# Patient Record
Sex: Female | Born: 1955 | Race: White | Hispanic: No | Smoking: Current every day smoker
Health system: Southern US, Community
[De-identification: ages and names within clinical notes are randomized; demographics above are authoritative.]

## PROBLEM LIST (undated history)

## (undated) DIAGNOSIS — E119 Type 2 diabetes mellitus without complications: Secondary | ICD-10-CM

## (undated) DIAGNOSIS — F329 Major depressive disorder, single episode, unspecified: Secondary | ICD-10-CM

## (undated) DIAGNOSIS — E039 Hypothyroidism, unspecified: Secondary | ICD-10-CM

## (undated) DIAGNOSIS — F32A Depression, unspecified: Secondary | ICD-10-CM

## (undated) DIAGNOSIS — I10 Essential (primary) hypertension: Secondary | ICD-10-CM

## (undated) DIAGNOSIS — I2510 Atherosclerotic heart disease of native coronary artery without angina pectoris: Secondary | ICD-10-CM

## (undated) DIAGNOSIS — I779 Disorder of arteries and arterioles, unspecified: Secondary | ICD-10-CM

## (undated) DIAGNOSIS — J449 Chronic obstructive pulmonary disease, unspecified: Secondary | ICD-10-CM

## (undated) DIAGNOSIS — I739 Peripheral vascular disease, unspecified: Secondary | ICD-10-CM

## (undated) DIAGNOSIS — E785 Hyperlipidemia, unspecified: Secondary | ICD-10-CM

## (undated) HISTORY — DX: Essential (primary) hypertension: I10

## (undated) HISTORY — DX: Major depressive disorder, single episode, unspecified: F32.9

## (undated) HISTORY — DX: Hypothyroidism, unspecified: E03.9

## (undated) HISTORY — DX: Type 2 diabetes mellitus without complications: E11.9

## (undated) HISTORY — DX: Chronic obstructive pulmonary disease, unspecified: J44.9

## (undated) HISTORY — PX: TYMPANOPLASTY: SHX33

## (undated) HISTORY — DX: Atherosclerotic heart disease of native coronary artery without angina pectoris: I25.10

## (undated) HISTORY — PX: CARPAL TUNNEL RELEASE: SHX101

## (undated) HISTORY — DX: Disorder of arteries and arterioles, unspecified: I77.9

## (undated) HISTORY — PX: CYSTECTOMY: SUR359

## (undated) HISTORY — DX: Hyperlipidemia, unspecified: E78.5

## (undated) HISTORY — DX: Peripheral vascular disease, unspecified: I73.9

## (undated) HISTORY — DX: Depression, unspecified: F32.A

---

## 2002-07-18 HISTORY — PX: TOTAL ABDOMINAL HYSTERECTOMY: SHX209

## 2010-01-04 ENCOUNTER — Emergency Department (HOSPITAL_COMMUNITY): Admission: EM | Admit: 2010-01-04 | Discharge: 2010-01-04 | Payer: Self-pay | Admitting: Emergency Medicine

## 2010-01-05 ENCOUNTER — Encounter: Payer: Self-pay | Admitting: Cardiology

## 2010-01-08 ENCOUNTER — Ambulatory Visit: Payer: Self-pay | Admitting: Cardiology

## 2010-08-17 NOTE — Letter (Signed)
Summary: External Correspondence/ [facility]ACT[facility]  External Correspondence/ [facility]   Imported By: Dorise Hiss 01/08/2010 11:03:03  _____________________________________________________________________  External Attachment:    Type:   Image     Comment:   External Document

## 2010-10-03 LAB — COMPREHENSIVE METABOLIC PANEL
ALT: 19 U/L (ref 0–35)
AST: 22 U/L (ref 0–37)
Albumin: 4.1 g/dL (ref 3.5–5.2)
Alkaline Phosphatase: 51 U/L (ref 39–117)
BUN: 18 mg/dL (ref 6–23)
CO2: 23 mEq/L (ref 19–32)
Calcium: 10.4 mg/dL (ref 8.4–10.5)
Chloride: 103 mEq/L (ref 96–112)
Creatinine, Ser: 0.91 mg/dL (ref 0.4–1.2)
GFR calc Af Amer: >60 mL/min (ref >60)
GFR calc non Af Amer: >60 mL/min (ref >60)
Glucose, Bld: 130 mg/dL — ABNORMAL HIGH (ref 70–99)
Potassium: 4.3 mEq/L (ref 3.5–5.1)
Sodium: 137 mEq/L (ref 135–145)
Total Bilirubin: 0.3 mg/dL (ref 0.3–1.2)
Total Protein: 7.4 g/dL (ref 6.0–8.3)

## 2010-10-03 LAB — URINALYSIS, ROUTINE W REFLEX MICROSCOPIC
Bilirubin Urine: NEGATIVE
Glucose, UA: NEGATIVE mg/dL
Ketones, ur: NEGATIVE mg/dL
Leukocytes, UA: NEGATIVE
Nitrite: NEGATIVE
Specific Gravity, Urine: 1.01 (ref 1.005–1.030)
Urobilinogen, UA: 0.2 mg/dL (ref 0.0–1.0)
pH: 5.5 (ref 5.0–8.0)

## 2010-10-03 LAB — CBC
HCT: 35.3 % — ABNORMAL LOW (ref 36.0–46.0)
Hemoglobin: 12 g/dL (ref 12.0–15.0)
MCHC: 33.9 g/dL (ref 30.0–36.0)
MCV: 89.2 fL (ref 78.0–100.0)
Platelets: 299 10*3/uL (ref 150–400)
RBC: 3.96 MIL/uL (ref 3.87–5.11)
RDW: 14 % (ref 11.5–15.5)
WBC: 8.9 10*3/uL (ref 4.0–10.5)

## 2010-10-03 LAB — DIFFERENTIAL
Basophils Absolute: 0 10*3/uL (ref 0.0–0.1)
Basophils Relative: 0 % (ref 0–1)
Eosinophils Absolute: 0.1 10*3/uL (ref 0.0–0.7)
Eosinophils Relative: 1 % (ref 0–5)
Lymphocytes Relative: 39 % (ref 12–46)
Lymphs Abs: 3.5 10*3/uL (ref 0.7–4.0)
Monocytes Absolute: 0.6 10*3/uL (ref 0.1–1.0)
Monocytes Relative: 6 % (ref 3–12)
Neutro Abs: 4.7 10*3/uL (ref 1.7–7.7)
Neutrophils Relative %: 53 % (ref 43–77)

## 2010-10-03 LAB — GLUCOSE, CAPILLARY: Glucose-Capillary: 162 mg/dL — ABNORMAL HIGH (ref 70–99)

## 2010-10-03 LAB — URINE MICROSCOPIC-ADD ON

## 2011-04-28 ENCOUNTER — Other Ambulatory Visit (HOSPITAL_COMMUNITY): Payer: Self-pay | Admitting: Neurology

## 2011-04-28 DIAGNOSIS — I639 Cerebral infarction, unspecified: Secondary | ICD-10-CM

## 2011-05-02 ENCOUNTER — Other Ambulatory Visit (HOSPITAL_COMMUNITY): Payer: Self-pay | Admitting: Radiology

## 2011-05-27 ENCOUNTER — Encounter: Payer: Self-pay | Admitting: *Deleted

## 2011-05-31 ENCOUNTER — Encounter: Payer: Self-pay | Admitting: Cardiology

## 2011-05-31 ENCOUNTER — Ambulatory Visit: Payer: Self-pay | Admitting: Cardiology

## 2011-06-01 ENCOUNTER — Encounter: Payer: Self-pay | Admitting: Cardiology

## 2012-07-18 DIAGNOSIS — I639 Cerebral infarction, unspecified: Secondary | ICD-10-CM

## 2012-07-18 HISTORY — DX: Cerebral infarction, unspecified: I63.9

## 2017-01-15 DIAGNOSIS — R1084 Generalized abdominal pain: Secondary | ICD-10-CM | POA: Insufficient documentation

## 2017-06-19 DIAGNOSIS — F411 Generalized anxiety disorder: Secondary | ICD-10-CM | POA: Insufficient documentation

## 2017-06-19 DIAGNOSIS — F17200 Nicotine dependence, unspecified, uncomplicated: Secondary | ICD-10-CM | POA: Insufficient documentation

## 2017-06-25 DIAGNOSIS — E785 Hyperlipidemia, unspecified: Secondary | ICD-10-CM | POA: Insufficient documentation

## 2017-08-01 DIAGNOSIS — I10 Essential (primary) hypertension: Secondary | ICD-10-CM | POA: Insufficient documentation

## 2017-08-01 DIAGNOSIS — Z681 Body mass index (BMI) 19 or less, adult: Secondary | ICD-10-CM

## 2017-08-01 DIAGNOSIS — K219 Gastro-esophageal reflux disease without esophagitis: Secondary | ICD-10-CM | POA: Insufficient documentation

## 2017-08-01 DIAGNOSIS — E1122 Type 2 diabetes mellitus with diabetic chronic kidney disease: Secondary | ICD-10-CM | POA: Insufficient documentation

## 2017-08-01 DIAGNOSIS — E119 Type 2 diabetes mellitus without complications: Secondary | ICD-10-CM | POA: Insufficient documentation

## 2018-05-08 DIAGNOSIS — M533 Sacrococcygeal disorders, not elsewhere classified: Secondary | ICD-10-CM | POA: Insufficient documentation

## 2018-05-08 DIAGNOSIS — R300 Dysuria: Secondary | ICD-10-CM | POA: Insufficient documentation

## 2018-05-08 DIAGNOSIS — M549 Dorsalgia, unspecified: Secondary | ICD-10-CM | POA: Insufficient documentation

## 2018-06-27 ENCOUNTER — Ambulatory Visit (INDEPENDENT_AMBULATORY_CARE_PROVIDER_SITE_OTHER): Payer: Medicare HMO | Admitting: Orthopaedic Surgery

## 2018-06-27 ENCOUNTER — Ambulatory Visit (INDEPENDENT_AMBULATORY_CARE_PROVIDER_SITE_OTHER): Payer: Medicare HMO

## 2018-06-27 ENCOUNTER — Encounter (INDEPENDENT_AMBULATORY_CARE_PROVIDER_SITE_OTHER): Payer: Self-pay | Admitting: Orthopaedic Surgery

## 2018-06-27 VITALS — BP 122/82 | HR 108 | Ht 63.5 in | Wt 99.0 lb

## 2018-06-27 DIAGNOSIS — G8929 Other chronic pain: Secondary | ICD-10-CM | POA: Diagnosis not present

## 2018-06-27 DIAGNOSIS — M5442 Lumbago with sciatica, left side: Secondary | ICD-10-CM | POA: Diagnosis not present

## 2018-06-27 NOTE — Progress Notes (Signed)
Office Visit Note   Patient: Ashlee Austin           Date of Birth: Sep 11, 1955           MRN: [id number] Visit Date: 06/27/2018              Requested by: Celedonio Savage, MD [address] [place], [place] [address] PCP: Celedonio Savage, MD   Assessment & Plan: Visit Diagnoses:  1. Chronic bilateral low back pain with left-sided sciatica     Plan: Chronic low back pain with evidence of left lower extremity radiculopathy will obtain an MRI scan turn to the office afterwards for consideration of treatment  Follow-Up Instructions: Return after MRI L-S spine.   Orders:  Orders Placed This Encounter  Procedures  . XR Lumbar Spine 2-3 Views   No orders of the defined types were placed in this encounter.     Procedures: No procedures performed   Clinical Data: No additional findings.   Subjective: Chief Complaint  Patient presents with  . Left Hip - Pain  . Lower Back - Pain    Left SI joint pain  Ashlee Austin visits the office with a long history of low back pain associated with left lower extremity radiculopathy.  She is had trouble for "years".  She is been treated with prednisone Flexeril for amatory medicines she notes that her pain originates in the lower lumbar spine to the left and they radiates into her left leg as far distally as her ankle and her foot.  She has been a type II diabetic for years tries to maintain good control.  She has not had any problem with her right lower extremity.  Office notes from her primary care physician are reviewed  HPI  Review of Systems   Objective: Vital Signs: BP 122/82   Pulse (!) 108   Ht 5' 3.5" (1.613 m)   Wt 99 lb (44.9 kg)   BMI 17.26 kg/m   Physical Exam  Constitutional: She is oriented to person, place, and time. She appears well-developed and well-nourished.  HENT:  Mouth/Throat: Oropharynx is clear and moist.  Eyes: Pupils are equal, round, and reactive to light. EOM are normal.  Pulmonary/Chest: Effort normal.    Neurological: She is alert and oriented to person, place, and time.  Skin: Skin is warm and dry.  Psychiatric: She has a normal mood and affect. Her behavior is normal.    Ortho Exam awake alert and oriented x3.  Straight leg raise with some minimal back discomfort.  No leg pain.  Reflexes were symmetrical.  Motor exam intact.  Painless range of motion both hips and both knees.  Has an area of tenderness the left paralumbar region the area of the sacroiliac joint but skin was intact no percussible tenderness of the lumbar spine.  Some pain with forward flexion extension of the lumbar spine but no referred pain to either extremity with that maneuver  Specialty Comments:  No specialty comments available.  Imaging: Xr Lumbar Spine 2-3 Views  Result Date: 06/27/2018 Films of the lumbar spine were obtained in the AP and lateral projection.  Considerable bowel gas obliterate some of the detail.  Diffuse calcification of the abdominal aorta without obvious aneurysmal dilatation.  Very slight anterior listhesis of L4 on L5.  Disc spaces are well-maintained.  Some facet sclerosis at L4-5 and L5-S1 minimal right-sided curvature in the lower lumbar spine probably degenerative in nature    PMFS History: Patient Active  Problem List   Diagnosis Date Noted  . Chronic bilateral low back pain with left-sided sciatica 06/27/2018   Past Medical History:  Diagnosis Date  . Carotid artery disease (HCC)    Mild - nonobstructive 10/12  . COPD (chronic obstructive pulmonary disease) ([place])   . Coronary atherosclerosis of native coronary artery    [facility]; negative Dobutamine echo 6/11  . Depression   . Essential hypertension, benign   . Hyperlipidemia   . Hypothyroidism   . Type 2 diabetes mellitus (HCC)     Family History  Problem Relation Age of Onset  . Coronary artery disease Mother        MI age 51  . Lung cancer Father     Past Surgical History:  Procedure Laterality Date  . CYSTECTOMY      Spine  . TOTAL ABDOMINAL HYSTERECTOMY  2004  . TYMPANOPLASTY     Social History   Occupational History    Employer: [employer]    Comment: Full time at [employer]  Tobacco Use  . Smoking status: Current Every Day Smoker    Packs/day: 1.00    Years: 16.00    Pack years: 16.00    Types: Cigarettes  . Smokeless tobacco: Never Used  Substance and Sexual Activity  . Alcohol use: No  . Drug use: No  . Sexual activity: Not on file     Garald Balding, MD   Note - This record has been created using [employer].  Chart creation errors have been sought, but may not always  have been located. Such creation errors do not reflect on  the standard of medical care.

## 2018-06-27 NOTE — Addendum Note (Signed)
Addended by: Meyer Cory on: 06/27/2018 02:55 PM   Modules accepted: Orders

## 2018-06-27 NOTE — Progress Notes (Deleted)
   Office Visit Note   Patient: Ashlee Austin           Date of Birth: 08-Sep-1955           MRN: [id number] Visit Date: 06/27/2018              Requested by: Celedonio Savage, MD [address] [place], [place] [address] PCP: Celedonio Savage, MD   Assessment & Plan: Visit Diagnoses:  1. Chronic bilateral low back pain with left-sided sciatica     Plan: ***  Follow-Up Instructions: No follow-ups on file.   Orders:  Orders Placed This Encounter  Procedures  . XR Lumbar Spine 2-3 Views   No orders of the defined types were placed in this encounter.     Procedures: No procedures performed   Clinical Data: No additional findings.   Subjective: Chief Complaint  Patient presents with  . Left Hip - Pain  . Lower Back - Pain    Left SI joint pain  Patient presents with left anterior thigh pain and left SI joint pain. She states this is a chronic pain and is about 8/10 on the pain scale. She describes it as continuous.  She has no known injury. She is taking naproxen and flexeril. She also tries hot baths, ben gay, heat, and exercises.  HPI  Review of Systems   Objective: Vital Signs: BP 122/82   Pulse (!) 108   Ht 5' 3.5" (1.613 m)   Wt 99 lb (44.9 kg)   BMI 17.26 kg/m   Physical Exam  Ortho Exam  Specialty Comments:  No specialty comments available.  Imaging: No results found.   PMFS History: Patient Active Problem List   Diagnosis Date Noted  . Chronic bilateral low back pain with left-sided sciatica 06/27/2018   Past Medical History:  Diagnosis Date  . Carotid artery disease (HCC)    Mild - nonobstructive 10/12  . COPD (chronic obstructive pulmonary disease) ([place])   . Coronary atherosclerosis of native coronary artery    [facility]; negative Dobutamine echo 6/11  . Depression   . Essential hypertension, benign   . Hyperlipidemia   . Hypothyroidism   . Type 2 diabetes mellitus (HCC)     Family History  Problem Relation Age of Onset  . Lung  cancer Father   . Coronary artery disease Mother        MI age 60    Past Surgical History:  Procedure Laterality Date  . CYSTECTOMY     Spine  . TOTAL ABDOMINAL HYSTERECTOMY  2004  . TYMPANOPLASTY     Social History   Occupational History    Employer: [employer]    Comment: Full time at [employer]  Tobacco Use  . Smoking status: Current Every Day Smoker    Packs/day: 1.00    Years: 16.00    Pack years: 16.00    Types: Cigarettes  . Smokeless tobacco: Never Used  Substance and Sexual Activity  . Alcohol use: No  . Drug use: No  . Sexual activity: Not on file

## 2018-07-03 ENCOUNTER — Encounter: Payer: Self-pay | Admitting: Orthopaedic Surgery

## 2018-07-05 ENCOUNTER — Ambulatory Visit (INDEPENDENT_AMBULATORY_CARE_PROVIDER_SITE_OTHER): Payer: Medicare HMO | Admitting: Orthopaedic Surgery

## 2018-07-05 ENCOUNTER — Encounter (INDEPENDENT_AMBULATORY_CARE_PROVIDER_SITE_OTHER): Payer: Self-pay | Admitting: Orthopaedic Surgery

## 2018-07-05 VITALS — BP 108/70 | HR 104 | Ht 63.5 in | Wt 99.0 lb

## 2018-07-05 DIAGNOSIS — M5126 Other intervertebral disc displacement, lumbar region: Secondary | ICD-10-CM | POA: Diagnosis not present

## 2018-07-05 DIAGNOSIS — M5442 Lumbago with sciatica, left side: Secondary | ICD-10-CM

## 2018-07-05 DIAGNOSIS — G8929 Other chronic pain: Secondary | ICD-10-CM | POA: Diagnosis not present

## 2018-07-05 NOTE — Progress Notes (Signed)
Office Visit Note   Patient: Ashlee Austin           Date of Birth: [birth year]           MRN: [id number] Visit Date: 07/05/2018              Requested by: Celedonio Savage, MD [address] [place], [place] [address] PCP: Celedonio Savage, MD   Assessment & Plan: Visit Diagnoses:  1. Chronic bilateral low back pain with left-sided sciatica   2. Protrusion of lumbar intervertebral disc     Plan: I reviewed the scan with the patient most prominent is L4-5 left extraforaminal protrusion.  We will set up for epidural at that level and possibly the next level of L3-4 with Dr. Ernestina Patches.  I will check her back again in 4 weeks.  Follow-Up Instructions: Return in about 4 weeks (around 08/02/2018).   Orders:  Orders Placed This Encounter  Procedures  . Ambulatory referral to Physical Medicine Rehab   No orders of the defined types were placed in this encounter.     Procedures: No procedures performed   Clinical Data: No additional findings.   Subjective: Chief Complaint  Patient presents with  . Lower Back - Follow-up    MRI Lumbar Review    HPI 62 year old female with low back pain and left leg pain who saw Dr. Durward Fortes on 06/27/2018 and MRI scan is been obtained.  He is had a long history of low back pain and left lower extremity pain for several years.  She is been treated with muscle relaxants Flexeril, anti-inflammatories, prednisone without relief.  She is a type II diabetic is tried to maintain good control.  No right lower extremity symptoms no bowel bladder symptoms associated.  Review of Systems positive history of smoking, hypercholesterolemia, diabetes, anxiety, hypertension.  Thyroid supplement.  14 point review of systems otherwise negative is obtains HPI.   Objective: Vital Signs: BP 108/70   Pulse (!) 104   Ht 5' 3.5" (1.613 m)   Wt 99 lb (44.9 kg)   BMI 17.26 kg/m   Physical Exam Constitutional:      Appearance: She is well-developed.  HENT:     Head:  Normocephalic.     Right Ear: External ear normal.     Left Ear: External ear normal.  Eyes:     Pupils: Pupils are equal, round, and reactive to light.  Neck:     Thyroid: No thyromegaly.     Trachea: No tracheal deviation.  Cardiovascular:     Rate and Rhythm: Normal rate.  Pulmonary:     Effort: Pulmonary effort is normal.  Abdominal:     Palpations: Abdomen is soft.  Skin:    General: Skin is warm and dry.  Neurological:     Mental Status: She is alert and oriented to person, place, and time.  Psychiatric:        Behavior: Behavior normal.     Ortho Exam patient has some discomfort straight leg raising on the left side negative on the right.  Knee and ankle jerk are 1+ and symmetrical.  Negative logroll to the hips knees reach full extension good quad strength anterior tib gastrocsoleus is strong she is able to heel and toe walk.  Pain with forward flexion lumbar spine without radicular symptoms.  No sciatic notch tenderness on the right side.  Specialty Comments:  No specialty comments available.  Imaging: 07/03/2018 shows right small extraforaminal disc protrusions at L2-3 and  L3-4.  More prominent extraforaminal protrusion at left L4-5 with subarticular stenosis.  No central stenosis.  Mild facet degenerative changes noted.   PMFS History: Patient Active Problem List   Diagnosis Date Noted  . Chronic bilateral low back pain with left-sided sciatica 06/27/2018   Past Medical History:  Diagnosis Date  . Carotid artery disease (HCC)    Mild - nonobstructive 10/12  . COPD (chronic obstructive pulmonary disease) ([place])   . Coronary atherosclerosis of native coronary artery    [facility]; negative Dobutamine echo 6/11  . Depression   . Essential hypertension, benign   . Hyperlipidemia   . Hypothyroidism   . Type 2 diabetes mellitus (HCC)     Family History  Problem Relation Age of Onset  . Coronary artery disease Mother        MI age 43  . Lung cancer Father       Past Surgical History:  Procedure Laterality Date  . CYSTECTOMY     Spine  . TOTAL ABDOMINAL HYSTERECTOMY  2004  . TYMPANOPLASTY     Social History   Occupational History    Employer: [employer]    Comment: Full time at [employer]  Tobacco Use  . Smoking status: Current Every Day Smoker    Packs/day: 1.00    Years: 16.00    Pack years: 16.00    Types: Cigarettes  . Smokeless tobacco: Never Used  Substance and Sexual Activity  . Alcohol use: No  . Drug use: No  . Sexual activity: Not on file

## 2018-07-06 ENCOUNTER — Encounter (INDEPENDENT_AMBULATORY_CARE_PROVIDER_SITE_OTHER): Payer: Self-pay | Admitting: Orthopaedic Surgery

## 2018-07-06 DIAGNOSIS — M5126 Other intervertebral disc displacement, lumbar region: Secondary | ICD-10-CM | POA: Insufficient documentation

## 2018-08-01 ENCOUNTER — Ambulatory Visit (INDEPENDENT_AMBULATORY_CARE_PROVIDER_SITE_OTHER): Payer: Medicare HMO | Admitting: Orthopaedic Surgery

## 2018-09-17 DIAGNOSIS — B029 Zoster without complications: Secondary | ICD-10-CM | POA: Insufficient documentation

## 2019-06-10 DIAGNOSIS — R439 Unspecified disturbances of smell and taste: Secondary | ICD-10-CM | POA: Insufficient documentation

## 2019-06-10 DIAGNOSIS — R630 Anorexia: Secondary | ICD-10-CM | POA: Insufficient documentation

## 2019-06-10 DIAGNOSIS — J0100 Acute maxillary sinusitis, unspecified: Secondary | ICD-10-CM | POA: Insufficient documentation

## 2019-10-04 DIAGNOSIS — R1013 Epigastric pain: Secondary | ICD-10-CM | POA: Insufficient documentation

## 2019-10-25 DIAGNOSIS — I639 Cerebral infarction, unspecified: Secondary | ICD-10-CM | POA: Insufficient documentation

## 2019-10-25 DIAGNOSIS — N3090 Cystitis, unspecified without hematuria: Secondary | ICD-10-CM | POA: Insufficient documentation

## 2019-10-30 ENCOUNTER — Telehealth: Payer: Self-pay

## 2019-10-30 NOTE — Telephone Encounter (Signed)
Pt called checking status of referral from Dr. Jerre Simon to [facility]. Pt states it was sent yesterday. Informed pt to allow time for provider to review & the office will call to schedule appt once confirmed with neurologist.  Verified pt ph [phone].

## 2019-11-04 ENCOUNTER — Encounter: Payer: Self-pay | Admitting: Neurology

## 2019-11-12 ENCOUNTER — Encounter: Payer: Self-pay | Admitting: *Deleted

## 2019-11-13 ENCOUNTER — Encounter: Payer: Self-pay | Admitting: Cardiology

## 2019-11-13 ENCOUNTER — Other Ambulatory Visit: Payer: Self-pay

## 2019-11-13 ENCOUNTER — Ambulatory Visit (INDEPENDENT_AMBULATORY_CARE_PROVIDER_SITE_OTHER): Payer: Medicare HMO | Admitting: Cardiology

## 2019-11-13 ENCOUNTER — Telehealth: Payer: Self-pay | Admitting: Cardiology

## 2019-11-13 VITALS — BP 110/64 | HR 90 | Ht 62.0 in | Wt 103.4 lb

## 2019-11-13 DIAGNOSIS — E782 Mixed hyperlipidemia: Secondary | ICD-10-CM | POA: Diagnosis not present

## 2019-11-13 DIAGNOSIS — Z8673 Personal history of transient ischemic attack (TIA), and cerebral infarction without residual deficits: Secondary | ICD-10-CM | POA: Diagnosis not present

## 2019-11-13 DIAGNOSIS — F17201 Nicotine dependence, unspecified, in remission: Secondary | ICD-10-CM | POA: Diagnosis not present

## 2019-11-13 NOTE — Telephone Encounter (Signed)
Report of recent ZIO AT reviewed.  13 days 19 hours analyzed.  Predominant rhythm is sinus with heart rate ranging from 63 bpm up to 146 bpm and average heart rate 90 bpm.  Rare atrial and ventricular ectopy noted representing less than 1% of total beats.  Two bursts of NSVT were noted.  Importantly, there was no atrial fibrillation or atrial flutter.  Please refer to consultation note from today.  In the absence of atrial arrhythmias, no change in current therapy.  She should keep scheduled follow-up with Neurology and does not need any further cardiac testing at this time.

## 2019-11-13 NOTE — Patient Instructions (Addendum)
Medication Instructions:    Your physician recommends that you continue on your current medications as directed. Please refer to the Current Medication list given to you today.  Labwork:  NONE  Testing/Procedures:  NONE  Follow-Up:  Your physician recommends that you schedule a follow-up appointment in: pending  Any Other Special Instructions Will Be Listed Below (If Applicable).  We will contact you once we are able to review your heart monitor.  If you need a refill on your cardiac medications before your next appointment, please call your pharmacy.

## 2019-11-13 NOTE — Progress Notes (Signed)
Cardiology Office Note  Date: 11/13/2019   ID: Ashlee Austin, Ashlee Austin 04/08/1956, MRN [id number]  PCP:  Ashlee Kaufman, PA-C  Cardiologist:  Ashlee Lesches, MD Electrophysiologist:  None   Chief Complaint  Patient presents with  . History of stroke    History of Present Illness: Ashlee Austin is a 64 y.o. female referred for cardiology consultation by [name] PA-C.  Records indicate hospitalization at [name] in early April with an acute stroke as discussed below.  Head CT reported no acute hemorrhage with age-indeterminate small vessel infarct of the left corona radiata and chronic microvascular changes.  Brain MRI demonstrated an acute 9 mm infarct within the left pons, extensive chronic small vessel changes throughout the pons and in the cerebral hemispheric white matter.  CTA of the head and neck did not demonstrate any large vessel occlusion or hemodynamically significant stenoses.  I personally reviewed the tracing from April 2 which showed normal sinus rhythm.  I also reviewed the echocardiogram report from April 5 indicating normal LVEF at 50 to 55% with mild diastolic dysfunction, negative agitated saline study for interatrial shunting.  She presents today stating that she is back to baseline, does not report any trouble with her speech or movement.  She states that she has been compliant with her medications, no obvious intolerances or bleeding problems.  She states that she had a bleeding ulcer in the past and stopped aspirin but has continued on Plavix.  Also tolerating Lipitor.  She also has stopped smoking.  She does not describe any sense of palpitations, no dizziness or syncope.  In talking with her she tells me that she had a cardiac monitor placed when she was discharged from the hospital, wore this for 2 weeks, and recently turned it in.  I do not have any information about who ordered it or what the results are at this time.  It looks like she has  been referred to [facility] per chart review.  Past Medical History:  Diagnosis Date  . Carotid artery disease ([place])   . COPD (chronic obstructive pulmonary disease) ([place])   . Coronary atherosclerosis of native coronary artery    [facility]; negative Dobutamine echo 6/11  . Depression   . Essential hypertension   . Hyperlipidemia   . Hypothyroidism   . Type 2 diabetes mellitus ([place])     Past Surgical History:  Procedure Laterality Date  . CARPAL TUNNEL RELEASE    . CYSTECTOMY     Spine  . TOTAL ABDOMINAL HYSTERECTOMY  2004  . TYMPANOPLASTY      Current Outpatient Medications  Medication Sig Dispense Refill  . ALPRAZolam (XANAX) 1 MG tablet Take 1 mg by mouth at bedtime.     . ASPIRIN LOW DOSE 81 MG EC tablet Take 81 mg by mouth daily.    Marland Kitchen atorvastatin (LIPITOR) 40 MG tablet Take 40 mg by mouth daily.  1  . Calcium Carbonate-Vitamin D (CALTRATE 600+D) 600-400 MG-UNIT per tablet Take 2 tablets by mouth daily.      . citalopram (CELEXA) 40 MG tablet Take 40 mg by mouth daily.      . clopidogrel (PLAVIX) 75 MG tablet Take 75 mg by mouth daily.    Marland Kitchen lisinopril (PRINIVIL,ZESTRIL) 10 MG tablet Take 10 mg by mouth daily.  0  . metFORMIN (GLUCOPHAGE) 1000 MG tablet Take 1,000 mg by mouth 2 (two) times daily with a meal.      No current facility-administered  medications for this visit.   Allergies:  Patient has no known allergies.   Social History: The patient  reports that she quit smoking about 3 weeks ago. Her smoking use included cigarettes. She started smoking about 47 years ago. She has a 16.00 pack-year smoking history. She has never used smokeless tobacco. She reports that she does not drink alcohol or use drugs.   Family History: The patient's family history includes Coronary artery disease in her mother; Lung cancer in her father.   ROS:  No palpitations or syncope.  Physical Exam: VS:  BP 110/64 (BP Location: Left Arm, Cuff Size: Normal)   Pulse 90   Ht 5\' 2"   (1.575 m)   Wt 103 lb 6.4 oz (46.9 kg)   SpO2 98%   BMI 18.91 kg/m , BMI Body mass index is 18.91 kg/m.  Wt Readings from Last 3 Encounters:  11/13/19 103 lb 6.4 oz (46.9 kg)  07/05/18 99 lb (44.9 kg)  06/27/18 99 lb (44.9 kg)    General: Patient appears comfortable at rest. HEENT: Conjunctiva and lids normal, wearing a mask. Neck: Supple, no elevated JVP or carotid bruits, no thyromegaly. Lungs: Clear to auscultation, nonlabored breathing at rest. Cardiac: Regular rate and rhythm, no S3 or significant systolic murmur, no pericardial rub. Abdomen: Soft, bowel sounds present. Extremities: No pitting edema, distal pulses 2+. Skin: Warm and dry. Musculoskeletal: No kyphosis. Neuropsychiatric: Alert and oriented x3, affect grossly appropriate.  ECG:  An ECG dated 10/04/2019 was personally reviewed today and demonstrated:  Normal sinus rhythm.  Recent Labwork:  March 2021: BUN 16, creatinine 0.72, potassium 4.6, AST 12, ALT 7, hemoglobin A1c 6.9%, cholesterol 132, triglycerides 290, HDL 48, LDL 40, TSH 1.48, hemoglobin 11.5, platelets 309 April 2021: Hemoglobin A1c 7%, TSH 2.04  Other Studies Reviewed Today:  Echocardiogram 10/21/2019 [name]): Summary  1. The left ventricle is normal in size with normal wall thickness.  2. The left ventricular systolic function is normal, LVEF is visually  estimated at 50-55%.  3. There is grade I diastolic dysfunction (impaired relaxation).  4. The aortic valve is trileaflet with mildly thickened leaflets with normal  excursion.  5. The right ventricle is normal in size, with normal systolic function.  6. Agitated saline study is negative.  Assessment and Plan:  1.  Recent acute stroke involving the left pons with evaluation at [facility] as noted above.  Not clearly embolic based on description, she had no definite arrhythmias under observation, echocardiogram showed low normal LVEF and no obvious intra-atrial  shunting by agitated saline contrast.  No obstructive carotid artery disease.  We will attempt to find the results of her outpatient cardiac monitor that was arranged at discharge (details not clear as yet).  For now I suggested that she continue on Plavix and keep pending outpatient neurology assessment.  Also continue statin therapy.  2.  Longstanding tobacco abuse.  She states that she has quit smoking since hospital discharge.  3.  Mixed hyperlipidemia by history, now on Lipitor.  Recent LDL 70.  Medication Adjustments/Labs and Tests Ordered: Current medicines are reviewed at length with the patient today.  Concerns regarding medicines are outlined above.   Tests Ordered: No orders of the defined types were placed in this encounter.   Medication Changes: No orders of the defined types were placed in this encounter.   Disposition:  Follow up cardiac monitor results when available.  Signed, Satira Sark, MD, [facility] 11/13/2019 1:53 PM    [facility] [place], [place], [place] [address] Phone: [phone]; Fax: [phone]

## 2019-11-14 NOTE — Telephone Encounter (Signed)
Patient informed. Copy sent to PCP °

## 2020-01-09 ENCOUNTER — Ambulatory Visit: Payer: Self-pay | Admitting: Neurology

## 2020-01-27 DIAGNOSIS — R102 Pelvic and perineal pain: Secondary | ICD-10-CM | POA: Insufficient documentation

## 2020-01-27 DIAGNOSIS — Z8673 Personal history of transient ischemic attack (TIA), and cerebral infarction without residual deficits: Secondary | ICD-10-CM | POA: Insufficient documentation

## 2020-08-26 ENCOUNTER — Encounter: Payer: Self-pay | Admitting: Internal Medicine

## 2020-09-24 ENCOUNTER — Encounter: Payer: Self-pay | Admitting: *Deleted

## 2020-09-24 ENCOUNTER — Other Ambulatory Visit: Payer: Self-pay | Admitting: *Deleted

## 2020-09-24 ENCOUNTER — Telehealth: Payer: Self-pay | Admitting: *Deleted

## 2020-09-24 ENCOUNTER — Other Ambulatory Visit: Payer: Self-pay

## 2020-09-24 ENCOUNTER — Encounter: Payer: Self-pay | Admitting: Internal Medicine

## 2020-09-24 ENCOUNTER — Ambulatory Visit: Payer: Medicare HMO | Admitting: Internal Medicine

## 2020-09-24 VITALS — BP 126/85 | HR 101 | Temp 97.1°F | Ht 64.0 in | Wt 93.8 lb

## 2020-09-24 DIAGNOSIS — Z1211 Encounter for screening for malignant neoplasm of colon: Secondary | ICD-10-CM | POA: Diagnosis not present

## 2020-09-24 DIAGNOSIS — R110 Nausea: Secondary | ICD-10-CM

## 2020-09-24 DIAGNOSIS — R1013 Epigastric pain: Secondary | ICD-10-CM

## 2020-09-24 MED ORDER — PROMETHAZINE HCL 12.5 MG PO TABS: 12.5000 mg | ORAL_TABLET | Freq: Three times a day (TID) | ORAL | 1 refills | Status: DC | PRN

## 2020-09-24 MED ORDER — PEG 3350-KCL-NA BICARB-NACL 420 G PO SOLR: ORAL | 0 refills | Status: DC

## 2020-09-24 NOTE — Telephone Encounter (Signed)
Called pt and is aware of pre-op/covid test appt details.

## 2020-09-24 NOTE — Patient Instructions (Signed)
We will schedule you for EGD to further evaluate your nausea and epigastric pain.  At the same time we will perform colonoscopy for colon cancer screening purposes.  I have sent in prescription for promethazine for nausea.  This may make you sleepy so be careful.  If EGD and colonoscopy unremarkable, we will consider further evaluation with CT imaging.  At [facility] we value your feedback. You may receive a survey about your visit today. Please share your experience as we strive to create trusting relationships with our patients to provide genuine, compassionate, quality care.  We appreciate your understanding and patience as we review any laboratory studies, imaging, and other diagnostic tests that are ordered as we care for you. Our office policy is 5 business days for review of these results, and any emergent or urgent results are addressed in a timely manner for your best interest. If you do not hear from our office in 1 week, please contact us.   We also encourage the use of MyChart, which contains your medical information for your review as well. If you are not enrolled in this feature, an access code is on this after visit summary for your convenience. Thank you for allowing [place] to be involved in your care.  It was great to see you today!  I hope you have a great rest of your spring!!    Elon Alas. Abbey Chatters, D.O. Gastroenterology and Hepatology [facility]

## 2020-09-24 NOTE — H&P (View-Only) (Signed)
Primary Care Physician:  Rosine Door Primary Gastroenterologist:  Dr. Abbey Chatters  Chief Complaint  Patient presents with  . Nausea    X2 months. Seldom vomiting. Unable to eat much d/t causes nausea  . Bloated  . belching  . Abdominal Pain    Sometimes has sharp pains    HPI:   Ashlee Austin is a 65 y.o. female who presents to clinic today by referral from her PCP Dr. Quillian Quince for evaluation.  Patient states for the last 2 to 3 months she has had epigastric pain that is intermittent in nature.  Does not radiate.  Does notice it is severe at times.  Also has nausea with occasional vomiting.  States she is taken Zofran for this without any relief.  Also notes weight loss with a BMI of 16.  No NSAID use.  No history of PUD or H. pylori that she knows of.    Last colonoscopy greater than 10 years ago.  Denies any family history of colorectal malignancy.  No melena hematochezia.  Does note weight loss as above.  Past Medical History:  Diagnosis Date  . Carotid artery disease ([place])   . COPD (chronic obstructive pulmonary disease) ([place])   . Coronary atherosclerosis of native coronary artery    [facility]; negative Dobutamine echo 6/11  . Depression   . Essential hypertension   . Hyperlipidemia   . Hypothyroidism   . Type 2 diabetes mellitus ([place])     Past Surgical History:  Procedure Laterality Date  . CARPAL TUNNEL RELEASE    . CYSTECTOMY     Spine  . TOTAL ABDOMINAL HYSTERECTOMY  2004  . TYMPANOPLASTY      Current Outpatient Medications  Medication Sig Dispense Refill  . ALPRAZolam (XANAX) 1 MG tablet Take 1 mg by mouth at bedtime.     Marland Kitchen atorvastatin (LIPITOR) 40 MG tablet Take 40 mg by mouth daily.  1  . cholestyramine (QUESTRAN) 4 GM/DOSE powder Take 4 g by mouth daily. Sometimes doesn't take everyday    . citalopram (CELEXA) 40 MG tablet Take 40 mg by mouth daily.    . clopidogrel (PLAVIX) 75 MG tablet Take 75 mg by mouth daily.    Marland Kitchen lisinopril  (PRINIVIL,ZESTRIL) 10 MG tablet Take 10 mg by mouth daily.  0  . metFORMIN (GLUCOPHAGE) 1000 MG tablet Take 1,000 mg by mouth 2 (two) times daily with a meal.    . Probiotic Product (PROBIOTIC PO) Take by mouth daily.    . ASPIRIN LOW DOSE 81 MG EC tablet Take 81 mg by mouth daily. (Patient not taking: Reported on 09/24/2020)    . Calcium Carbonate-Vitamin D 600-400 MG-UNIT tablet Take 2 tablets by mouth daily.   (Patient not taking: Reported on 09/24/2020)     No current facility-administered medications for this visit.    Allergies as of 09/24/2020  . (No Known Allergies)    Family History  Problem Relation Age of Onset  . Coronary artery disease Mother        MI age 25  . Lung cancer Father     Social History   Socioeconomic History  . Marital status: Widowed    Spouse name: Not on file  . Number of children: Not on file  . Years of education: Not on file  . Highest education level: Not on file  Occupational History    Employer: [employer]    Comment: Full time at [place] Use  .  Smoking status: Current Every Day Smoker    Packs/day: 1.00    Years: 16.00    Pack years: 16.00    Types: Cigarettes    Start date: 01/31/1972    Last attempt to quit: 10/17/2019    Years since quitting: 0.9  . Smokeless tobacco: Never Used  Substance and Sexual Activity  . Alcohol use: No  . Drug use: No  . Sexual activity: Not on file  Other Topics Concern  . Not on file  Social History Narrative  . Not on file   Social Determinants of Health   Financial Resource Strain: Not on file  Food Insecurity: Not on file  Transportation Needs: Not on file  Physical Activity: Not on file  Stress: Not on file  Social Connections: Not on file  Intimate Partner Violence: Not on file    Subjective: Review of Systems  Constitutional: Negative for chills and fever.  HENT: Negative for congestion and hearing loss.   Eyes: Negative for blurred vision and double vision.   Respiratory: Negative for cough and shortness of breath.   Cardiovascular: Negative for chest pain and palpitations.  Gastrointestinal: Positive for abdominal pain and nausea. Negative for blood in stool, constipation, diarrhea, heartburn, melena and vomiting.  Genitourinary: Negative for dysuria and urgency.  Musculoskeletal: Negative for joint pain and myalgias.  Skin: Negative for itching and rash.  Neurological: Negative for dizziness and headaches.  Psychiatric/Behavioral: Negative for depression. The patient is not nervous/anxious.        Objective: BP 126/85   Pulse (!) 101   Temp (!) 97.1 F (36.2 C) (Temporal)   Ht 5\' 4"  (1.626 m)   Wt 93 lb 12.8 oz (42.5 kg)   BMI 16.10 kg/m  Physical Exam Constitutional:      Appearance: Normal appearance.     Comments: Patient appears underweight  HENT:     Head: Normocephalic and atraumatic.  Eyes:     Extraocular Movements: Extraocular movements intact.     Conjunctiva/sclera: Conjunctivae normal.  Cardiovascular:     Rate and Rhythm: Normal rate and regular rhythm.  Pulmonary:     Effort: Pulmonary effort is normal.     Breath sounds: Normal breath sounds.  Abdominal:     General: Bowel sounds are normal.     Palpations: Abdomen is soft.  Musculoskeletal:        General: No swelling. Normal range of motion.     Cervical back: Normal range of motion and neck supple.  Skin:    General: Skin is warm and dry.     Coloration: Skin is not jaundiced.  Neurological:     General: No focal deficit present.     Mental Status: She is alert and oriented to person, place, and time.  Psychiatric:        Mood and Affect: Mood normal.        Behavior: Behavior normal.      Assessment: *Epigastric pain *Nausea and vomiting *Weight loss *Colon cancer screen  Plan: Etiology of patient's epigastric pain and nausea unclear. Will schedule for EGD to evaluate for peptic ulcer disease, esophagitis, gastritis, H. Pylori,  duodenitis, or other. Will also evaluate for esophageal stricture, Schatzki's ring, esophageal web or other.   At the same time we will perform colonoscopy for colon cancer screening purposes.  The risks including infection, bleed, or perforation as well as benefits, limitations, alternatives and imponderables have been reviewed with the patient. Potential for esophageal dilation, biopsy, etc. have also  been reviewed.  Questions have been answered. All parties agreeable.  For her nausea I will send in promethazine to take as needed as Zofran is not working.  Thank you Dr. Quillian Quince for the kind referral.  Further recommendations to follow   09/24/2020 9:38 AM   Disclaimer: This note was dictated with voice recognition software. Similar sounding words can inadvertently be transcribed and may not be corrected upon review.

## 2020-09-24 NOTE — Progress Notes (Signed)
Primary Care Physician:  Rosine Door Primary Gastroenterologist:  Dr. Abbey Chatters  Chief Complaint  Patient presents with  . Nausea    X2 months. Seldom vomiting. Unable to eat much d/t causes nausea  . Bloated  . belching  . Abdominal Pain    Sometimes has sharp pains    HPI:   Ashlee Austin is a 65 y.o. female who presents to clinic today by referral from her PCP Dr. Quillian Quince for evaluation.  Patient states for the last 2 to 3 months she has had epigastric pain that is intermittent in nature.  Does not radiate.  Does notice it is severe at times.  Also has nausea with occasional vomiting.  States she is taken Zofran for this without any relief.  Also notes weight loss with a BMI of 16.  No NSAID use.  No history of PUD or H. pylori that she knows of.    Last colonoscopy greater than 10 years ago.  Denies any family history of colorectal malignancy.  No melena hematochezia.  Does note weight loss as above.  Past Medical History:  Diagnosis Date  . Carotid artery disease ([place])   . COPD (chronic obstructive pulmonary disease) ([place])   . Coronary atherosclerosis of native coronary artery    [facility]; negative Dobutamine echo 6/11  . Depression   . Essential hypertension   . Hyperlipidemia   . Hypothyroidism   . Type 2 diabetes mellitus ([place])     Past Surgical History:  Procedure Laterality Date  . CARPAL TUNNEL RELEASE    . CYSTECTOMY     Spine  . TOTAL ABDOMINAL HYSTERECTOMY  2004  . TYMPANOPLASTY      Current Outpatient Medications  Medication Sig Dispense Refill  . ALPRAZolam (XANAX) 1 MG tablet Take 1 mg by mouth at bedtime.     Marland Kitchen atorvastatin (LIPITOR) 40 MG tablet Take 40 mg by mouth daily.  1  . cholestyramine (QUESTRAN) 4 GM/DOSE powder Take 4 g by mouth daily. Sometimes doesn't take everyday    . citalopram (CELEXA) 40 MG tablet Take 40 mg by mouth daily.    . clopidogrel (PLAVIX) 75 MG tablet Take 75 mg by mouth daily.    Marland Kitchen lisinopril  (PRINIVIL,ZESTRIL) 10 MG tablet Take 10 mg by mouth daily.  0  . metFORMIN (GLUCOPHAGE) 1000 MG tablet Take 1,000 mg by mouth 2 (two) times daily with a meal.    . Probiotic Product (PROBIOTIC PO) Take by mouth daily.    . ASPIRIN LOW DOSE 81 MG EC tablet Take 81 mg by mouth daily. (Patient not taking: Reported on 09/24/2020)    . Calcium Carbonate-Vitamin D 600-400 MG-UNIT tablet Take 2 tablets by mouth daily.   (Patient not taking: Reported on 09/24/2020)     No current facility-administered medications for this visit.    Allergies as of 09/24/2020  . (No Known Allergies)    Family History  Problem Relation Age of Onset  . Coronary artery disease Mother        MI age 74  . Lung cancer Father     Social History   Socioeconomic History  . Marital status: Widowed    Spouse name: Not on file  . Number of children: Not on file  . Years of education: Not on file  . Highest education level: Not on file  Occupational History    Employer: [employer]    Comment: Full time at [employer] [place] Use  .  Smoking status: Current Every Day Smoker    Packs/day: 1.00    Years: 16.00    Pack years: 16.00    Types: Cigarettes    Start date: 01/31/1972    Last attempt to quit: 10/17/2019    Years since quitting: 0.9  . Smokeless tobacco: Never Used  Substance and Sexual Activity  . Alcohol use: No  . Drug use: No  . Sexual activity: Not on file  Other Topics Concern  . Not on file  Social History Narrative  . Not on file   Social Determinants of Health   Financial Resource Strain: Not on file  Food Insecurity: Not on file  Transportation Needs: Not on file  Physical Activity: Not on file  Stress: Not on file  Social Connections: Not on file  Intimate Partner Violence: Not on file    Subjective: Review of Systems  Constitutional: Negative for chills and fever.  HENT: Negative for congestion and hearing loss.   Eyes: Negative for blurred vision and double vision.   Respiratory: Negative for cough and shortness of breath.   Cardiovascular: Negative for chest pain and palpitations.  Gastrointestinal: Positive for abdominal pain and nausea. Negative for blood in stool, constipation, diarrhea, heartburn, melena and vomiting.  Genitourinary: Negative for dysuria and urgency.  Musculoskeletal: Negative for joint pain and myalgias.  Skin: Negative for itching and rash.  Neurological: Negative for dizziness and headaches.  Psychiatric/Behavioral: Negative for depression. The patient is not nervous/anxious.        Objective: BP 126/85   Pulse (!) 101   Temp (!) 97.1 F (36.2 C) (Temporal)   Ht 5\' 4"  (1.626 m)   Wt 93 lb 12.8 oz (42.5 kg)   BMI 16.10 kg/m  Physical Exam Constitutional:      Appearance: Normal appearance.     Comments: Patient appears underweight  HENT:     Head: Normocephalic and atraumatic.  Eyes:     Extraocular Movements: Extraocular movements intact.     Conjunctiva/sclera: Conjunctivae normal.  Cardiovascular:     Rate and Rhythm: Normal rate and regular rhythm.  Pulmonary:     Effort: Pulmonary effort is normal.     Breath sounds: Normal breath sounds.  Abdominal:     General: Bowel sounds are normal.     Palpations: Abdomen is soft.  Musculoskeletal:        General: No swelling. Normal range of motion.     Cervical back: Normal range of motion and neck supple.  Skin:    General: Skin is warm and dry.     Coloration: Skin is not jaundiced.  Neurological:     General: No focal deficit present.     Mental Status: She is alert and oriented to person, place, and time.  Psychiatric:        Mood and Affect: Mood normal.        Behavior: Behavior normal.      Assessment: *Epigastric pain *Nausea and vomiting *Weight loss *Colon cancer screen  Plan: Etiology of patient's epigastric pain and nausea unclear. Will schedule for EGD to evaluate for peptic ulcer disease, esophagitis, gastritis, H. Pylori,  duodenitis, or other. Will also evaluate for esophageal stricture, Schatzki's ring, esophageal web or other.   At the same time we will perform colonoscopy for colon cancer screening purposes.  The risks including infection, bleed, or perforation as well as benefits, limitations, alternatives and imponderables have been reviewed with the patient. Potential for esophageal dilation, biopsy, etc. have also  been reviewed.  Questions have been answered. All parties agreeable.  For her nausea I will send in promethazine to take as needed as Zofran is not working.  Thank you Dr. Quillian Quince for the kind referral.  Further recommendations to follow   09/24/2020 9:38 AM   Disclaimer: This note was dictated with voice recognition software. Similar sounding words can inadvertently be transcribed and may not be corrected upon review.

## 2020-10-14 NOTE — Patient Instructions (Signed)
Ashlee Austin  10/14/2020     @PREFPERIOPPHARMACY @   Your procedure is scheduled on  10/20/2020   Report to [facility] at  52  A.M.   Call this number if you have problems the morning of surgery:  [phone]     Remember:  Follow the diet and prep instructions given to  You by the office.                    Take these medicines the morning of surgery with A SIP OF WATER  Xanax(if needed),celexa, protonix, phenergan (if needed).    Please brush your teeth.  Do not wear jewelry, make-up or nail polish.  Do not wear lotions, powders, or perfumes, or deodorant.  Do not shave 48 hours prior to surgery.  Men may shave face and neck.  Do not bring valuables to the hospital.  [facility] is not responsible for any belongings or valuables.  Contacts, dentures or bridgework may not be worn into surgery.  Leave your suitcase in the car.  After surgery it may be brought to your room.  For patients admitted to the hospital, discharge time will be determined by your treatment team.  Patients discharged the day of surgery will not be allowed to drive home and must have someone with them for 23 hours.   Special instructions:  DO NOT smoke tobacco or vape the morning of yhour procedure.  Please read over the following fact sheets that you were given. Anesthesia Post-op Instructions and Care and Recovery After Surgery       Upper Endoscopy, Adult, Care After This sheet gives you information about how to care for yourself after your procedure. Your health care provider may also give you more specific instructions. If you have problems or questions, contact your health care provider. What can I expect after the procedure? After the procedure, it is common to have:  A sore throat.  Mild stomach pain or discomfort.  Bloating.  Nausea. Follow these instructions at home:  Follow instructions from your health care provider about what to eat or drink after your  procedure.  Return to your normal activities as told by your health care provider. Ask your health care provider what activities are safe for you.  Take over-the-counter and prescription medicines only as told by your health care provider.  If you were given a sedative during the procedure, it can affect you for several hours. Do not drive or operate machinery until your health care provider says that it is safe.  Keep all follow-up visits as told by your health care provider. This is important.   Contact a health care provider if you have:  A sore throat that lasts longer than one day.  Trouble swallowing. Get help right away if:  You vomit blood or your vomit looks like coffee grounds.  You have: ? A fever. ? Bloody, black, or tarry stools. ? A severe sore throat or you cannot swallow. ? Difficulty breathing. ? Severe pain in your chest or abdomen. Summary  After the procedure, it is common to have a sore throat, mild stomach discomfort, bloating, and nausea.  If you were given a sedative during the procedure, it can affect you for several hours. Do not drive or operate machinery until your health care provider says that it is safe.  Follow instructions from your health care provider about what to eat or drink after your procedure.  Return to your normal activities as told by your health care provider. This information is not intended to replace advice given to you by your health care provider. Make sure you discuss any questions you have with your health care provider. Document Revised: 07/02/2019 Document Reviewed: 12/04/2017 Elsevier Patient Education  2021 [place].  Colonoscopy, Adult, Care After This sheet gives you information about how to care for yourself after your procedure. Your health care provider may also give you more specific instructions. If you have problems or questions, contact your health care provider. What can I expect after the procedure? After  the procedure, it is common to have:  A small amount of blood in your stool for 24 hours after the procedure.  Some gas.  Mild cramping or bloating of your abdomen. Follow these instructions at home: Eating and drinking  Drink enough fluid to keep your urine pale yellow.  Follow instructions from your health care provider about eating or drinking restrictions.  Resume your normal diet as instructed by your health care provider. Avoid heavy or fried foods that are hard to digest.   Activity  Rest as told by your health care provider.  Avoid sitting for a long time without moving. Get up to take short walks every 1-2 hours. This is important to improve blood flow and breathing. Ask for help if you feel weak or unsteady.  Return to your normal activities as told by your health care provider. Ask your health care provider what activities are safe for you. Managing cramping and bloating  Try walking around when you have cramps or feel bloated.  Apply heat to your abdomen as told by your health care provider. Use the heat source that your health care provider recommends, such as a moist heat pack or a heating pad. ? Place a towel between your skin and the heat source. ? Leave the heat on for 20-30 minutes. ? Remove the heat if your skin turns bright red. This is especially important if you are unable to feel pain, heat, or cold. You may have a greater risk of getting burned.   General instructions  If you were given a sedative during the procedure, it can affect you for several hours. Do not drive or operate machinery until your health care provider says that it is safe.  For the first 24 hours after the procedure: ? Do not sign important documents. ? Do not drink alcohol. ? Do your regular daily activities at a slower pace than normal. ? Eat soft foods that are easy to digest.  Take over-the-counter and prescription medicines only as told by your health care provider.  Keep all  follow-up visits as told by your health care provider. This is important. Contact a health care provider if:  You have blood in your stool 2-3 days after the procedure. Get help right away if you have:  More than a small spotting of blood in your stool.  Large blood clots in your stool.  Swelling of your abdomen.  Nausea or vomiting.  A fever.  Increasing pain in your abdomen that is not relieved with medicine. Summary  After the procedure, it is common to have a small amount of blood in your stool. You may also have mild cramping and bloating of your abdomen.  If you were given a sedative during the procedure, it can affect you for several hours. Do not drive or operate machinery until your health care provider says that it is safe.  Get help right away if you have a lot of blood in your stool, nausea or vomiting, a fever, or increased pain in your abdomen. This information is not intended to replace advice given to you by your health care provider. Make sure you discuss any questions you have with your health care provider. Document Revised: 06/28/2019 Document Reviewed: 01/28/2019 Elsevier Patient Education  2021 [place] After This sheet gives you information about how to care for yourself after your procedure. Your health care provider may also give you more specific instructions. If you have problems or questions, contact your health care provider. What can I expect after the procedure? After the procedure, it is common to have:  Tiredness.  Forgetfulness about what happened after the procedure.  Impaired judgment for important decisions.  Nausea or vomiting.  Some difficulty with balance. Follow these instructions at home: For the time period you were told by your health care provider:  Rest as needed.  Do not participate in activities where you could fall or become injured.  Do not drive or use machinery.  Do not drink  alcohol.  Do not take sleeping pills or medicines that cause drowsiness.  Do not make important decisions or sign legal documents.  Do not take care of children on your own.      Eating and drinking  Follow the diet that is recommended by your health care provider.  Drink enough fluid to keep your urine pale yellow.  If you vomit: ? Drink water, juice, or soup when you can drink without vomiting. ? Make sure you have little or no nausea before eating solid foods. General instructions  Have a responsible adult stay with you for the time you are told. It is important to have someone help care for you until you are awake and alert.  Take over-the-counter and prescription medicines only as told by your health care provider.  If you have sleep apnea, surgery and certain medicines can increase your risk for breathing problems. Follow instructions from your health care provider about wearing your sleep device: ? Anytime you are sleeping, including during daytime naps. ? While taking prescription pain medicines, sleeping medicines, or medicines that make you drowsy.  Avoid smoking.  Keep all follow-up visits as told by your health care provider. This is important. Contact a health care provider if:  You keep feeling nauseous or you keep vomiting.  You feel light-headed.  You are still sleepy or having trouble with balance after 24 hours.  You develop a rash.  You have a fever.  You have redness or swelling around the IV site. Get help right away if:  You have trouble breathing.  You have new-onset confusion at home. Summary  For several hours after your procedure, you may feel tired. You may also be forgetful and have poor judgment.  Have a responsible adult stay with you for the time you are told. It is important to have someone help care for you until you are awake and alert.  Rest as told. Do not drive or operate machinery. Do not drink alcohol or take sleeping  pills.  Get help right away if you have trouble breathing, or if you suddenly become confused. This information is not intended to replace advice given to you by your health care provider. Make sure you discuss any questions you have with your health care provider. Document Revised: 03/19/2020 Document Reviewed: 06/06/2019 Elsevier Patient Education  2021 [employer].

## 2020-10-16 ENCOUNTER — Encounter (HOSPITAL_COMMUNITY): Payer: Self-pay

## 2020-10-16 ENCOUNTER — Other Ambulatory Visit: Payer: Self-pay

## 2020-10-16 ENCOUNTER — Encounter (HOSPITAL_COMMUNITY)
Admission: RE | Admit: 2020-10-16 | Discharge: 2020-10-16 | Disposition: A | Discharge status: Home / Self Care | Payer: Medicare HMO | Source: Ambulatory Visit | Attending: Internal Medicine | Admitting: Internal Medicine

## 2020-10-16 ENCOUNTER — Other Ambulatory Visit (HOSPITAL_COMMUNITY)
Admission: RE | Admit: 2020-10-16 | Discharge: 2020-10-16 | Disposition: A | Discharge status: Home / Self Care | Payer: Medicare HMO | Source: Ambulatory Visit | Attending: Internal Medicine | Admitting: Internal Medicine

## 2020-10-16 DIAGNOSIS — Z20822 Contact with and (suspected) exposure to covid-19: Secondary | ICD-10-CM | POA: Diagnosis not present

## 2020-10-16 DIAGNOSIS — Z01818 Encounter for other preprocedural examination: Secondary | ICD-10-CM | POA: Insufficient documentation

## 2020-10-16 LAB — BASIC METABOLIC PANEL
Anion gap: 11 (ref 5–15)
BUN: 24 mg/dL — ABNORMAL HIGH (ref 8–23)
CO2: 22 mmol/L (ref 22–32)
Calcium: 10.1 mg/dL (ref 8.9–10.3)
Chloride: 102 mmol/L (ref 98–111)
Creatinine, Ser: 0.83 mg/dL (ref 0.44–1.00)
GFR, Estimated: >60 mL/min (ref >60)
Glucose, Bld: 159 mg/dL — ABNORMAL HIGH (ref 70–99)
Potassium: 4.6 mmol/L (ref 3.5–5.1)
Sodium: 135 mmol/L (ref 135–145)

## 2020-10-16 LAB — SARS CORONAVIRUS 2 (TAT 6-24 HRS): SARS Coronavirus 2: NEGATIVE

## 2020-10-20 ENCOUNTER — Encounter (HOSPITAL_COMMUNITY): Payer: Self-pay

## 2020-10-20 ENCOUNTER — Ambulatory Visit (HOSPITAL_COMMUNITY)
Admission: RE | Admit: 2020-10-20 | Discharge: 2020-10-20 | Disposition: A | Discharge status: Home / Self Care | Payer: Medicare HMO | Attending: Internal Medicine | Admitting: Internal Medicine

## 2020-10-20 ENCOUNTER — Ambulatory Visit (HOSPITAL_COMMUNITY): Payer: Medicare HMO | Admitting: Anesthesiology

## 2020-10-20 ENCOUNTER — Encounter (HOSPITAL_COMMUNITY)
Admission: RE | Disposition: A | Discharge status: Home / Self Care | Payer: Self-pay | Source: Home / Self Care | Attending: Internal Medicine

## 2020-10-20 DIAGNOSIS — K648 Other hemorrhoids: Secondary | ICD-10-CM | POA: Diagnosis not present

## 2020-10-20 DIAGNOSIS — K319 Disease of stomach and duodenum, unspecified: Secondary | ICD-10-CM | POA: Diagnosis not present

## 2020-10-20 DIAGNOSIS — Z8249 Family history of ischemic heart disease and other diseases of the circulatory system: Secondary | ICD-10-CM | POA: Diagnosis not present

## 2020-10-20 DIAGNOSIS — F17210 Nicotine dependence, cigarettes, uncomplicated: Secondary | ICD-10-CM | POA: Diagnosis not present

## 2020-10-20 DIAGNOSIS — K635 Polyp of colon: Secondary | ICD-10-CM | POA: Diagnosis not present

## 2020-10-20 DIAGNOSIS — E785 Hyperlipidemia, unspecified: Secondary | ICD-10-CM | POA: Diagnosis not present

## 2020-10-20 DIAGNOSIS — R1013 Epigastric pain: Secondary | ICD-10-CM | POA: Insufficient documentation

## 2020-10-20 DIAGNOSIS — K2970 Gastritis, unspecified, without bleeding: Secondary | ICD-10-CM

## 2020-10-20 DIAGNOSIS — Z79899 Other long term (current) drug therapy: Secondary | ICD-10-CM | POA: Insufficient documentation

## 2020-10-20 DIAGNOSIS — J449 Chronic obstructive pulmonary disease, unspecified: Secondary | ICD-10-CM | POA: Diagnosis not present

## 2020-10-20 DIAGNOSIS — D123 Benign neoplasm of transverse colon: Secondary | ICD-10-CM | POA: Diagnosis not present

## 2020-10-20 DIAGNOSIS — I10 Essential (primary) hypertension: Secondary | ICD-10-CM | POA: Insufficient documentation

## 2020-10-20 DIAGNOSIS — B3781 Candidal esophagitis: Secondary | ICD-10-CM

## 2020-10-20 DIAGNOSIS — R112 Nausea with vomiting, unspecified: Secondary | ICD-10-CM | POA: Diagnosis not present

## 2020-10-20 DIAGNOSIS — Z7984 Long term (current) use of oral hypoglycemic drugs: Secondary | ICD-10-CM | POA: Diagnosis not present

## 2020-10-20 DIAGNOSIS — I2510 Atherosclerotic heart disease of native coronary artery without angina pectoris: Secondary | ICD-10-CM | POA: Diagnosis not present

## 2020-10-20 DIAGNOSIS — E119 Type 2 diabetes mellitus without complications: Secondary | ICD-10-CM | POA: Diagnosis not present

## 2020-10-20 DIAGNOSIS — K5730 Diverticulosis of large intestine without perforation or abscess without bleeding: Secondary | ICD-10-CM | POA: Insufficient documentation

## 2020-10-20 DIAGNOSIS — E039 Hypothyroidism, unspecified: Secondary | ICD-10-CM | POA: Insufficient documentation

## 2020-10-20 DIAGNOSIS — Z90710 Acquired absence of both cervix and uterus: Secondary | ICD-10-CM | POA: Diagnosis not present

## 2020-10-20 DIAGNOSIS — Z1211 Encounter for screening for malignant neoplasm of colon: Secondary | ICD-10-CM | POA: Insufficient documentation

## 2020-10-20 HISTORY — PX: BIOPSY: SHX5522

## 2020-10-20 HISTORY — PX: ESOPHAGOGASTRODUODENOSCOPY (EGD) WITH PROPOFOL: SHX5813

## 2020-10-20 HISTORY — PX: COLONOSCOPY WITH PROPOFOL: SHX5780

## 2020-10-20 LAB — KOH PREP

## 2020-10-20 LAB — GLUCOSE, CAPILLARY: Glucose-Capillary: 135 mg/dL — ABNORMAL HIGH (ref 70–99)

## 2020-10-20 SURGERY — COLONOSCOPY WITH PROPOFOL
Anesthesia: General

## 2020-10-20 MED ORDER — LACTATED RINGERS IV SOLN: INTRAVENOUS | Status: DC

## 2020-10-20 MED ORDER — PHENYLEPHRINE 40 MCG/ML (10ML) SYRINGE FOR IV PUSH (FOR BLOOD PRESSURE SUPPORT)
PREFILLED_SYRINGE | INTRAVENOUS | Status: AC
  Filled 2020-10-20: qty 10

## 2020-10-20 MED ORDER — PROPOFOL 10 MG/ML IV BOLUS
INTRAVENOUS | Status: DC | PRN
  Administered 2020-10-20: 40 mg via INTRAVENOUS
  Administered 2020-10-20: 80 mg via INTRAVENOUS
  Administered 2020-10-20: 20 mg via INTRAVENOUS

## 2020-10-20 MED ORDER — PHENYLEPHRINE 40 MCG/ML (10ML) SYRINGE FOR IV PUSH (FOR BLOOD PRESSURE SUPPORT)
PREFILLED_SYRINGE | INTRAVENOUS | Status: DC | PRN
  Administered 2020-10-20: 80 ug via INTRAVENOUS
  Administered 2020-10-20: 40 ug via INTRAVENOUS

## 2020-10-20 MED ORDER — PANTOPRAZOLE SODIUM 40 MG PO TBEC: 40.0000 mg | DELAYED_RELEASE_TABLET | Freq: Two times a day (BID) | ORAL | 5 refills | Status: DC

## 2020-10-20 MED ORDER — LIDOCAINE HCL (CARDIAC) PF 100 MG/5ML IV SOSY
PREFILLED_SYRINGE | INTRAVENOUS | Status: DC | PRN
  Administered 2020-10-20: 50 mg via INTRAVENOUS

## 2020-10-20 MED ORDER — PROPOFOL 500 MG/50ML IV EMUL
INTRAVENOUS | Status: DC | PRN
  Administered 2020-10-20: 150 ug/kg/min via INTRAVENOUS

## 2020-10-20 NOTE — Discharge Instructions (Signed)
Upper Endoscopy, Adult, Care After This sheet gives you information about how to care for yourself after your procedure. Your health care provider may also give you more specific instructions. If you have problems or questions, contact your health care provider. What can I expect after the procedure? After the procedure, it is common to have:  A sore throat.  Mild stomach pain or discomfort.  Bloating.  Nausea. Follow these instructions at home:  Follow instructions from your health care provider about what to eat or drink after your procedure.  Return to your normal activities as told by your health care provider. Ask your health care provider what activities are safe for you.  Take over-the-counter and prescription medicines only as told by your health care provider.  If you were given a sedative during the procedure, it can affect you for several hours. Do not drive or operate machinery until your health care provider says that it is safe.  Keep all follow-up visits as told by your health care provider. This is important.   Contact a health care provider if you have:  A sore throat that lasts longer than one day.  Trouble swallowing. Get help right away if:  You vomit blood or your vomit looks like coffee grounds.  You have: ? A fever. ? Bloody, black, or tarry stools. ? A severe sore throat or you cannot swallow. ? Difficulty breathing. ? Severe pain in your chest or abdomen. Summary  After the procedure, it is common to have a sore throat, mild stomach discomfort, bloating, and nausea.  If you were given a sedative during the procedure, it can affect you for several hours. Do not drive or operate machinery until your health care provider says that it is safe.  Follow instructions from your health care provider about what to eat or drink after your procedure.  Return to your normal activities as told by your health care provider. This information is not intended to  replace advice given to you by your health care provider. Make sure you discuss any questions you have with your health care provider. Document Revised: 07/02/2019 Document Reviewed: 12/04/2017 Elsevier Patient Education  2021 [employer]. Colonoscopy, Adult, Care After This sheet gives you information about how to care for yourself after your procedure. Your health care provider may also give you more specific instructions. If you have problems or questions, contact your health care provider. What can I expect after the procedure? After the procedure, it is common to have:  A small amount of blood in your stool for 24 hours after the procedure.  Some gas.  Mild cramping or bloating of your abdomen. Follow these instructions at home: Eating and drinking  Drink enough fluid to keep your urine pale yellow.  Follow instructions from your health care provider about eating or drinking restrictions.  Resume your normal diet as instructed by your health care provider. Avoid heavy or fried foods that are hard to digest.   Activity  Rest as told by your health care provider.  Avoid sitting for a long time without moving. Get up to take short walks every 1-2 hours. This is important to improve blood flow and breathing. Ask for help if you feel weak or unsteady.  Return to your normal activities as told by your health care provider. Ask your health care provider what activities are safe for you. Managing cramping and bloating  Try walking around when you have cramps or feel bloated.  Apply heat to your  abdomen as told by your health care provider. Use the heat source that your health care provider recommends, such as a moist heat pack or a heating pad. ? Place a towel between your skin and the heat source. ? Leave the heat on for 20-30 minutes. ? Remove the heat if your skin turns bright red. This is especially important if you are unable to feel pain, heat, or cold. You may have a greater  risk of getting burned.   General instructions  If you were given a sedative during the procedure, it can affect you for several hours. Do not drive or operate machinery until your health care provider says that it is safe.  For the first 24 hours after the procedure: ? Do not sign important documents. ? Do not drink alcohol. ? Do your regular daily activities at a slower pace than normal. ? Eat soft foods that are easy to digest.  Take over-the-counter and prescription medicines only as told by your health care provider.  Keep all follow-up visits as told by your health care provider. This is important. Contact a health care provider if:  You have blood in your stool 2-3 days after the procedure. Get help right away if you have:  More than a small spotting of blood in your stool.  Large blood clots in your stool.  Swelling of your abdomen.  Nausea or vomiting.  A fever.  Increasing pain in your abdomen that is not relieved with medicine. Summary  After the procedure, it is common to have a small amount of blood in your stool. You may also have mild cramping and bloating of your abdomen.  If you were given a sedative during the procedure, it can affect you for several hours. Do not drive or operate machinery until your health care provider says that it is safe.  Get help right away if you have a lot of blood in your stool, nausea or vomiting, a fever, or increased pain in your abdomen. This information is not intended to replace advice given to you by your health care provider. Make sure you discuss any questions you have with your health care provider. Document Revised: 06/28/2019 Document Reviewed: 01/28/2019 Elsevier Patient Education  2021 [place] POST-ANESTHESIA  IMMEDIATELY FOLLOWING SURGERY:  Do not drive or operate machinery for the first twenty four hours after surgery.  Do not make any important decisions for twenty four hours after  surgery or while taking narcotic pain medications or sedatives.  If you develop intractable nausea and vomiting or a severe headache please notify your doctor immediately.  FOLLOW-UP:  Please make an appointment with your surgeon as instructed. You do not need to follow up with anesthesia unless specifically instructed to do so.  WOUND CARE INSTRUCTIONS (if applicable):  Keep a dry clean dressing on the anesthesia/puncture wound site if there is drainage.  Once the wound has quit draining you may leave it open to air.  Generally you should leave the bandage intact for twenty four hours unless there is drainage.  If the epidural site drains for more than 36-48 hours please call the anesthesia department.  QUESTIONS?:  Please feel free to call your physician or the hospital operator if you have any questions, and they will be happy to assist you.     Your EKG showed a mild amount of sinus in your stomach and esophagus.  I took samples of both.  Await pathology results, my office will contact you.  Going to increase your pantoprazole to 40 mg twice daily.  Avoid all NSAIDs  Your colonoscopy revealed 3 polyp(s) which I removed successfully. Await pathology results, my office will contact you. I recommend repeating colonoscopy in 5 years for surveillance purposes.

## 2020-10-20 NOTE — Interval H&P Note (Signed)
History and Physical Interval Note:  10/20/2020 12:13 PM  Ashlee Austin  has presented today for surgery, with the diagnosis of screneing, epigastric pain, nausea.  The various methods of treatment have been discussed with the patient and family. After consideration of risks, benefits and other options for treatment, the patient has consented to  Procedure(s) with comments: COLONOSCOPY WITH PROPOFOL (N/A) - pm (early as possible), diabetic ESOPHAGOGASTRODUODENOSCOPY (EGD) WITH PROPOFOL (N/A) as a surgical intervention.  The patient's history has been reviewed, patient examined, no change in status, stable for surgery.  I have reviewed the patient's chart and labs.  Questions were answered to the patient's satisfaction.     [name]

## 2020-10-20 NOTE — Op Note (Signed)
[facility] Patient Name: Ashlee Austin Procedure Date: 10/20/2020 12:29 PM MRN: [id number] Date of Birth: [birth year] Attending MD: Elon Alas. Abbey Chatters DO CSN: [id number] Age: 65 Admit Type: Outpatient Procedure:                Upper GI endoscopy Indications:              Epigastric abdominal pain, Nausea with vomiting Providers:                Elon Alas. Abbey Chatters, DO, Lambert Mody, Raphael Gibney, Technician Referring MD:              Medicines:                See the Anesthesia note for documentation of the                            administered medications Complications:            No immediate complications. Estimated Blood Loss:     Estimated blood loss was minimal. Procedure:                Pre-Anesthesia Assessment:                           - The anesthesia plan was to use monitored                            anesthesia care (MAC).                           After obtaining informed consent, the endoscope was                            passed under direct vision. Throughout the                            procedure, the patient's blood pressure, pulse, and                            oxygen saturations were monitored continuously. The                            [id number]) was introduced through the mouth,                            and advanced to the second part of duodenum. The                            upper GI endoscopy was accomplished without                            difficulty. The patient tolerated the procedure                            well. Scope In: 12:29:29 PM  Scope Out: 12:34:44 PM Total Procedure Duration: 0 hours 5 minutes 15 seconds  Findings:      Non-severe esophagitis with no bleeding was found in the middle third of       the esophagus. Cells for cytology were obtained by brushing.      Localized moderate inflammation characterized by congestion (edema),       erosions and erythema was found in the gastric  antrum. Biopsies were       taken with a cold forceps for Helicobacter pylori testing. Questionable       portal HTN gastropathy. Impression:               - Non-severe candidiasis esophagitis with no                            bleeding. Cells for cytology obtained.                           - Gastritis. Biopsied. Moderate Sedation:      Per Anesthesia Care Recommendation:           - Patient has a contact number available for                            emergencies. The signs and symptoms of potential                            delayed complications were discussed with the                            patient. Return to normal activities tomorrow.                            Written discharge instructions were provided to the                            patient.                           - Resume previous diet.                           - Continue present medications.                           - Await pathology results.                           - Use a proton pump inhibitor PO BID.                           - Treat for Candida if cytology positive.                           - Return to GI clinic in 3 months. Procedure Code(s):        --- Professional ---  71219, Esophagogastroduodenoscopy, flexible,                            transoral; with biopsy, single or multiple Diagnosis Code(s):        --- Professional ---                           B37.81, Candidal esophagitis                           K29.70, Gastritis, unspecified, without bleeding                           R10.13, Epigastric pain                           R11.2, Nausea with vomiting, unspecified CPT copyright 2019 American Medical Association. All rights reserved. The codes documented in this report are preliminary and upon coder review may  be revised to meet current compliance requirements. Elon Alas. Abbey Chatters, DO [place] Abbey Chatters, DO 10/20/2020 12:40:01 PM This report has been signed  electronically. Number of Addenda: 0

## 2020-10-20 NOTE — Transfer of Care (Signed)
Immediate Anesthesia Transfer of Care Note  Patient: Ashlee Austin  Procedure(s) Performed: COLONOSCOPY WITH PROPOFOL (N/A ) ESOPHAGOGASTRODUODENOSCOPY (EGD) WITH PROPOFOL (N/A ) BIOPSY  Patient Location: Short Stay  Anesthesia Type:General  Level of Consciousness: awake, alert  and oriented  Airway & Oxygen Therapy: Patient Spontanous Breathing  Post-op Assessment: Report given to RN, Post -op Vital signs reviewed and stable and Patient moving all extremities X 4  Post vital signs: Reviewed and stable  Last Vitals:  Vitals Value Taken Time  BP    Temp    Pulse    Resp    SpO2      Last Pain:  Vitals:   10/20/20 1139  TempSrc: Oral  PainSc: 0-No pain      Patients Stated Pain Goal: 7 (16/38/45 3646)  Complications: No complications documented.

## 2020-10-20 NOTE — Anesthesia Postprocedure Evaluation (Signed)
Anesthesia Post Note  Patient: SHARONANN MALBROUGH  Procedure(s) Performed: COLONOSCOPY WITH PROPOFOL (N/A ) ESOPHAGOGASTRODUODENOSCOPY (EGD) WITH PROPOFOL (N/A ) BIOPSY  Patient location during evaluation: Phase II Anesthesia Type: General Level of consciousness: awake Pain management: pain level controlled Vital Signs Assessment: post-procedure vital signs reviewed and stable Respiratory status: spontaneous breathing and respiratory function stable Cardiovascular status: blood pressure returned to baseline and stable Postop Assessment: no headache and no apparent nausea or vomiting Anesthetic complications: no Comments: Late entry   No complications documented.   Last Vitals:  Vitals:   10/20/20 1139 10/20/20 1309  BP: 138/79 139/72  Pulse: 99 86  Resp: 16 20  Temp: 36.7 C 36.6 C  SpO2: 97% 100%    Last Pain:  Vitals:   10/20/20 1309  TempSrc: Oral  PainSc: 0-No pain                 Louann Sjogren

## 2020-10-20 NOTE — Anesthesia Preprocedure Evaluation (Signed)
Anesthesia Evaluation  Patient identified by MRN, date of birth, ID band Patient awake    Reviewed: Allergy & Precautions, H&P , NPO status , Patient's Chart, lab work & pertinent test results, reviewed documented beta blocker date and time   Airway Mallampati: II  TM Distance: >3 FB Neck ROM: full    Dental no notable dental hx.    Pulmonary COPD, Current Smoker and Patient abstained from smoking.,    Pulmonary exam normal breath sounds clear to auscultation       Cardiovascular Exercise Tolerance: Good hypertension, + CAD   Rhythm:regular Rate:Normal     Neuro/Psych PSYCHIATRIC DISORDERS Depression  Neuromuscular disease CVA, Residual Symptoms    GI/Hepatic negative GI ROS, Neg liver ROS,   Endo/Other  diabetesHypothyroidism   Renal/GU negative Renal ROS  negative genitourinary   Musculoskeletal   Abdominal   Peds  Hematology negative hematology ROS (+)   Anesthesia Other Findings   Reproductive/Obstetrics negative OB ROS                             Anesthesia Physical Anesthesia Plan  ASA: III  Anesthesia Plan: General   Post-op Pain Management:    Induction:   PONV Risk Score and Plan: Propofol infusion  Airway Management Planned:   Additional Equipment:   Intra-op Plan:   Post-operative Plan:   Informed Consent: I have reviewed the patients History and Physical, chart, labs and discussed the procedure including the risks, benefits and alternatives for the proposed anesthesia with the patient or authorized representative who has indicated his/her understanding and acceptance.     Dental Advisory Given  Plan Discussed with: CRNA  Anesthesia Plan Comments:         Anesthesia Quick Evaluation

## 2020-10-20 NOTE — Op Note (Signed)
[facility] Patient Name: Ashlee Austin Procedure Date: 10/20/2020 12:40 PM MRN: [id number] Date of Birth: 03-Dec-1955 Attending MD: Elon Alas. Abbey Chatters DO CSN: [id number] Age: 65 Admit Type: Outpatient Procedure:                Colonoscopy Indications:              Screening for colorectal malignant neoplasm Providers:                Elon Alas. Asherah Lavoy, DO, [place] B. Sharon Seller, RN,                            Raphael Gibney, Technician Referring MD:              Medicines:                See the Anesthesia note for documentation of the                            administered medications Complications:            No immediate complications. Estimated Blood Loss:     Estimated blood loss was minimal. Procedure:                Pre-Anesthesia Assessment:                           - The anesthesia plan was to use monitored                            anesthesia care (MAC).                           After obtaining informed consent, the colonoscope                            was passed under direct vision. Throughout the                            procedure, the patient's blood pressure, pulse, and                            oxygen saturations were monitored continuously. The                            [id number] was introduced through the anus                            and advanced to the the cecum, identified by                            appendiceal orifice and ileocecal valve. The                            colonoscopy was performed without difficulty. The                            patient tolerated the procedure well. The  quality                            of the bowel preparation was evaluated using the                            BBPS [facility] Bowel Preparation Scale) with scores                            of: Right Colon = 3, Transverse Colon = 3 and Left                            Colon = 3 (entire mucosa seen well with no residual                            staining, small  fragments of stool or opaque                            liquid). The total BBPS score equals 9. Scope In: 12:42:22 PM Scope Out: 1:02:31 PM Scope Withdrawal Time: 0 hours 5 minutes 58 seconds  Total Procedure Duration: 0 hours 20 minutes 9 seconds  Findings:      The perianal and digital rectal examinations were normal.      Non-bleeding internal hemorrhoids were found during endoscopy.      A few small-mouthed diverticula were found in the sigmoid colon.      Three sessile polyps were found in the transverse colon. The polyps were       4 to 7 mm in size. These polyps were removed with a cold snare.       Resection and retrieval were complete. Impression:               - Non-bleeding internal hemorrhoids.                           - Diverticulosis in the sigmoid colon.                           - Three 4 to 7 mm polyps in the transverse colon,                            removed with a cold snare. Resected and retrieved. Moderate Sedation:      Per Anesthesia Care Recommendation:           - Patient has a contact number available for                            emergencies. The signs and symptoms of potential                            delayed complications were discussed with the                            patient. Return to normal activities tomorrow.  Written discharge instructions were provided to the                            patient.                           - Resume previous diet.                           - Continue present medications.                           - Await pathology results.                           - Repeat colonoscopy in 5 years for surveillance.                           - Return to GI clinic in 3 months. Procedure Code(s):        --- Professional ---                           [phone], Colonoscopy, flexible; with removal of                            tumor(s), polyp(s), or other lesion(s) by snare                             technique Diagnosis Code(s):        --- Professional ---                           Z12.11, Encounter for screening for malignant                            neoplasm of colon                           K64.8, Other hemorrhoids                           K63.5, Polyp of colon                           K57.30, Diverticulosis of large intestine without                            perforation or abscess without bleeding CPT copyright 2019 American Medical Association. All rights reserved. The codes documented in this report are preliminary and upon coder review may  be revised to meet current compliance requirements. Elon Alas. Abbey Chatters, DO [place] Abbey Chatters, DO 10/20/2020 1:22:22 PM This report has been signed electronically. Number of Addenda: 0

## 2020-10-22 LAB — SURGICAL PATHOLOGY

## 2020-10-26 ENCOUNTER — Encounter (HOSPITAL_COMMUNITY): Payer: Self-pay | Admitting: Internal Medicine

## 2020-10-28 ENCOUNTER — Telehealth: Payer: Self-pay | Admitting: Internal Medicine

## 2020-10-28 NOTE — Telephone Encounter (Signed)
Returned the pt's call advised that her results have not been released to me once they are I will notify her.

## 2020-10-28 NOTE — Telephone Encounter (Signed)
Patient called asking about her procedure results

## 2020-10-29 ENCOUNTER — Telehealth: Payer: Self-pay | Admitting: Internal Medicine

## 2020-10-29 MED ORDER — FLUCONAZOLE 200 MG PO TABS: ORAL_TABLET | ORAL | 0 refills | Status: DC

## 2020-10-29 NOTE — Telephone Encounter (Signed)
I called and discussed pathology results with patient.  She has candidal esophagitis.  I am going to start her on Diflucan 400 mg day 1 and then 200 mg daily for a total of 14 days.  Continue on twice daily PPI.  We will plan on colonoscopy recall in 5 years due to tubular adenoma removed.  Follow-up as previously scheduled.

## 2020-12-23 DIAGNOSIS — E1165 Type 2 diabetes mellitus with hyperglycemia: Secondary | ICD-10-CM | POA: Diagnosis not present

## 2021-01-12 ENCOUNTER — Encounter: Payer: Self-pay | Admitting: Internal Medicine

## 2021-01-27 ENCOUNTER — Ambulatory Visit: Payer: Medicare HMO | Admitting: Internal Medicine

## 2021-01-27 DIAGNOSIS — R5383 Other fatigue: Secondary | ICD-10-CM | POA: Diagnosis not present

## 2021-02-03 DIAGNOSIS — R69 Illness, unspecified: Secondary | ICD-10-CM | POA: Diagnosis not present

## 2021-06-29 DIAGNOSIS — M5432 Sciatica, left side: Secondary | ICD-10-CM | POA: Insufficient documentation

## 2021-07-07 ENCOUNTER — Other Ambulatory Visit: Payer: Self-pay | Admitting: Internal Medicine

## 2021-08-05 DIAGNOSIS — I700 Atherosclerosis of aorta: Secondary | ICD-10-CM | POA: Insufficient documentation

## 2021-08-05 DIAGNOSIS — D529 Folate deficiency anemia, unspecified: Secondary | ICD-10-CM | POA: Insufficient documentation

## 2021-09-13 DIAGNOSIS — S32010A Wedge compression fracture of first lumbar vertebra, initial encounter for closed fracture: Secondary | ICD-10-CM | POA: Insufficient documentation

## 2021-09-15 ENCOUNTER — Other Ambulatory Visit (HOSPITAL_COMMUNITY): Payer: Self-pay | Admitting: Neurosurgery

## 2021-09-15 ENCOUNTER — Ambulatory Visit (HOSPITAL_COMMUNITY)
Admission: RE | Admit: 2021-09-15 | Discharge: 2021-09-15 | Disposition: A | Discharge status: Home / Self Care | Payer: Medicare HMO | Source: Ambulatory Visit | Attending: Neurosurgery | Admitting: Neurosurgery

## 2021-09-15 ENCOUNTER — Other Ambulatory Visit: Payer: Self-pay | Admitting: Neurosurgery

## 2021-09-15 ENCOUNTER — Other Ambulatory Visit: Payer: Self-pay

## 2021-09-15 DIAGNOSIS — M533 Sacrococcygeal disorders, not elsewhere classified: Secondary | ICD-10-CM | POA: Diagnosis not present

## 2021-09-22 DIAGNOSIS — S32002A Unstable burst fracture of unspecified lumbar vertebra, initial encounter for closed fracture: Secondary | ICD-10-CM | POA: Diagnosis not present

## 2021-09-22 DIAGNOSIS — X58XXXA Exposure to other specified factors, initial encounter: Secondary | ICD-10-CM | POA: Diagnosis not present

## 2021-09-22 DIAGNOSIS — Y929 Unspecified place or not applicable: Secondary | ICD-10-CM | POA: Diagnosis not present

## 2021-09-22 DIAGNOSIS — S32010G Wedge compression fracture of first lumbar vertebra, subsequent encounter for fracture with delayed healing: Secondary | ICD-10-CM | POA: Diagnosis not present

## 2021-09-22 DIAGNOSIS — S32010A Wedge compression fracture of first lumbar vertebra, initial encounter for closed fracture: Secondary | ICD-10-CM | POA: Diagnosis not present

## 2021-09-22 DIAGNOSIS — M533 Sacrococcygeal disorders, not elsewhere classified: Secondary | ICD-10-CM | POA: Diagnosis not present

## 2021-10-01 ENCOUNTER — Other Ambulatory Visit: Payer: Self-pay | Admitting: Gastroenterology

## 2021-10-01 DIAGNOSIS — S32010D Wedge compression fracture of first lumbar vertebra, subsequent encounter for fracture with routine healing: Secondary | ICD-10-CM | POA: Diagnosis not present

## 2021-10-01 DIAGNOSIS — S32010A Wedge compression fracture of first lumbar vertebra, initial encounter for closed fracture: Secondary | ICD-10-CM | POA: Diagnosis not present

## 2021-10-01 DIAGNOSIS — M549 Dorsalgia, unspecified: Secondary | ICD-10-CM | POA: Diagnosis not present

## 2021-10-04 NOTE — Telephone Encounter (Signed)
Last office visit 09/24/20 ?

## 2021-10-11 DIAGNOSIS — D529 Folate deficiency anemia, unspecified: Secondary | ICD-10-CM | POA: Diagnosis not present

## 2021-10-11 DIAGNOSIS — D519 Vitamin B12 deficiency anemia, unspecified: Secondary | ICD-10-CM | POA: Diagnosis not present

## 2021-10-11 DIAGNOSIS — D649 Anemia, unspecified: Secondary | ICD-10-CM | POA: Diagnosis not present

## 2021-10-13 ENCOUNTER — Encounter: Payer: Self-pay | Admitting: Internal Medicine

## 2021-10-19 DIAGNOSIS — G5750 Tarsal tunnel syndrome, unspecified lower limb: Secondary | ICD-10-CM | POA: Diagnosis not present

## 2021-10-19 DIAGNOSIS — M7732 Calcaneal spur, left foot: Secondary | ICD-10-CM | POA: Diagnosis not present

## 2021-10-19 DIAGNOSIS — M7731 Calcaneal spur, right foot: Secondary | ICD-10-CM | POA: Diagnosis not present

## 2021-10-19 DIAGNOSIS — M1990 Unspecified osteoarthritis, unspecified site: Secondary | ICD-10-CM | POA: Diagnosis not present

## 2021-10-19 DIAGNOSIS — M25579 Pain in unspecified ankle and joints of unspecified foot: Secondary | ICD-10-CM | POA: Diagnosis not present

## 2021-10-19 DIAGNOSIS — M79672 Pain in left foot: Secondary | ICD-10-CM | POA: Diagnosis not present

## 2021-10-19 DIAGNOSIS — M79671 Pain in right foot: Secondary | ICD-10-CM | POA: Diagnosis not present

## 2021-11-01 NOTE — Progress Notes (Deleted)
GI Office Note    Referring Provider: Rosalee Kaufman, * Primary Care Physician:  Rosalee Kaufman, PA-C Primary GI: Dr. Abbey Chatters  Date:  11/01/2021  ID:  Ashlee Austin, DOB 02-10-1956, MRN [id number]   Chief Complaint   No chief complaint on file.    History of Present Illness  Ashlee Austin is a 66 y.o. female presenting today with a history of CAD, COPD, depression, stroke, HLD, HTN, hypothyroid, diabetes*** presenting today for follow up on anemia   Last Labs Jul 2022: Hgb 11.6, thrombocytosis (platelets 457), Ca 11.4, Alk Phos 122, Na 133  Anemia Patient presents for evaluation of anemia. Anemia was found by {Found:12347}. It has been present for {Numbers; 1-10:13787} {Time Units:19136}. Associated signs & symptoms: {Symptoms:12350}.    Past Medical History:  Diagnosis Date   Carotid artery disease ([place])    COPD (chronic obstructive pulmonary disease) (HCC)    Coronary atherosclerosis of native coronary artery    [facility]; negative Dobutamine echo 6/11   Depression    Essential hypertension    Hyperlipidemia    Hypothyroidism    Stroke ([place]) 2020   Stroke [facility]) 2014   Type 2 diabetes mellitus ([place])     Past Surgical History:  Procedure Laterality Date   BIOPSY  10/20/2020   Procedure: BIOPSY;  Surgeon: [name], DO;  [facility];  Service: Endoscopy;;   CARPAL TUNNEL RELEASE     COLONOSCOPY WITH PROPOFOL N/A 10/20/2020   Procedure: COLONOSCOPY WITH PROPOFOL;  Surgeon: [name], DO;  Location: [facility];  Service: Endoscopy;  Laterality: N/A;  pm (early as possible), diabetic   CYSTECTOMY     Spine   ESOPHAGOGASTRODUODENOSCOPY (EGD) WITH PROPOFOL N/A 10/20/2020   Procedure: ESOPHAGOGASTRODUODENOSCOPY (EGD) WITH PROPOFOL;  Surgeon: [name], DO;  Location[facility];  Service: Endoscopy;  Laterality: N/A;   TOTAL ABDOMINAL HYSTERECTOMY  2004   TYMPANOPLASTY      Current Outpatient Medications   Medication Sig Dispense Refill   ALPRAZolam (XANAX) 1 MG tablet Take 1 mg by mouth 2 (two) times daily.     atorvastatin (LIPITOR) 40 MG tablet Take 20 mg by mouth 3 (three) times a week.  1   cholestyramine (QUESTRAN) 4 GM/DOSE powder Take 4 g by mouth at bedtime as needed (diarrhea).     citalopram (CELEXA) 40 MG tablet Take 40 mg by mouth daily.     clopidogrel (PLAVIX) 75 MG tablet Take 75 mg by mouth daily.     fluconazole (DIFLUCAN) 200 MG tablet Take 2 tablets on day 1 then 1 tablet daily thereafter for a total of 14 days. 15 tablet 0   lisinopril (PRINIVIL,ZESTRIL) 10 MG tablet Take 10 mg by mouth daily.  0   metFORMIN (GLUCOPHAGE) 1000 MG tablet Take 1,000 mg by mouth 2 (two) times daily with a meal.     pantoprazole (PROTONIX) 40 MG tablet TAKE 1 TABLET(40 MG) BY MOUTH TWICE DAILY 60 tablet 5   polyethylene glycol-electrolytes (NULYTELY) 420 g solution As directed 4000 mL 0   Probiotic Product (PROBIOTIC PO) Take 1 tablet by mouth daily.     promethazine (PHENERGAN) 12.5 MG tablet Take 1 tablet (12.5 mg total) by mouth every 8 (eight) hours as needed for nausea or vomiting. 30 tablet 1   pseudoephedrine-acetaminophen (TYLENOL SINUS) 30-500 MG TABS tablet Take 1 tablet by mouth every 8 (eight) hours as needed (sinus).     No current facility-administered medications  for this visit.    Allergies as of 11/02/2021   (No Known Allergies)    Family History  Problem Relation Age of Onset   Coronary artery disease Mother        MI age 10   Lung cancer Father     Social History   Socioeconomic History   Marital status: Widowed    Spouse name: Not on file   Number of children: Not on file   Years of education: Not on file   Highest education level: Not on file  Occupational History    Employer: [employer]    Comment: Full time at [employer]   Smoking status: Every Day    Packs/day: 1.00    Years: 16.00    Pack years: 16.00    Types: Cigarettes     Start date: 01/31/1972    Last attempt to quit: 10/17/2019    Years since quitting: 2.0   Smokeless tobacco: Never  Substance and Sexual Activity   Alcohol use: No   Drug use: No   Sexual activity: Not on file  Other Topics Concern   Not on file  Social History Narrative   Not on file   Social Determinants of Health   Financial Resource Strain: Not on file  Food Insecurity: Not on file  Transportation Needs: Not on file  Physical Activity: Not on file  Stress: Not on file  Social Connections: Not on file     Review of Systems   Gen: Denies fever, chills, anorexia. Denies fatigue, weakness, weight loss.  CV: Denies chest pain, palpitations, syncope, peripheral edema, and claudication. Resp: Denies dyspnea at rest, cough, wheezing, coughing up blood, and pleurisy. GI: Denies vomiting blood, jaundice, and fecal incontinence.   Denies dysphagia or odynophagia. Derm: Denies rash, itching, dry skin Psych: Denies depression, anxiety, memory loss, confusion. No homicidal or suicidal ideation.  Heme: Denies bruising, bleeding, and enlarged lymph nodes.   Physical Exam   There were no vitals taken for this visit.  General:   Alert and oriented. No distress noted. Pleasant and cooperative.  Head:  Normocephalic and atraumatic. Eyes:  Conjuctiva clear without scleral icterus. Mouth:  Oral mucosa pink and moist. Good dentition. No lesions. Lungs:  Clear to auscultation bilaterally. No wheezes, rales, or rhonchi. No distress.  Heart:  S1, S2 present without murmurs appreciated.  Abdomen:  +BS, soft, non-tender and non-distended. No rebound or guarding. No HSM or masses noted. Rectal: *** Msk:  Symmetrical without gross deformities. Normal posture. Extremities:  Without edema. Neurologic:  Alert and  oriented x4 Psych:  Alert and cooperative. Normal mood and affect.   Assessment  Ashlee Austin is a 66 y.o. female with a history of *** presenting today with    PLAN    *****     Venetia Night, MSN, FNP-BC, AGACNP-BC [facility]

## 2021-11-02 ENCOUNTER — Ambulatory Visit: Payer: Medicare HMO | Admitting: Gastroenterology

## 2021-11-02 ENCOUNTER — Encounter: Payer: Self-pay | Admitting: Internal Medicine

## 2021-11-03 DIAGNOSIS — J069 Acute upper respiratory infection, unspecified: Secondary | ICD-10-CM | POA: Diagnosis not present

## 2021-11-11 DIAGNOSIS — Z780 Asymptomatic menopausal state: Secondary | ICD-10-CM | POA: Diagnosis not present

## 2021-11-11 DIAGNOSIS — M8589 Other specified disorders of bone density and structure, multiple sites: Secondary | ICD-10-CM | POA: Diagnosis not present

## 2022-01-08 DIAGNOSIS — I10 Essential (primary) hypertension: Secondary | ICD-10-CM | POA: Diagnosis not present

## 2022-01-08 DIAGNOSIS — R4781 Slurred speech: Secondary | ICD-10-CM | POA: Diagnosis not present

## 2022-01-08 DIAGNOSIS — E119 Type 2 diabetes mellitus without complications: Secondary | ICD-10-CM | POA: Diagnosis not present

## 2022-01-08 DIAGNOSIS — E079 Disorder of thyroid, unspecified: Secondary | ICD-10-CM | POA: Diagnosis not present

## 2022-01-08 DIAGNOSIS — D649 Anemia, unspecified: Secondary | ICD-10-CM | POA: Diagnosis not present

## 2022-01-08 DIAGNOSIS — R531 Weakness: Secondary | ICD-10-CM | POA: Diagnosis not present

## 2022-01-08 DIAGNOSIS — R69 Illness, unspecified: Secondary | ICD-10-CM | POA: Diagnosis not present

## 2022-01-08 DIAGNOSIS — Z7984 Long term (current) use of oral hypoglycemic drugs: Secondary | ICD-10-CM | POA: Diagnosis not present

## 2022-01-08 DIAGNOSIS — R29818 Other symptoms and signs involving the nervous system: Secondary | ICD-10-CM | POA: Diagnosis not present

## 2022-01-08 DIAGNOSIS — E785 Hyperlipidemia, unspecified: Secondary | ICD-10-CM | POA: Diagnosis not present

## 2022-01-08 DIAGNOSIS — E860 Dehydration: Secondary | ICD-10-CM | POA: Diagnosis not present

## 2022-01-10 DIAGNOSIS — M533 Sacrococcygeal disorders, not elsewhere classified: Secondary | ICD-10-CM | POA: Diagnosis not present

## 2022-01-10 DIAGNOSIS — S32010D Wedge compression fracture of first lumbar vertebra, subsequent encounter for fracture with routine healing: Secondary | ICD-10-CM | POA: Diagnosis not present

## 2022-01-10 DIAGNOSIS — M549 Dorsalgia, unspecified: Secondary | ICD-10-CM | POA: Diagnosis not present

## 2022-01-10 DIAGNOSIS — M79671 Pain in right foot: Secondary | ICD-10-CM | POA: Diagnosis not present

## 2022-01-10 DIAGNOSIS — M79672 Pain in left foot: Secondary | ICD-10-CM | POA: Diagnosis not present

## 2022-01-13 DIAGNOSIS — R5383 Other fatigue: Secondary | ICD-10-CM | POA: Diagnosis not present

## 2022-01-13 DIAGNOSIS — R7989 Other specified abnormal findings of blood chemistry: Secondary | ICD-10-CM | POA: Diagnosis not present

## 2022-01-13 DIAGNOSIS — Z681 Body mass index (BMI) 19 or less, adult: Secondary | ICD-10-CM | POA: Diagnosis not present

## 2022-01-13 DIAGNOSIS — E1165 Type 2 diabetes mellitus with hyperglycemia: Secondary | ICD-10-CM | POA: Diagnosis not present

## 2022-01-13 DIAGNOSIS — D649 Anemia, unspecified: Secondary | ICD-10-CM | POA: Diagnosis not present

## 2022-01-13 DIAGNOSIS — I10 Essential (primary) hypertension: Secondary | ICD-10-CM | POA: Diagnosis not present

## 2022-01-13 DIAGNOSIS — I700 Atherosclerosis of aorta: Secondary | ICD-10-CM | POA: Diagnosis not present

## 2022-01-13 DIAGNOSIS — Z8673 Personal history of transient ischemic attack (TIA), and cerebral infarction without residual deficits: Secondary | ICD-10-CM | POA: Diagnosis not present

## 2022-01-14 DIAGNOSIS — R5383 Other fatigue: Secondary | ICD-10-CM | POA: Diagnosis not present

## 2022-01-14 DIAGNOSIS — D649 Anemia, unspecified: Secondary | ICD-10-CM | POA: Diagnosis not present

## 2022-01-19 DIAGNOSIS — R748 Abnormal levels of other serum enzymes: Secondary | ICD-10-CM | POA: Diagnosis not present

## 2022-01-19 DIAGNOSIS — R110 Nausea: Secondary | ICD-10-CM | POA: Diagnosis not present

## 2022-02-03 ENCOUNTER — Encounter: Payer: Self-pay | Admitting: Internal Medicine

## 2022-02-03 DIAGNOSIS — I358 Other nonrheumatic aortic valve disorders: Secondary | ICD-10-CM | POA: Diagnosis not present

## 2022-02-03 DIAGNOSIS — I5189 Other ill-defined heart diseases: Secondary | ICD-10-CM | POA: Diagnosis not present

## 2022-02-03 DIAGNOSIS — R7989 Other specified abnormal findings of blood chemistry: Secondary | ICD-10-CM | POA: Diagnosis not present

## 2022-02-09 DIAGNOSIS — S32010D Wedge compression fracture of first lumbar vertebra, subsequent encounter for fracture with routine healing: Secondary | ICD-10-CM | POA: Diagnosis not present

## 2022-02-09 DIAGNOSIS — M549 Dorsalgia, unspecified: Secondary | ICD-10-CM | POA: Diagnosis not present

## 2022-02-10 ENCOUNTER — Other Ambulatory Visit (HOSPITAL_COMMUNITY): Payer: Self-pay | Admitting: Neurosurgery

## 2022-02-10 ENCOUNTER — Other Ambulatory Visit: Payer: Self-pay | Admitting: Neurosurgery

## 2022-02-10 DIAGNOSIS — M549 Dorsalgia, unspecified: Secondary | ICD-10-CM

## 2022-02-17 DIAGNOSIS — Z122 Encounter for screening for malignant neoplasm of respiratory organs: Secondary | ICD-10-CM | POA: Diagnosis not present

## 2022-02-17 DIAGNOSIS — R69 Illness, unspecified: Secondary | ICD-10-CM | POA: Diagnosis not present

## 2022-02-17 DIAGNOSIS — Z87891 Personal history of nicotine dependence: Secondary | ICD-10-CM | POA: Diagnosis not present

## 2022-02-21 DIAGNOSIS — D529 Folate deficiency anemia, unspecified: Secondary | ICD-10-CM | POA: Diagnosis not present

## 2022-02-21 DIAGNOSIS — D649 Anemia, unspecified: Secondary | ICD-10-CM | POA: Diagnosis not present

## 2022-02-21 DIAGNOSIS — D519 Vitamin B12 deficiency anemia, unspecified: Secondary | ICD-10-CM | POA: Diagnosis not present

## 2022-02-24 ENCOUNTER — Ambulatory Visit (HOSPITAL_COMMUNITY)
Admission: RE | Admit: 2022-02-24 | Discharge: 2022-02-24 | Disposition: A | Discharge status: Home / Self Care | Payer: Medicare HMO | Source: Ambulatory Visit | Attending: Neurosurgery | Admitting: Neurosurgery

## 2022-02-24 DIAGNOSIS — M549 Dorsalgia, unspecified: Secondary | ICD-10-CM | POA: Diagnosis not present

## 2022-02-24 DIAGNOSIS — M5126 Other intervertebral disc displacement, lumbar region: Secondary | ICD-10-CM | POA: Diagnosis not present

## 2022-02-25 DIAGNOSIS — Z8639 Personal history of other endocrine, nutritional and metabolic disease: Secondary | ICD-10-CM | POA: Diagnosis not present

## 2022-02-25 DIAGNOSIS — R350 Frequency of micturition: Secondary | ICD-10-CM | POA: Diagnosis not present

## 2022-02-25 DIAGNOSIS — R3589 Other polyuria: Secondary | ICD-10-CM | POA: Diagnosis not present

## 2022-02-28 DIAGNOSIS — Z681 Body mass index (BMI) 19 or less, adult: Secondary | ICD-10-CM | POA: Diagnosis not present

## 2022-02-28 DIAGNOSIS — M549 Dorsalgia, unspecified: Secondary | ICD-10-CM | POA: Diagnosis not present

## 2022-02-28 DIAGNOSIS — M47816 Spondylosis without myelopathy or radiculopathy, lumbar region: Secondary | ICD-10-CM | POA: Diagnosis not present

## 2022-02-28 DIAGNOSIS — S32010D Wedge compression fracture of first lumbar vertebra, subsequent encounter for fracture with routine healing: Secondary | ICD-10-CM | POA: Diagnosis not present

## 2022-03-01 DIAGNOSIS — M47816 Spondylosis without myelopathy or radiculopathy, lumbar region: Secondary | ICD-10-CM | POA: Diagnosis not present

## 2022-03-01 DIAGNOSIS — M533 Sacrococcygeal disorders, not elsewhere classified: Secondary | ICD-10-CM | POA: Diagnosis not present

## 2022-03-04 DIAGNOSIS — I10 Essential (primary) hypertension: Secondary | ICD-10-CM | POA: Diagnosis not present

## 2022-03-04 DIAGNOSIS — K5730 Diverticulosis of large intestine without perforation or abscess without bleeding: Secondary | ICD-10-CM | POA: Diagnosis not present

## 2022-03-04 DIAGNOSIS — E785 Hyperlipidemia, unspecified: Secondary | ICD-10-CM | POA: Diagnosis not present

## 2022-03-04 DIAGNOSIS — E079 Disorder of thyroid, unspecified: Secondary | ICD-10-CM | POA: Diagnosis not present

## 2022-03-04 DIAGNOSIS — Z743 Need for continuous supervision: Secondary | ICD-10-CM | POA: Diagnosis not present

## 2022-03-04 DIAGNOSIS — M47816 Spondylosis without myelopathy or radiculopathy, lumbar region: Secondary | ICD-10-CM | POA: Diagnosis not present

## 2022-03-04 DIAGNOSIS — R58 Hemorrhage, not elsewhere classified: Secondary | ICD-10-CM | POA: Diagnosis not present

## 2022-03-04 DIAGNOSIS — F17210 Nicotine dependence, cigarettes, uncomplicated: Secondary | ICD-10-CM | POA: Diagnosis not present

## 2022-03-04 DIAGNOSIS — R29898 Other symptoms and signs involving the musculoskeletal system: Secondary | ICD-10-CM | POA: Diagnosis not present

## 2022-03-04 DIAGNOSIS — J432 Centrilobular emphysema: Secondary | ICD-10-CM | POA: Diagnosis not present

## 2022-03-04 DIAGNOSIS — R296 Repeated falls: Secondary | ICD-10-CM | POA: Diagnosis not present

## 2022-03-04 DIAGNOSIS — M79603 Pain in arm, unspecified: Secondary | ICD-10-CM | POA: Diagnosis not present

## 2022-03-04 DIAGNOSIS — Z7984 Long term (current) use of oral hypoglycemic drugs: Secondary | ICD-10-CM | POA: Diagnosis not present

## 2022-03-04 DIAGNOSIS — I672 Cerebral atherosclerosis: Secondary | ICD-10-CM | POA: Diagnosis not present

## 2022-03-04 DIAGNOSIS — N200 Calculus of kidney: Secondary | ICD-10-CM | POA: Diagnosis not present

## 2022-03-04 DIAGNOSIS — S0990XA Unspecified injury of head, initial encounter: Secondary | ICD-10-CM | POA: Diagnosis not present

## 2022-03-04 DIAGNOSIS — R627 Adult failure to thrive: Secondary | ICD-10-CM | POA: Diagnosis not present

## 2022-03-04 DIAGNOSIS — J320 Chronic maxillary sinusitis: Secondary | ICD-10-CM | POA: Diagnosis not present

## 2022-03-04 DIAGNOSIS — R52 Pain, unspecified: Secondary | ICD-10-CM | POA: Diagnosis not present

## 2022-03-04 DIAGNOSIS — I700 Atherosclerosis of aorta: Secondary | ICD-10-CM | POA: Diagnosis not present

## 2022-03-04 DIAGNOSIS — R531 Weakness: Secondary | ICD-10-CM | POA: Diagnosis not present

## 2022-03-04 DIAGNOSIS — Z79890 Hormone replacement therapy (postmenopausal): Secondary | ICD-10-CM | POA: Diagnosis not present

## 2022-03-04 DIAGNOSIS — R911 Solitary pulmonary nodule: Secondary | ICD-10-CM | POA: Diagnosis not present

## 2022-03-04 DIAGNOSIS — W19XXXA Unspecified fall, initial encounter: Secondary | ICD-10-CM | POA: Diagnosis not present

## 2022-03-04 DIAGNOSIS — I3139 Other pericardial effusion (noninflammatory): Secondary | ICD-10-CM | POA: Diagnosis not present

## 2022-03-04 DIAGNOSIS — J439 Emphysema, unspecified: Secondary | ICD-10-CM | POA: Diagnosis not present

## 2022-03-04 DIAGNOSIS — R350 Frequency of micturition: Secondary | ICD-10-CM | POA: Diagnosis not present

## 2022-03-04 DIAGNOSIS — S20411A Abrasion of right back wall of thorax, initial encounter: Secondary | ICD-10-CM | POA: Diagnosis not present

## 2022-03-04 DIAGNOSIS — S199XXA Unspecified injury of neck, initial encounter: Secondary | ICD-10-CM | POA: Diagnosis not present

## 2022-03-04 DIAGNOSIS — N3000 Acute cystitis without hematuria: Secondary | ICD-10-CM | POA: Diagnosis not present

## 2022-03-04 DIAGNOSIS — M549 Dorsalgia, unspecified: Secondary | ICD-10-CM | POA: Diagnosis not present

## 2022-03-04 DIAGNOSIS — M47814 Spondylosis without myelopathy or radiculopathy, thoracic region: Secondary | ICD-10-CM | POA: Diagnosis not present

## 2022-03-04 DIAGNOSIS — R918 Other nonspecific abnormal finding of lung field: Secondary | ICD-10-CM | POA: Diagnosis not present

## 2022-03-04 DIAGNOSIS — M545 Low back pain, unspecified: Secondary | ICD-10-CM | POA: Diagnosis not present

## 2022-03-04 DIAGNOSIS — Z79899 Other long term (current) drug therapy: Secondary | ICD-10-CM | POA: Diagnosis not present

## 2022-03-04 DIAGNOSIS — S50311A Abrasion of right elbow, initial encounter: Secondary | ICD-10-CM | POA: Diagnosis not present

## 2022-03-04 DIAGNOSIS — S30810A Abrasion of lower back and pelvis, initial encounter: Secondary | ICD-10-CM | POA: Diagnosis not present

## 2022-03-04 DIAGNOSIS — E119 Type 2 diabetes mellitus without complications: Secondary | ICD-10-CM | POA: Diagnosis not present

## 2022-03-09 DIAGNOSIS — R300 Dysuria: Secondary | ICD-10-CM | POA: Diagnosis not present

## 2022-03-09 DIAGNOSIS — R110 Nausea: Secondary | ICD-10-CM | POA: Diagnosis not present

## 2022-03-09 DIAGNOSIS — I10 Essential (primary) hypertension: Secondary | ICD-10-CM | POA: Diagnosis not present

## 2022-03-09 DIAGNOSIS — D649 Anemia, unspecified: Secondary | ICD-10-CM | POA: Diagnosis not present

## 2022-03-09 DIAGNOSIS — I700 Atherosclerosis of aorta: Secondary | ICD-10-CM | POA: Diagnosis not present

## 2022-03-09 DIAGNOSIS — Z8673 Personal history of transient ischemic attack (TIA), and cerebral infarction without residual deficits: Secondary | ICD-10-CM | POA: Diagnosis not present

## 2022-03-09 DIAGNOSIS — R627 Adult failure to thrive: Secondary | ICD-10-CM | POA: Insufficient documentation

## 2022-03-09 DIAGNOSIS — R7989 Other specified abnormal findings of blood chemistry: Secondary | ICD-10-CM | POA: Diagnosis not present

## 2022-03-09 DIAGNOSIS — Z681 Body mass index (BMI) 19 or less, adult: Secondary | ICD-10-CM | POA: Diagnosis not present

## 2022-03-09 DIAGNOSIS — F17210 Nicotine dependence, cigarettes, uncomplicated: Secondary | ICD-10-CM | POA: Diagnosis not present

## 2022-03-09 DIAGNOSIS — R296 Repeated falls: Secondary | ICD-10-CM | POA: Diagnosis not present

## 2022-03-09 DIAGNOSIS — E1165 Type 2 diabetes mellitus with hyperglycemia: Secondary | ICD-10-CM | POA: Diagnosis not present

## 2022-03-10 ENCOUNTER — Inpatient Hospital Stay (HOSPITAL_COMMUNITY)
Admission: EM | Admit: 2022-03-10 | Discharge: 2022-03-16 | DRG: 640 | Disposition: A | Discharge status: Skilled Nursing Facility | Payer: Medicare HMO | Attending: Internal Medicine | Admitting: Internal Medicine

## 2022-03-10 ENCOUNTER — Other Ambulatory Visit: Payer: Self-pay

## 2022-03-10 ENCOUNTER — Emergency Department (HOSPITAL_COMMUNITY): Payer: Medicare HMO

## 2022-03-10 ENCOUNTER — Encounter (HOSPITAL_COMMUNITY): Payer: Self-pay

## 2022-03-10 DIAGNOSIS — E876 Hypokalemia: Secondary | ICD-10-CM | POA: Diagnosis not present

## 2022-03-10 DIAGNOSIS — Z8673 Personal history of transient ischemic attack (TIA), and cerebral infarction without residual deficits: Secondary | ICD-10-CM

## 2022-03-10 DIAGNOSIS — F17210 Nicotine dependence, cigarettes, uncomplicated: Secondary | ICD-10-CM | POA: Diagnosis not present

## 2022-03-10 DIAGNOSIS — R54 Age-related physical debility: Secondary | ICD-10-CM | POA: Diagnosis not present

## 2022-03-10 DIAGNOSIS — E43 Unspecified severe protein-calorie malnutrition: Secondary | ICD-10-CM | POA: Diagnosis present

## 2022-03-10 DIAGNOSIS — R296 Repeated falls: Secondary | ICD-10-CM | POA: Diagnosis not present

## 2022-03-10 DIAGNOSIS — E1165 Type 2 diabetes mellitus with hyperglycemia: Secondary | ICD-10-CM | POA: Diagnosis not present

## 2022-03-10 DIAGNOSIS — R627 Adult failure to thrive: Secondary | ICD-10-CM | POA: Diagnosis present

## 2022-03-10 DIAGNOSIS — K219 Gastro-esophageal reflux disease without esophagitis: Secondary | ICD-10-CM | POA: Diagnosis present

## 2022-03-10 DIAGNOSIS — I10 Essential (primary) hypertension: Secondary | ICD-10-CM | POA: Diagnosis not present

## 2022-03-10 DIAGNOSIS — E785 Hyperlipidemia, unspecified: Secondary | ICD-10-CM | POA: Diagnosis present

## 2022-03-10 DIAGNOSIS — I2510 Atherosclerotic heart disease of native coronary artery without angina pectoris: Secondary | ICD-10-CM | POA: Diagnosis present

## 2022-03-10 DIAGNOSIS — Z602 Problems related to living alone: Secondary | ICD-10-CM | POA: Diagnosis present

## 2022-03-10 DIAGNOSIS — W19XXXA Unspecified fall, initial encounter: Secondary | ICD-10-CM

## 2022-03-10 DIAGNOSIS — D509 Iron deficiency anemia, unspecified: Secondary | ICD-10-CM | POA: Diagnosis not present

## 2022-03-10 DIAGNOSIS — Z681 Body mass index (BMI) 19 or less, adult: Secondary | ICD-10-CM | POA: Diagnosis not present

## 2022-03-10 DIAGNOSIS — R69 Illness, unspecified: Secondary | ICD-10-CM | POA: Diagnosis not present

## 2022-03-10 DIAGNOSIS — E1169 Type 2 diabetes mellitus with other specified complication: Secondary | ICD-10-CM | POA: Diagnosis not present

## 2022-03-10 DIAGNOSIS — J439 Emphysema, unspecified: Secondary | ICD-10-CM | POA: Diagnosis not present

## 2022-03-10 DIAGNOSIS — I951 Orthostatic hypotension: Secondary | ICD-10-CM | POA: Diagnosis not present

## 2022-03-10 DIAGNOSIS — S199XXA Unspecified injury of neck, initial encounter: Secondary | ICD-10-CM | POA: Diagnosis not present

## 2022-03-10 DIAGNOSIS — R102 Pelvic and perineal pain: Secondary | ICD-10-CM | POA: Diagnosis not present

## 2022-03-10 DIAGNOSIS — E039 Hypothyroidism, unspecified: Secondary | ICD-10-CM | POA: Diagnosis present

## 2022-03-10 DIAGNOSIS — E860 Dehydration: Secondary | ICD-10-CM | POA: Diagnosis not present

## 2022-03-10 DIAGNOSIS — Z66 Do not resuscitate: Secondary | ICD-10-CM | POA: Diagnosis not present

## 2022-03-10 DIAGNOSIS — E46 Unspecified protein-calorie malnutrition: Secondary | ICD-10-CM | POA: Diagnosis not present

## 2022-03-10 DIAGNOSIS — Z7984 Long term (current) use of oral hypoglycemic drugs: Secondary | ICD-10-CM

## 2022-03-10 DIAGNOSIS — R55 Syncope and collapse: Secondary | ICD-10-CM

## 2022-03-10 DIAGNOSIS — R634 Abnormal weight loss: Secondary | ICD-10-CM | POA: Diagnosis not present

## 2022-03-10 DIAGNOSIS — S0990XA Unspecified injury of head, initial encounter: Secondary | ICD-10-CM | POA: Diagnosis not present

## 2022-03-10 DIAGNOSIS — Z7902 Long term (current) use of antithrombotics/antiplatelets: Secondary | ICD-10-CM | POA: Diagnosis not present

## 2022-03-10 DIAGNOSIS — Z79899 Other long term (current) drug therapy: Secondary | ICD-10-CM

## 2022-03-10 DIAGNOSIS — R079 Chest pain, unspecified: Secondary | ICD-10-CM | POA: Diagnosis not present

## 2022-03-10 DIAGNOSIS — F32A Depression, unspecified: Secondary | ICD-10-CM | POA: Diagnosis present

## 2022-03-10 DIAGNOSIS — Z20822 Contact with and (suspected) exposure to covid-19: Secondary | ICD-10-CM | POA: Diagnosis not present

## 2022-03-10 DIAGNOSIS — R7402 Elevation of levels of lactic acid dehydrogenase (LDH): Secondary | ICD-10-CM | POA: Diagnosis not present

## 2022-03-10 DIAGNOSIS — N179 Acute kidney failure, unspecified: Secondary | ICD-10-CM | POA: Diagnosis present

## 2022-03-10 DIAGNOSIS — E878 Other disorders of electrolyte and fluid balance, not elsewhere classified: Secondary | ICD-10-CM | POA: Diagnosis not present

## 2022-03-10 DIAGNOSIS — E119 Type 2 diabetes mellitus without complications: Secondary | ICD-10-CM | POA: Diagnosis not present

## 2022-03-10 DIAGNOSIS — R109 Unspecified abdominal pain: Secondary | ICD-10-CM | POA: Diagnosis not present

## 2022-03-10 LAB — URINALYSIS, ROUTINE W REFLEX MICROSCOPIC
Bacteria, UA: NONE SEEN
Bilirubin Urine: NEGATIVE
Glucose, UA: NEGATIVE mg/dL
Hgb urine dipstick: NEGATIVE
Ketones, ur: NEGATIVE mg/dL
Leukocytes,Ua: NEGATIVE
Nitrite: NEGATIVE
Protein, ur: 100 mg/dL — AB
Specific Gravity, Urine: 1.013 (ref 1.005–1.030)
pH: 5 (ref 5.0–8.0)

## 2022-03-10 LAB — RESP PANEL BY RT-PCR (FLU A&B, COVID) ARPGX2
Influenza A by PCR: NEGATIVE
Influenza B by PCR: NEGATIVE
SARS Coronavirus 2 by RT PCR: NEGATIVE

## 2022-03-10 LAB — LACTIC ACID, PLASMA
Lactic Acid, Venous: 2.8 mmol/L (ref 0.5–1.9)
Lactic Acid, Venous: 3.7 mmol/L (ref 0.5–1.9)
Lactic Acid, Venous: 3.9 mmol/L (ref 0.5–1.9)

## 2022-03-10 LAB — CBC
HCT: 30.9 % — ABNORMAL LOW (ref 36.0–46.0)
Hemoglobin: 9.9 g/dL — ABNORMAL LOW (ref 12.0–15.0)
MCH: 31.3 pg (ref 26.0–34.0)
MCHC: 32 g/dL (ref 30.0–36.0)
MCV: 97.8 fL (ref 80.0–100.0)
Platelets: 394 10*3/uL (ref 150–400)
RBC: 3.16 MIL/uL — ABNORMAL LOW (ref 3.87–5.11)
RDW: 13.3 % (ref 11.5–15.5)
WBC: 11.1 10*3/uL — ABNORMAL HIGH (ref 4.0–10.5)
nRBC: 0 % (ref 0.0–0.2)

## 2022-03-10 LAB — COMPREHENSIVE METABOLIC PANEL
ALT: 24 U/L (ref 0–44)
AST: 20 U/L (ref 15–41)
Albumin: 3.7 g/dL (ref 3.5–5.0)
Alkaline Phosphatase: 102 U/L (ref 38–126)
Anion gap: 9 (ref 5–15)
BUN: 59 mg/dL — ABNORMAL HIGH (ref 8–23)
CO2: 20 mmol/L — ABNORMAL LOW (ref 22–32)
Calcium: 11.9 mg/dL — ABNORMAL HIGH (ref 8.9–10.3)
Chloride: 107 mmol/L (ref 98–111)
Creatinine, Ser: 1.56 mg/dL — ABNORMAL HIGH (ref 0.44–1.00)
GFR, Estimated: 36 mL/min — ABNORMAL LOW (ref >60)
Glucose, Bld: 261 mg/dL — ABNORMAL HIGH (ref 70–99)
Potassium: 4.7 mmol/L (ref 3.5–5.1)
Sodium: 136 mmol/L (ref 135–145)
Total Bilirubin: 0.4 mg/dL (ref 0.3–1.2)
Total Protein: 7 g/dL (ref 6.5–8.1)

## 2022-03-10 LAB — PHOSPHORUS: Phosphorus: <1 mg/dL — CL (ref 2.5–4.6)

## 2022-03-10 LAB — I-STAT CHEM 8, ED
BUN: 58 mg/dL — ABNORMAL HIGH (ref 8–23)
Calcium, Ion: 1.56 mmol/L (ref 1.15–1.40)
Chloride: 108 mmol/L (ref 98–111)
Creatinine, Ser: 1.5 mg/dL — ABNORMAL HIGH (ref 0.44–1.00)
Glucose, Bld: 265 mg/dL — ABNORMAL HIGH (ref 70–99)
HCT: 30 % — ABNORMAL LOW (ref 36.0–46.0)
Hemoglobin: 10.2 g/dL — ABNORMAL LOW (ref 12.0–15.0)
Potassium: 4.7 mmol/L (ref 3.5–5.1)
Sodium: 137 mmol/L (ref 135–145)
TCO2: 21 mmol/L — ABNORMAL LOW (ref 22–32)

## 2022-03-10 LAB — VITAMIN B12: Vitamin B-12: 3372 pg/mL — ABNORMAL HIGH (ref 180–914)

## 2022-03-10 LAB — CBG MONITORING, ED: Glucose-Capillary: 86 mg/dL (ref 70–99)

## 2022-03-10 LAB — PROTIME-INR
INR: 1.2 (ref 0.8–1.2)
Prothrombin Time: 14.8 seconds (ref 11.4–15.2)

## 2022-03-10 LAB — ETHANOL: Alcohol, Ethyl (B): <10 mg/dL (ref <10)

## 2022-03-10 LAB — TSH: TSH: 1.825 u[IU]/mL (ref 0.350–4.500)

## 2022-03-10 LAB — SAMPLE TO BLOOD BANK

## 2022-03-10 LAB — HIV ANTIBODY (ROUTINE TESTING W REFLEX): HIV Screen 4th Generation wRfx: NONREACTIVE

## 2022-03-10 LAB — GLUCOSE, CAPILLARY: Glucose-Capillary: 183 mg/dL — ABNORMAL HIGH (ref 70–99)

## 2022-03-10 LAB — MAGNESIUM: Magnesium: 1.2 mg/dL — ABNORMAL LOW (ref 1.7–2.4)

## 2022-03-10 MED ORDER — MAGNESIUM SULFATE 2 GM/50ML IV SOLN
2.0000 g | Freq: Once | INTRAVENOUS | Status: AC
  Administered 2022-03-10: 2 g via INTRAVENOUS
  Filled 2022-03-10: qty 50

## 2022-03-10 MED ORDER — ALPRAZOLAM 0.25 MG PO TABS
1.0000 mg | ORAL_TABLET | Freq: Two times a day (BID) | ORAL | Status: DC
  Administered 2022-03-10 – 2022-03-16 (×12): 1 mg via ORAL
  Filled 2022-03-10 (×12): qty 4

## 2022-03-10 MED ORDER — SODIUM PHOSPHATES 45 MMOLE/15ML IV SOLN
20.0000 mmol | Freq: Once | INTRAVENOUS | Status: AC
  Administered 2022-03-10: 20 mmol via INTRAVENOUS
  Filled 2022-03-10: qty 6.67

## 2022-03-10 MED ORDER — PANTOPRAZOLE SODIUM 40 MG PO TBEC
40.0000 mg | DELAYED_RELEASE_TABLET | Freq: Two times a day (BID) | ORAL | Status: DC
  Administered 2022-03-10 – 2022-03-16 (×12): 40 mg via ORAL
  Filled 2022-03-10 (×12): qty 1

## 2022-03-10 MED ORDER — CLOPIDOGREL BISULFATE 75 MG PO TABS
75.0000 mg | ORAL_TABLET | Freq: Every day | ORAL | Status: DC
  Administered 2022-03-11 – 2022-03-16 (×6): 75 mg via ORAL
  Filled 2022-03-10 (×6): qty 1

## 2022-03-10 MED ORDER — SODIUM CHLORIDE 0.9 % IV BOLUS
1000.0000 mL | Freq: Once | INTRAVENOUS | Status: AC
  Administered 2022-03-10: 1000 mL via INTRAVENOUS

## 2022-03-10 MED ORDER — AMLODIPINE BESYLATE 5 MG PO TABS
5.0000 mg | ORAL_TABLET | Freq: Every day | ORAL | Status: DC
  Administered 2022-03-11 – 2022-03-16 (×6): 5 mg via ORAL
  Filled 2022-03-10 (×6): qty 1

## 2022-03-10 MED ORDER — INSULIN ASPART 100 UNIT/ML IJ SOLN
0.0000 [IU] | INTRAMUSCULAR | Status: DC
  Administered 2022-03-11: 2 [IU] via SUBCUTANEOUS
  Administered 2022-03-11: 1 [IU] via SUBCUTANEOUS
  Administered 2022-03-11: 2 [IU] via SUBCUTANEOUS
  Administered 2022-03-12: 1 [IU] via SUBCUTANEOUS
  Administered 2022-03-12: 2 [IU] via SUBCUTANEOUS

## 2022-03-10 MED ORDER — LACTATED RINGERS IV SOLN: INTRAVENOUS | Status: AC

## 2022-03-10 MED ORDER — LISINOPRIL 10 MG PO TABS: 10.0000 mg | ORAL_TABLET | Freq: Every day | ORAL | Status: DC

## 2022-03-10 MED ORDER — ADULT MULTIVITAMIN W/MINERALS CH
1.0000 | ORAL_TABLET | Freq: Every day | ORAL | Status: DC
  Administered 2022-03-11 – 2022-03-16 (×6): 1 via ORAL
  Filled 2022-03-10 (×6): qty 1

## 2022-03-10 MED ORDER — LACTATED RINGERS IV BOLUS
1000.0000 mL | Freq: Once | INTRAVENOUS | Status: AC
Start: 2022-03-10 — End: 2022-03-10
  Administered 2022-03-10: 1000 mL via INTRAVENOUS

## 2022-03-10 MED ORDER — SODIUM CHLORIDE 0.9 % IV SOLN: INTRAVENOUS | Status: DC

## 2022-03-10 MED ORDER — FERROUS SULFATE 325 (65 FE) MG PO TABS
325.0000 mg | ORAL_TABLET | Freq: Two times a day (BID) | ORAL | Status: DC
  Administered 2022-03-11 – 2022-03-16 (×11): 325 mg via ORAL
  Filled 2022-03-10 (×11): qty 1

## 2022-03-10 MED ORDER — ESCITALOPRAM OXALATE 10 MG PO TABS
10.0000 mg | ORAL_TABLET | Freq: Every day | ORAL | Status: DC
  Administered 2022-03-11 – 2022-03-16 (×6): 10 mg via ORAL
  Filled 2022-03-10 (×6): qty 1

## 2022-03-10 NOTE — ED Triage Notes (Addendum)
Pt arrived POV from home c/o 5 falls in the last 48hrs. Pt states the last fall she had this morning she did bump her head. Pt is on Plavix. Pt c/o mid back pain.

## 2022-03-10 NOTE — ED Notes (Signed)
6N called to start handoff process.

## 2022-03-10 NOTE — H&P (Addendum)
Attestation for Student Documentation:  I personally was present and performed or re-performed the history, physical exam and medical decision-making activities of this service and have verified that the service and findings are accurately documented in the student's note.  Ms Jedlicka is a 66 year old person living with a history of T2DM, HTN, CVAx2 without residual deficits, HLD, and depression presents for evaluation of frequent falls, decreased appetite and weight loss. She was recently evaluated at [facility] ED in [place] for similar concerns and found to have hypomagnesemia however she declined admission after IV fluids. She had multiple CT scans performed with findings of a 7m noncalcified left lower lung nodule that will need follow up in 12 months, severe coronary artery calcification. She was discharged on macrobid for suspected UTI. Since then, she has had increasing falls and [facility]. She denies loss of consciousness with these episodes. On examination she is afebrile and normotensive. Thin and frail appearing with temporal wasting. Otherwise exam is unremarkable.   Work up with pertinent findings of WBC of 11.1, normocytic anemia, AKI of 1.5, baseline of under 1.0. Hypomagnesemia, hypercalcemia of 12 with ionized calcium of 1.56. Hyperlactatemia of 3.4.   Assessment and Plan  Hyperlactatemia Acute kidney Injury Likely secondary to severe dehydration, received 2 bolus of IV fluids, receiving third and will continue maintenance fluids. She has had poor po intake over the last few month with worsening weight loss. Hemodynamically stable, afebrile, no evidence of infectious etiology, will continue to monitor.   Frequent falls Likely secondary to weakness from her nutritional deficits and orthostatic hypotension from her poor PO intake. PT/OT consult and will replenish electrolytes. Do not suspect vasovagal or cardiogenic syncope. Will keep on cardiac  monitor during hospitalization with severely abnormal electrolytes.   Hypercalcemia Hypomagnesemia Hypophosphatemia [place]plen[place] mag[place]. Appreciate pharmacy's assistance in ordering IV phos repletion. Will need repeat levels in 12 hours. Calcium over the past year has been elevated and PTH in the past has been low. Will repeat PTH. Do not see further evaluation with vitamin D levels or PTHrH.  Weight loss Poor po intake Patient with significant weight loss over the past year. Denies night sweats or Hx of primary malignancy. States she is up to date on mammogram and colon cancer screening (colo 2022 with multiple polyps recommended follow up in 5 years). Does have significant tobacco use history, low dose CT with 422mnodule noted and will need outpatient follow up, recommended repeat in 1 year. Difficult to assess if this is secondary to poor PO intake. She has had chest abdomen pelvis CT that was negative. With hypercalcemia will order PTH.  TSH normal.   Hx of depression Per son patient with long standing history of admission and had hospitalization for suicide many years ago. He notices that she has fluctuations in her mood and appetite and feels depression may be contributing to above.    Normocytic Anemia Iron studies, ferritin, and b12 pending  KaRiesa PopeMD 03/10/2022, 8:05 PM     Date: 03/10/2022               Patient Name:  BeANALEI WHINERYRN: [id number]: 7/10-14-1957ge / Sex: [age].o., female   PCP: [facility]            Medical Service: Internal Medicine Teaching Service              Attending  Physician: Dr. Johnnye Sima, Doroteo Bradford, MD    First Contact: Hennie Duos, MS 3 Pager: [phone]  Second Contact: Dr. 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 Patient reports she has fallen several times over the last few weeks. Denies LOC and does not know what is causing her to fall.   Last episode was this morning when she was standing in the kitchen and fell to the ground. Her son found her face down. States she did hit the left side of her head but never lost consciousness. Does not feel like she's losing her balance or trips on anything. States she has felt lightheaded recently, sometimes brought on by standing from a seated position very quickly. States she does not feel like she's going to have syncopal episode with sneezing, coughing, or bearing down. Patient normally walks with the aid of a walker. She denies dizziness, palpitations, chest pain, shortness of breath, numbness/tingling, hematuria, hematemesis, fever, chills, dysuria, abdominal pain, nausea, vomiting, diarrhea, or recent sick contacts. Patient was seen in [place] ED on 08/18 for similar episode when she had negative imaging. It was recommended she be admitted for electrolyte derangements which patient refused.   Patient notes 1 year of steady weight loss. States she has lost a total of 7 pounds in the last month and has a decreased appetite of unclear etiology. Denies dysphagia. Does not have a history of cancer. Also notes she has not been drinking much fluid at home.  Son notes patient has had some changes in her speech, occasionally slurring her words when he speaks with her on the phone. Notes her speech makes sense during some of these episodes, while at other times it does not. Also reports he occasionally catches patient staring off into space.   Denies recent illness, chest pain, shortness of breath, abdominal pain, n/v/d, changes in urinary habits.   Meds:  Current Meds  Medication Sig    ALPRAZolam (XANAX) 1 MG tablet Take 1 mg by mouth 2 (two) times daily.   amLODipine (NORVASC) 5 MG tablet Take 5 mg by mouth daily.   clopidogrel (PLAVIX) 75 MG tablet Take 75 mg by mouth daily.   Cyanocobalamin (VITAMIN B12 PO) Take 1 tablet by mouth daily.   escitalopram (LEXAPRO) 10 MG tablet Take 10 mg by mouth daily.   FEROSUL 325 (65 Fe) MG tablet Take 325 mg by mouth 2 (two) times daily with a meal.   glipiZIDE (GLUCOTROL XL) 5 MG 24 hr tablet Take 5 mg by mouth daily.   lisinopril (ZESTRIL) 10 MG tablet Take 10 mg by mouth daily.   MAGNESIUM PO Take 1 tablet by mouth daily.   metFORMIN (GLUCOPHAGE) 1000 MG tablet Take 1,000 mg by mouth 2 (two) times daily with a meal.   Multiple Vitamin (MULTIVITAMIN WITH MINERALS) TABS tablet Take 1 tablet by mouth daily.   pantoprazole (PROTONIX) 40 MG tablet TAKE 1 TABLET(40 MG) BY MOUTH TWICE DAILY (Patient taking differently: Take 40 mg by mouth 2 (two) times daily.)   Probiotic Product (PROBIOTIC PO) Take 1 tablet by mouth daily.   sitaGLIPtin (JANUVIA) 50 MG tablet Take  50 mg by mouth daily.   Allergies: Allergies as of 03/10/2022   (No Known Allergies)   Past Medical History:  Diagnosis Date   Carotid artery disease (HCC)    COPD (chronic obstructive pulmonary disease) (HCC)    Coronary atherosclerosis of native coronary artery    [facility]; negative Dobutamine echo 6/11   Depression    Essential hypertension    Hyperlipidemia    Hypothyroidism    Stroke [facility]) 2020   Stroke [facility]) 2014   Type 2 diabetes mellitus ([place])    Social History:  Lives alone Able to perform ADL/IADL Her son lives nearby PCP [employer] Smokes 1 pack daily for the last 20 years Denies alcohol use Denies illicit drug use   Review of Systems: A complete ROS was negative except as per HPI.   Physical Exam: Blood pressure (!) 142/74, pulse 97, temperature 98.6 F (37 C), temperature source Oral, resp. rate 20, height '5\' 2"'$  (1.575 m), weight 34  kg, SpO2 100 %.   Physical Exam GENERAL:     Cachectic, thin, frail no acute distress  HENT:     Head: Small ecchymosis and swelling over left eyebrow. Temporal wasting     Mouth: Mucous membranes are moist.  Eyes:     Extraocular Movements: Extraocular movements intact.     Conjunctiva/sclera: Conjunctivae normal.     Pupils: Pupils are equal, round, and reactive to light.  Cardiovascular:     Rate and Rhythm: Normal rate and regular rhythm.     Pulses: Normal pulses.     Heart sounds: Normal heart sounds. No murmur heard.    No friction rub. No gallop.  Pulmonary:     Effort: Pulmonary effort is normal.     Breath sounds: Normal breath sounds.  Abdominal:     General: Abdomen is flat. There is no distension.     Palpations: Abdomen is soft.     Tenderness: There is no abdominal tenderness.  Skin:    General: Skin is warm and dry.  Neurological:     General: No focal deficit present.     Mental Status: She is alert and oriented to person, place, and time.     Cranial Nerves: No cranial nerve deficit.     Sensory: No sensory deficit.     Motor: No weakness.  Psychiatric:        Mood and Affect: Mood normal.        Behavior: Behavior normal.   Assessment & Plan by Problem:  Ms. Cumber is a 66 y/o F living with a history of T2DM, tobacco use disorder, CVAx2, HLD, and depression presented after multiple falls and admitted for further evaluation and electrolyte derangements.   Frequent falls Given patient's cachetic appearance and admittance of poor appetite over the last year, fall could be attributed to generalized weakness. Episodes are usually not preceded by any positional changes, bearing down, sneezing, coughing, palpitations, or shortness of breath making syncopal episodes less likely. Further, she notes she is fully conscious during these episodes. Pt is on a sulfonylurea, but blood glucose was elevated at 265 on arrival making hypoglycemic syncopal event less likely.    Electrolyte derangement Magnesium found low at 1.2. Likely related to malnutrition. Recheck after several days of adequate PO intake.  Pt also found to have elevated calcium at 1.56. PTH pending  Decreased appetite Pts notes a long history of depression, once requiring hospitalization many year ago. Suspect this may be the cause of decreased  appetite and weight loss, as patient denies any related abdominal pain, nausea or vomiting. She is currently on SSRI. Monitor patients PO intake while in the hospital. May need further evaluation of social factors that could be contributing to depression and possible therapy/counseling.   AKI Lab reveal elevated BUN and creatinine. Likely pre-renal in nature as patient has had poor hydration at home. Plan to treat with IV fluids and reevaluate.   Hyperlactatemia Pt found with elevated lactic acid at 2.8. Possible etiology is inadequate hydration as patient noted she has not been taking in as much water as usually. Plan to start on IV fluids and reevaluate.   HTN Continue home regimen  Hx of CVA Continue plavix, no neurological deficits  Dispo: Admit patient to Inpatient with expected length of stay greater than 2 midnights.  Signed: Hennie Duos I, Medical Student 03/10/2022, 4:28 PM

## 2022-03-10 NOTE — ED Notes (Signed)
Patient transported to CT 

## 2022-03-10 NOTE — ED Provider Notes (Signed)
[facility] EMERGENCY DEPARTMENT Provider Note   CSN: [id number] Arrival date & time: 03/10/22  1136     History  Chief Complaint  Patient presents with   Lytle Michaels    Ashlee Austin is a 66 y.o. female.  Pt is a 66 yo female with a pmhx significant for DM2, Hypothyroidism, HLD, COPD, CAD, HTN, and hx CVA.  Pt has had several falls in the past few days.  Pt does not know what causes her to fall.  Pt went ED in [place] on 8/18 for the falls after a neighbor found her outside in the yard on the ground.  They did labs and CT scans of the head, cervical, thoracic, and lumbar spine which did not show a fx.  They also did a CT of her chest/abd/pelvis which did not show anything acute.  Mg was low at 1.2 and she had a UTI.  They recommended admission, but pt wanted to go home.  Pt did f/u with her PCP earlier this week and was told to drink more fluids.  She has had several falls in the last 24 hrs.  She did hit her head.  She is on Plavix, but no blood thinners.  She has generalized weakness.  Son said she's lost a lot of weight.  She said she's had no appetite.       Home Medications Prior to Admission medications   Medication Sig Start Date End Date Taking? Authorizing Provider  ALPRAZolam Duanne Moron) 1 MG tablet Take 1 mg by mouth 2 (two) times daily. 10/25/19  Yes [provider]  amLODipine (NORVASC) 5 MG tablet Take 5 mg by mouth daily. 02/17/22  Yes [provider]  clopidogrel (PLAVIX) 75 MG tablet Take 75 mg by mouth daily. 10/21/19  Yes [provider]  Cyanocobalamin (VITAMIN B12 PO) Take 1 tablet by mouth daily.   Yes [provider]  escitalopram (LEXAPRO) 10 MG tablet Take 10 mg by mouth daily. 02/17/22  Yes [provider]  FEROSUL 325 (65 Fe) MG tablet Take 325 mg by mouth 2 (two) times daily with a meal. 01/20/22  Yes [provider]  glipiZIDE (GLUCOTROL XL) 5 MG 24 hr tablet Take 5 mg by mouth daily. 02/08/22  Yes  [provider]  lisinopril (ZESTRIL) 10 MG tablet Take 10 mg by mouth daily.   Yes [provider]  MAGNESIUM PO Take 1 tablet by mouth daily.   Yes [provider]  metFORMIN (GLUCOPHAGE) 1000 MG tablet Take 1,000 mg by mouth 2 (two) times daily with a meal.   Yes [provider]  Multiple Vitamin (MULTIVITAMIN WITH MINERALS) TABS tablet Take 1 tablet by mouth daily.   Yes [provider]  pantoprazole (PROTONIX) 40 MG tablet TAKE 1 TABLET(40 MG) BY MOUTH TWICE DAILY Patient taking differently: Take 40 mg by mouth 2 (two) times daily. 10/05/21  Yes [name], DO  Probiotic Product (PROBIOTIC PO) Take 1 tablet by mouth daily.   Yes [provider]  sitaGLIPtin (JANUVIA) 50 MG tablet Take 50 mg by mouth daily.   Yes [provider]      Allergies    Patient has no known allergies.    Review of Systems   Review of Systems  Constitutional:  Positive for appetite change and unexpected weight change.  Neurological:  Positive for syncope and weakness.  All other systems reviewed and are negative.   Physical Exam Updated Vital Signs BP (!) 142/74  Pulse 97   Temp 98.6 F (37 C) (Oral)   Resp 20   Ht $R'5\' 2"'RK$  (1.575 m)   Wt 34 kg   SpO2 100%   BMI 13.72 kg/m  Physical Exam Vitals and nursing note reviewed.  Constitutional:      Appearance: She is underweight.  HENT:     Head: Normocephalic and atraumatic.      Right Ear: External ear normal.     Left Ear: External ear normal.     Nose: Nose normal.     Mouth/Throat:     Mouth: Mucous membranes are dry.  Eyes:     Extraocular Movements: Extraocular movements intact.     Conjunctiva/sclera: Conjunctivae normal.     Pupils: Pupils are equal, round, and reactive to light.  Cardiovascular:     Rate and Rhythm: Normal rate and regular rhythm.     Pulses: Normal pulses.     Heart sounds: Normal heart sounds.  Pulmonary:     Effort: Pulmonary effort is normal.      Breath sounds: Normal breath sounds.  Abdominal:     General: Abdomen is flat. Bowel sounds are normal.     Palpations: Abdomen is soft.  Musculoskeletal:        General: Normal range of motion.  Skin:    General: Skin is warm.     Capillary Refill: Capillary refill takes less than 2 seconds.  Neurological:     General: No focal deficit present.     Mental Status: She is alert and oriented to person, place, and time.  Psychiatric:        Mood and Affect: Mood normal.        Behavior: Behavior normal.     ED Results / Procedures / Treatments   Labs (all labs ordered are listed, but only abnormal results are displayed) Labs Reviewed  COMPREHENSIVE METABOLIC PANEL - Abnormal; Notable for the following components:      Result Value   CO2 20 (*)    Glucose, Bld 261 (*)    BUN 59 (*)    Creatinine, Ser 1.56 (*)    Calcium 11.9 (*)    GFR, Estimated 36 (*)    All other components within normal limits  CBC - Abnormal; Notable for the following components:   WBC 11.1 (*)    RBC 3.16 (*)    Hemoglobin 9.9 (*)    HCT 30.9 (*)    All other components within normal limits  URINALYSIS, ROUTINE W REFLEX MICROSCOPIC - Abnormal; Notable for the following components:   APPearance HAZY (*)    Protein, ur 100 (*)    All other components within normal limits  LACTIC ACID, PLASMA - Abnormal; Notable for the following components:   Lactic Acid, Venous 2.8 (*)    All other components within normal limits  MAGNESIUM - Abnormal; Notable for the following components:   Magnesium 1.2 (*)    All other components within normal limits  LACTIC ACID, PLASMA - Abnormal; Notable for the following components:   Lactic Acid, Venous 3.7 (*)    All other components within normal limits  I-STAT CHEM 8, ED - Abnormal; Notable for the following components:   BUN 58 (*)    Creatinine, Ser 1.50 (*)    Glucose, Bld 265 (*)    Calcium, Ion 1.56 (*)    TCO2 21 (*)    Hemoglobin 10.2 (*)    HCT 30.0  (*)    All other  components within normal limits  RESP PANEL BY RT-PCR (FLU A&B, COVID) ARPGX2  ETHANOL  PROTIME-INR  TSH  LACTIC ACID, PLASMA  HIV ANTIBODY (ROUTINE TESTING W REFLEX)  HEMOGLOBIN A1C  PHOSPHORUS  VITAMIN B12  SAMPLE TO BLOOD BANK    EKG EKG Interpretation  Date/Time:  Thursday March 10 2022 13:27:32 EDT Ventricular Rate:  92 PR Interval:    QRS Duration: 92 QT Interval:  351 QTC Calculation: 435 R Axis:   99 Text Interpretation: Normal sinus rhythm Right axis deviation Nonspecific T abnormalities, lateral leads Minimal ST elevation, anterior leads No significant change since last tracing Confirmed by Isla Pence [phone]) on 03/10/2022 3:09:54 PM  Radiology CT HEAD WO CONTRAST  Result Date: 03/10/2022 CLINICAL DATA:  Trauma EXAM: CT HEAD WITHOUT CONTRAST CT CERVICAL SPINE WITHOUT CONTRAST TECHNIQUE: Multidetector CT imaging of the head and cervical spine was performed following the standard protocol without intravenous contrast. Multiplanar CT image reconstructions of the cervical spine were also generated. RADIATION DOSE REDUCTION: This exam was performed according to the departmental dose-optimization program which includes automated exposure control, adjustment of the mA and/or kV according to patient size and/or use of iterative reconstruction technique. COMPARISON:  CT head and CT cervical spine 03/04/2022 FINDINGS: CT HEAD FINDINGS Brain: There is no acute intracranial hemorrhage, extra-axial fluid collection, or acute infarct. Parenchymal volume is normal. The ventricles are stable in size. Gray-white differentiation is preserved. There is patchy hypodensity in the supratentorial white matter likely reflecting sequela of chronic white matter microangiopathy, unchanged. There is no mass lesion.  There is no mass effect or midline shift. Vascular: There is calcification of the bilateral cavernous ICAs. Skull: Normal. Negative for fracture or focal lesion.  Sinuses/Orbits: Complete opacification of the right maxillary sinus with surrounding hyperostosis is consistent with chronic sinusitis, unchanged. The globes and orbits are unremarkable. Other: None. CT CERVICAL SPINE FINDINGS Alignment: Normal. There is no antero or retrolisthesis. There is no evidence of traumatic malalignment. Skull base and vertebrae: Skull base alignment is maintained. Vertebral body heights are preserved. There is no evidence of acute fracture. There is no suspicious osseous lesion. Soft tissues and spinal canal: No prevertebral fluid or swelling. No visible canal hematoma. Disc levels: There is minimal degenerative change with no significant spinal canal or neural foraminal stenosis. Upper chest: The imaged lung apices are clear. Other: None. IMPRESSION: 1. No acute intracranial pathology. 2. No acute fracture or traumatic malalignment of the cervical spine. Electronically Signed   By: Valetta Mole M.D.   On: 03/10/2022 13:11   CT CERVICAL SPINE WO CONTRAST  Result Date: 03/10/2022 CLINICAL DATA:  Trauma EXAM: CT HEAD WITHOUT CONTRAST CT CERVICAL SPINE WITHOUT CONTRAST TECHNIQUE: Multidetector CT imaging of the head and cervical spine was performed following the standard protocol without intravenous contrast. Multiplanar CT image reconstructions of the cervical spine were also generated. RADIATION DOSE REDUCTION: This exam was performed according to the departmental dose-optimization program which includes automated exposure control, adjustment of the mA and/or kV according to patient size and/or use of iterative reconstruction technique. COMPARISON:  CT head and CT cervical spine 03/04/2022 FINDINGS: CT HEAD FINDINGS Brain: There is no acute intracranial hemorrhage, extra-axial fluid collection, or acute infarct. Parenchymal volume is normal. The ventricles are stable in size. Gray-white differentiation is preserved. There is patchy hypodensity in the supratentorial white matter likely  reflecting sequela of chronic white matter microangiopathy, unchanged. There is no mass lesion.  There is no mass effect or midline shift. Vascular: There is  calcification of the bilateral cavernous ICAs. Skull: Normal. Negative for fracture or focal lesion. Sinuses/Orbits: Complete opacification of the right maxillary sinus with surrounding hyperostosis is consistent with chronic sinusitis, unchanged. The globes and orbits are unremarkable. Other: None. CT CERVICAL SPINE FINDINGS Alignment: Normal. There is no antero or retrolisthesis. There is no evidence of traumatic malalignment. Skull base and vertebrae: Skull base alignment is maintained. Vertebral body heights are preserved. There is no evidence of acute fracture. There is no suspicious osseous lesion. Soft tissues and spinal canal: No prevertebral fluid or swelling. No visible canal hematoma. Disc levels: There is minimal degenerative change with no significant spinal canal or neural foraminal stenosis. Upper chest: The imaged lung apices are clear. Other: None. IMPRESSION: 1. No acute intracranial pathology. 2. No acute fracture or traumatic malalignment of the cervical spine. Electronically Signed   By: Valetta Mole M.D.   On: 03/10/2022 13:11   DG Pelvis Portable  Result Date: 03/10/2022 CLINICAL DATA:  Trauma/multiple falls with pain. EXAM: PORTABLE PELVIS 1-2 VIEWS COMPARISON:  CT abdomen pelvis dated 08/08 2023 FINDINGS: There is no evidence of pelvic fracture or diastasis. No pelvic bone lesions are seen. Minimal degenerative changes are seen in both hips. IMPRESSION: No acute osseous injury. Electronically Signed   By: Zerita Boers M.D.   On: 03/10/2022 12:21   DG Chest Port 1 View  Result Date: 03/10/2022 CLINICAL DATA:  Trauma/multiple falls with pain. EXAM: PORTABLE CHEST 1 VIEW COMPARISON:  Chest radiograph dated 01/30/2021. FINDINGS: The heart size and mediastinal contours are within normal limits. Vascular calcifications are seen in  the aortic arch. The lungs are hyperinflated. Both lungs are clear. The visualized skeletal structures are unremarkable. IMPRESSION: No active disease. Aortic Atherosclerosis (ICD10-I70.0) and Emphysema (ICD10-J43.9). Electronically Signed   By: Zerita Boers M.D.   On: 03/10/2022 12:20    Procedures Procedures    Medications Ordered in ED Medications  sodium chloride 0.9 % bolus 1,000 mL (0 mLs Intravenous Stopped 03/10/22 1324)    And  0.9 %  sodium chloride infusion ( Intravenous New Bag/Given 03/10/22 1529)  magnesium sulfate IVPB 2 g 50 mL (2 g Intravenous New Bag/Given 03/10/22 1531)  sodium chloride 0.9 % bolus 1,000 mL (0 mLs Intravenous Stopped 03/10/22 1441)    ED Course/ Medical Decision Making/ A&P                           Medical Decision Making Amount and/or Complexity of Data Reviewed Labs: ordered. Radiology: ordered.  Risk Prescription drug management. Decision regarding hospitalization.   This patient presents to the ED for concern of multiple falls, this involves an extensive number of treatment options, and is a complaint that carries with it a high risk of complications and morbidity.  The differential diagnosis includes syncope, orthostatic hypotension, FTT, infection   Co morbidities that complicate the patient evaluation  DM2, Hypothyroidism, HLD, COPD, CAD, HTN, and hx CVA   Additional history obtained:  Additional history obtained from epic chart review External records from outside source obtained and reviewed including son   Lab Tests:  I Ordered, and personally interpreted labs.  The pertinent results include:  cbc with wbc 11.1, hgb 9.9 and hct 30.9 (chronic); bmp with glucose elevated at 261, bun elevated at 59, and cr 1.56 (similar to 8/18); ua nl  Labs from [place] ED 8/18:  ED Results Results for orders placed or performed during the hospital encounter of 03/04/22  Comprehensive  Metabolic Panel  Result Value Ref Range  Sodium 136  135 - 145 mmol/L  Potassium 4.5 3.5 - 5.0 mmol/L  Chloride 102 98 - 107 mmol/L  CO2 24.9 21.0 - 32.0 mmol/L  Anion Gap 9 3 - 11 mmol/L  BUN 45 (H) 8 - 20 mg/dL  Creatinine 1.56 (H) 0.60 - 1.10 mg/dL  BUN/Creatinine Ratio 29  eGFR CKD-EPI (2021) Female 37 (L) >=60 mL/min/1.53m2  Glucose 68 (L) 70 - 179 mg/dL  Calcium 13.6 (HH) 8.5 - 10.1 mg/dL  Albumin 3.8 3.5 - 5.0 g/dL  Total Protein 7.6 6.0 - 8.0 g/dL  Total Bilirubin 0.3 0.3 - 1.2 mg/dL  AST 13 (L) 15 - 40 U/L  ALT 27 12 - 78 U/L  Alkaline Phosphatase 116 46 - 116 U/L  Magnesium  Result Value Ref Range  Magnesium 1.2 (L) 1.6 - 2.6 mg/dL  Urinalysis with Microscopy with Culture Reflex  Specimen: Clean Catch; Urine  Result Value Ref Range  Color, UA Yellow  Clarity, UA Clear Clear  Specific Gravity, UA 1.025 1.010 - 1.025  pH, UA 5.0 5.0 - 8.0  Leukocyte Esterase, UA Small (A) Negative  Nitrite, UA Negative Negative  Protein, UA >/= 300 mg/dL (A) Negative  Glucose, UA Negative Negative  Ketones, UA Trace (A) Negative  Urobilinogen, UA 0.2 mg/dL 0.1 - 1.0 mg/dL  Bilirubin, UA Negative Negative  Blood, UA Negative Negative  RBC, UA 2 0 - 5 /HPF  WBC, UA 10 (H) <=5 /HPF  Squam Epithel, UA 2 0 - 10 /HPF  Bacteria, UA Few Few, Occasional, Small, None Seen /HPF  Ca Oxalate Crystal, UA Rare /HPF  Phosphorus Level  Result Value Ref Range  Phosphorus 0.5 (LL) 2.4 - 5.1 mg/dL  Result Value Ref Range  WBC 13.2 (H) 4.0 - 10.5 10*9/L  RBC 3.06 (L) 3.80 - 5.10 10*12/L  HGB 9.6 (L) 11.5 - 15.0 g/dL  HCT 29.9 (L) 34.0 - 44.0 %  MCV 97.7 80.0 - 98.0 fL  MCH 31.4 27.0 - 34.0 pg  MCHC 32.1 32.0 - 36.0 g/dL  RDW 13.9 11.5 - 14.5 %  MPV 9.7 7.4 - 10.4 fL  Platelet 351 140 - 415 10*9/L  Neutrophils % 83.2 %  Lymphocytes % 9.6 %  Monocytes % 6.2 %  Eosinophils % 0.2 %  Basophils % 0.2 %  Absolute Neutrophils 11.0 (H) 1.8 - 7.8 10*9/L  Absolute Lymphocytes 1.3 0.7 - 4.5 10*9/L  Absolute Monocytes 0.8 0.1 - 1.0 10*9/L  Absolute  Eosinophils 0.0 0.0 - 0.4 10*9/L  Absolute Basophils 0.0 0.0 - 0.2 10*9/L   Imaging Studies ordered:  I ordered imaging studies including cxr, pelvis x-r, ct head, ct c-spine  I independently visualized and interpreted imaging which showed  CXR: IMPRESSION:  No active disease.    Aortic Atherosclerosis (ICD10-I70.0) and Emphysema (ICD10-J43.9).  Pelvis: IMPRESSION:  No acute osseous injury.   I agree with the radiologist interpretation   Cardiac Monitoring:  The patient was maintained on a cardiac monitor.  I personally viewed and interpreted the cardiac monitored which showed an underlying rhythm of: nsr   Medicines ordered and prescription drug management:  I ordered medication including IVFs  for dehydration  Reevaluation of the patient after these medicines showed that the patient improved I have reviewed the patients home medicines and have made adjustments as needed   Test Considered:  Ct chest/abd/pelvis, but they were done on the 18th    Critical Interventions:  ivfs   Consultations Obtained:  I requested consultation with the [facility] residents,  and discussed lab and imaging findings as well as pertinent plan - they will admit.   Problem List / ED Course:  FTT:  pt given IVFs.  Electrolytes replaced.  Pt d/w medicine for admission.   Reevaluation:  After the interventions noted above, I reevaluated the patient and found that they have :improved   Social Determinants of Health:  Lives alone   Dispostion:  After consideration of the diagnostic results and the patients response to treatment, I feel that the patent would benefit from admission.          Final Clinical Impression(s) / ED Diagnoses Final diagnoses:  Fall, initial encounter  Failure to thrive in adult  Syncope, unspecified syncope type  Hypomagnesemia  Dehydration    Rx / DC Orders ED Discharge Orders     None         Isla Pence, MD 03/10/22 [phone]

## 2022-03-10 NOTE — Hospital Course (Addendum)
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  Frequent falls/malnutrition On initial evaluation, pt reported several months of decreased appetite. Unclear cause. She was evaluated by dietician on 08/25 and placed on Ensure TID, daily multivitamin, and magic cup TID which pt has been compliant with. She has been eating and drinking daily, with improvement in her appetite. She had been feeling consistently better. Complained of some mild abdominal pain on morning of 08/28 which has resolved. Denies any associated nausea or vomiting. Discussed SNF placement and continued monitoring as she rebuilds her strength.   Electrolyte derangement Presented with hypomagnesemia at 1.2, hypophosphatemia at <1.0 and hypercalcemia at 1.56. Magnesium and phosphorous were replenished and normalized during her stay. Unsure as to the etiology of hypercalcemia. PTH was found low at 7 on 08/24. Myeloma labs have been ordered and are in process.  AKI Presented with elevated lactic acid at 2.8, elevated BUN of 59, and elevated creatine of 1.56. Given IV fluids with lactate and creatinine decreasing to normal range. On 08/28, BUN remained elevated at 37. Pt was asked about history of GI bleeds and ulcer which she denied. BUN and Cr slightly elevated AM or 8/30 presumed secondary to diminished PO intake. She was given ivfs and encouraged to take PO.  T2DM Presented with Hx ot T2DM currently on metformin 1000 mg BID, glipizide 5 mg daily and januvia 50 mg daily. Initial glucose was 261. Placed on insulin x3 daily with meals on 08/26. Levels spiked to the 400s on 08/27 likely due to increased PO intake. She was placed on carb modified diet with continuation of insulin. On 08/28 levels came back down to 248. Started on home metformin and long acting with  slightly better control. Metformin and Januvia were restarted upon discharged. Glipizide stopped.   Iron deficient anemia Presented with low RBC at 3.16, HBG of 9.9, and Hct of 30.9. Iron panel and ferritin were low normal. She is on ferrous sulfate at home. Continued 325 mg BID with meals during her stay  Hx of depression Presented with a long history of depression. Was on Lexapro 10 mg daily at home. Continued this throughout her stay       ------------------------ 8/29: Patient states that she is feeling well overall. States that she is eating and drinking well. Denies having issues with going to the bathroom. Denies abdominal pain. States that she is getting up and moving around when she can. Discussed plan to restart metformin today.

## 2022-03-11 DIAGNOSIS — R296 Repeated falls: Secondary | ICD-10-CM

## 2022-03-11 DIAGNOSIS — D509 Iron deficiency anemia, unspecified: Secondary | ICD-10-CM

## 2022-03-11 DIAGNOSIS — R634 Abnormal weight loss: Secondary | ICD-10-CM

## 2022-03-11 DIAGNOSIS — R7402 Elevation of levels of lactic acid dehydrogenase (LDH): Secondary | ICD-10-CM

## 2022-03-11 DIAGNOSIS — Z681 Body mass index (BMI) 19 or less, adult: Secondary | ICD-10-CM

## 2022-03-11 DIAGNOSIS — F32A Depression, unspecified: Secondary | ICD-10-CM

## 2022-03-11 DIAGNOSIS — N179 Acute kidney failure, unspecified: Secondary | ICD-10-CM

## 2022-03-11 DIAGNOSIS — E43 Unspecified severe protein-calorie malnutrition: Secondary | ICD-10-CM | POA: Diagnosis present

## 2022-03-11 LAB — PHOSPHORUS
Phosphorus: <1 mg/dL — CL (ref 2.5–4.6)
Phosphorus: 1.5 mg/dL — ABNORMAL LOW (ref 2.5–4.6)

## 2022-03-11 LAB — GLUCOSE, CAPILLARY
Glucose-Capillary: 124 mg/dL — ABNORMAL HIGH (ref 70–99)
Glucose-Capillary: 135 mg/dL — ABNORMAL HIGH (ref 70–99)
Glucose-Capillary: 188 mg/dL — ABNORMAL HIGH (ref 70–99)
Glucose-Capillary: 243 mg/dL — ABNORMAL HIGH (ref 70–99)
Glucose-Capillary: 245 mg/dL — ABNORMAL HIGH (ref 70–99)
Glucose-Capillary: 95 mg/dL (ref 70–99)

## 2022-03-11 LAB — IRON AND TIBC
Iron: 30 ug/dL (ref 28–170)
Saturation Ratios: 10 % — ABNORMAL LOW (ref 10.4–31.8)
TIBC: 300 ug/dL (ref 250–450)
UIBC: 270 ug/dL

## 2022-03-11 LAB — HEMOGLOBIN A1C
Hgb A1c MFr Bld: 6 % — ABNORMAL HIGH (ref 4.8–5.6)
Mean Plasma Glucose: 125.5 mg/dL

## 2022-03-11 LAB — BASIC METABOLIC PANEL
Anion gap: 5 (ref 5–15)
BUN: 39 mg/dL — ABNORMAL HIGH (ref 8–23)
CO2: 22 mmol/L (ref 22–32)
Calcium: 10.1 mg/dL (ref 8.9–10.3)
Chloride: 113 mmol/L — ABNORMAL HIGH (ref 98–111)
Creatinine, Ser: 1.07 mg/dL — ABNORMAL HIGH (ref 0.44–1.00)
GFR, Estimated: 57 mL/min — ABNORMAL LOW (ref >60)
Glucose, Bld: 103 mg/dL — ABNORMAL HIGH (ref 70–99)
Potassium: 3.9 mmol/L (ref 3.5–5.1)
Sodium: 140 mmol/L (ref 135–145)

## 2022-03-11 LAB — MAGNESIUM: Magnesium: 1.5 mg/dL — ABNORMAL LOW (ref 1.7–2.4)

## 2022-03-11 LAB — LIPID PANEL
Cholesterol: 99 mg/dL (ref 0–200)
HDL: 28 mg/dL — ABNORMAL LOW (ref >40)
LDL Cholesterol: 45 mg/dL (ref 0–99)
Total CHOL/HDL Ratio: 3.5 RATIO
Triglycerides: 129 mg/dL (ref <150)
VLDL: 26 mg/dL (ref 0–40)

## 2022-03-11 LAB — CBC WITH DIFFERENTIAL/PLATELET
Abs Immature Granulocytes: 0.08 10*3/uL — ABNORMAL HIGH (ref 0.00–0.07)
Basophils Absolute: 0 10*3/uL (ref 0.0–0.1)
Basophils Relative: 0 %
Eosinophils Absolute: 0.1 10*3/uL (ref 0.0–0.5)
Eosinophils Relative: 1 %
HCT: 24.3 % — ABNORMAL LOW (ref 36.0–46.0)
Hemoglobin: 8.1 g/dL — ABNORMAL LOW (ref 12.0–15.0)
Immature Granulocytes: 1 %
Lymphocytes Relative: 10 %
Lymphs Abs: 0.9 10*3/uL (ref 0.7–4.0)
MCH: 31.6 pg (ref 26.0–34.0)
MCHC: 33.3 g/dL (ref 30.0–36.0)
MCV: 94.9 fL (ref 80.0–100.0)
Monocytes Absolute: 0.7 10*3/uL (ref 0.1–1.0)
Monocytes Relative: 8 %
Neutro Abs: 7.1 10*3/uL (ref 1.7–7.7)
Neutrophils Relative %: 80 %
Platelets: 349 10*3/uL (ref 150–400)
RBC: 2.56 MIL/uL — ABNORMAL LOW (ref 3.87–5.11)
RDW: 13.5 % (ref 11.5–15.5)
WBC: 8.9 10*3/uL (ref 4.0–10.5)
nRBC: 0 % (ref 0.0–0.2)

## 2022-03-11 LAB — LACTIC ACID, PLASMA
Lactic Acid, Venous: 1.4 mmol/L (ref 0.5–1.9)
Lactic Acid, Venous: 0.8 mmol/L (ref 0.5–1.9)

## 2022-03-11 LAB — FERRITIN: Ferritin: 42 ng/mL (ref 11–307)

## 2022-03-11 MED ORDER — K PHOS MONO-SOD PHOS DI & MONO 155-852-130 MG PO TABS
250.0000 mg | ORAL_TABLET | Freq: Once | ORAL | Status: AC
Start: 2022-03-11 — End: 2022-03-11
  Administered 2022-03-11: 250 mg via ORAL
  Filled 2022-03-11: qty 1

## 2022-03-11 MED ORDER — ENSURE ENLIVE PO LIQD
237.0000 mL | Freq: Three times a day (TID) | ORAL | Status: DC
  Administered 2022-03-11 – 2022-03-16 (×12): 237 mL via ORAL
  Filled 2022-03-11: qty 237

## 2022-03-11 MED ORDER — DEXTROSE 5 % IV SOLN
25.0000 mmol | Freq: Once | INTRAVENOUS | Status: AC
  Administered 2022-03-11: 25 mmol via INTRAVENOUS
  Filled 2022-03-11: qty 8.33

## 2022-03-11 MED ORDER — MAGNESIUM SULFATE 2 GM/50ML IV SOLN
2.0000 g | Freq: Once | INTRAVENOUS | Status: AC
  Administered 2022-03-11: 2 g via INTRAVENOUS
  Filled 2022-03-11: qty 50

## 2022-03-11 NOTE — Plan of Care (Signed)
Bed alarm on, pure wick in placed due to urgency.  Problem: Safety: Goal: Ability to remain free from injury will improve Outcome: Progressing   Problem: Activity: Goal: Risk for activity intolerance will decrease Outcome: Progressing

## 2022-03-11 NOTE — Progress Notes (Signed)
Initial Nutrition Assessment  DOCUMENTATION CODES:   Underweight, Severe malnutrition in context of chronic illness  INTERVENTION:   -Ensure Enlive po TID, each supplement provides 350 kcal and 20 grams of protein -MVI with minerals daily -Magic cup TID with meals, each supplement provides 290 kcal and 9 grams of protein   NUTRITION DIAGNOSIS:   Severe Malnutrition related to chronic illness (CVA) as evidenced by moderate fat depletion, severe fat depletion, moderate muscle depletion, severe muscle depletion.  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  Pt admitted with hyperlactatemia and frequent falls.   Reviewed I/O's: +2.4 L x 24 hours  UOP: 400 ml x 24 hours   Pt sleepy at time of visit. She greeted this RD, but did not answer further questions, as pt kept falling asleep during interview. No supports at bedside.   Observed pt breakfast tray, to which pt only consumed bites. Suspect oral intake has been poor PTA.   Reviewed wt hx; pt has experienced a 8% wt loss over the past 16 months, which is not significant for time frame.   Medications reviewed and include ferrous sulfate.  Lab Results  Component Value Date   HGBA1C 6.0 (H) 03/10/2022   PTA DM medications are 1000 mg metformin BID, 5 mg glipizide daily, and 50 mg [place] daily.   Labs reviewed: CBGS: 95-188 (inpatient orders for glycemic control are 0-6 units insulin aspart every 4 hours).    NUTRITION - FOCUSED PHYSICAL EXAM:  Flowsheet Row Most Recent Value  Orbital Region Moderate depletion  Upper Arm Region Severe depletion  Thoracic and Lumbar Region Moderate depletion  Buccal  Region Severe depletion  Temple Region Severe depletion  Clavicle Bone Region Severe depletion  Clavicle and Acromion Bone Region Severe depletion  Scapular Bone Region Severe depletion  Dorsal Hand Moderate depletion  Patellar Region Severe depletion  Anterior Thigh Region Severe depletion  Posterior Calf Region Severe depletion  Edema (RD Assessment) None  Hair Reviewed  Eyes Reviewed  Mouth Reviewed  Skin Reviewed  Nails Reviewed       Diet Order:   Diet Order             Diet regular Room service appropriate? Yes; Fluid consistency: Thin  Diet effective now                   EDUCATION NEEDS:   No education needs have been identified at this time  Skin:  Skin Assessment: Reviewed RN Assessment  Last BM:  03/09/22  Height:   Ht Readings from Last 1 Encounters:  03/10/22 '5\' 2"'$  (1.575 m)    Weight:   Wt Readings from Last 1 Encounters:  03/10/22 38.2 kg    Ideal Body Weight:  50 kg  BMI:  Body mass index is 15.4 kg/m.  Estimated Nutritional Needs:   Kcal:  1550-1750  Protein:  85-100 grams  Fluid:  > 1.5 L    Loistine Chance, RD, LDN, [place] Registered Dietitian II Certified Diabetes Care and Education Specialist Please refer to [facility] for RD and/or RD on-call/weekend/after hours pager

## 2022-03-11 NOTE — Progress Notes (Signed)
HD#1 Subjective:   Summary: Ashlee Austin is a 66 year old female with PMH of T2DM, HTN, CVA without residual deficits, HDL and depression presents for multiple falls, decreased appetite and weight loss and admitted for electrolyte derangements and multiple falls.   Overnight Events: none  [name] reports feeling weak and fatigued. She does not have a good appetite. Notes she just does not want to eat. Has been going on for several months. Her son lives nearby.   Objective:  Vital signs in last 24 hours: Vitals:   03/11/22 0010 03/11/22 0012 03/11/22 0114 03/11/22 0659  BP: 139/88 (!) 134/90 (!) 143/83 (!) 150/84  Pulse: 99 (!) 108 94 92  Resp:   20 18  Temp:   98.4 F (36.9 C) 98.1 F (36.7 C)  TempSrc:   Oral Oral  SpO2: 99% 99% 98% 100%  Weight:      Height:       Supplemental O2: Room Air SpO2: 100 %   Physical Exam:  Constitutional: thin and frail appearing, in no acute distress HENT: normocephalic atraumatic Neck: supple Cardiovascular: regular rate and rhythm Pulmonary/Chest: normal work of breathing on room air Neurological: alert & oriented x 3 Skin: warm and dry Psych: normal mood and behavior   Filed Weights   03/10/22 1153 03/10/22 2000  Weight: 34 kg 38.2 kg     Intake/Output Summary (Last 24 hours) at 03/11/2022 0812 Last data filed at 03/11/2022 0700 Gross per 24 hour  Intake 2803.52 ml  Output 400 ml  Net 2403.52 ml   Net IO Since Admission: 2,403.52 mL [03/11/22 0812]  Pertinent Labs:    Latest Ref Rng & Units 03/11/2022   12:47 AM 03/10/2022   12:15 PM 03/10/2022   12:08 PM  CBC  WBC 4.0 - 10.5 K/uL 8.9   11.1   Hemoglobin 12.0 - 15.0 g/dL 8.1  10.2  9.9   Hematocrit 36.0 - 46.0 % 24.3  30.0  30.9   Platelets 150 - 400 K/uL 349   394        Latest Ref Rng & Units 03/11/2022   12:47 AM 03/10/2022   12:15 PM 03/10/2022   12:08 PM  CMP  Glucose 70 - 99 mg/dL 103  265  261   BUN 8 - 23 mg/dL 39  58  59   Creatinine 0.44 - 1.00  mg/dL 1.07  1.50  1.56   Sodium 135 - 145 mmol/L 140  137  136   Potassium 3.5 - 5.1 mmol/L 3.9  4.7  4.7   Chloride 98 - 111 mmol/L 113  108  107   CO2 22 - 32 mmol/L 22   20   Calcium 8.9 - 10.3 mg/dL 10.1   11.9   Total Protein 6.5 - 8.1 g/dL   7.0   Total Bilirubin 0.3 - 1.2 mg/dL   0.4   Alkaline Phos 38 - 126 U/L   102   AST 15 - 41 U/L   20   ALT 0 - 44 U/L   24     Imaging: CT HEAD WO CONTRAST  Result Date: 03/10/2022 CLINICAL DATA:  Trauma EXAM: CT HEAD WITHOUT CONTRAST CT CERVICAL SPINE WITHOUT CONTRAST TECHNIQUE: Multidetector CT imaging of the head and cervical spine was performed following the standard protocol without intravenous contrast. Multiplanar CT image reconstructions of the cervical spine were also generated. RADIATION DOSE REDUCTION: This exam was performed according to the departmental dose-optimization program which includes automated exposure control,  adjustment of the mA and/or kV according to patient size and/or use of iterative reconstruction technique. COMPARISON:  CT head and CT cervical spine 03/04/2022 FINDINGS: CT HEAD FINDINGS Brain: There is no acute intracranial hemorrhage, extra-axial fluid collection, or acute infarct. Parenchymal volume is normal. The ventricles are stable in size. Gray-white differentiation is preserved. There is patchy hypodensity in the supratentorial white matter likely reflecting sequela of chronic white matter microangiopathy, unchanged. There is no mass lesion.  There is no mass effect or midline shift. Vascular: There is calcification of the bilateral cavernous ICAs. Skull: Normal. Negative for fracture or focal lesion. Sinuses/Orbits: Complete opacification of the right maxillary sinus with surrounding hyperostosis is consistent with chronic sinusitis, unchanged. The globes and orbits are unremarkable. Other: None. CT CERVICAL SPINE FINDINGS Alignment: Normal. There is no antero or retrolisthesis. There is no evidence of traumatic  malalignment. Skull base and vertebrae: Skull base alignment is maintained. Vertebral body heights are preserved. There is no evidence of acute fracture. There is no suspicious osseous lesion. Soft tissues and spinal canal: No prevertebral fluid or swelling. No visible canal hematoma. Disc levels: There is minimal degenerative change with no significant spinal canal or neural foraminal stenosis. Upper chest: The imaged lung apices are clear. Other: None. IMPRESSION: 1. No acute intracranial pathology. 2. No acute fracture or traumatic malalignment of the cervical spine. Electronically Signed   By: Valetta Mole M.D.   On: 03/10/2022 13:11   CT CERVICAL SPINE WO CONTRAST  Result Date: 03/10/2022 CLINICAL DATA:  Trauma EXAM: CT HEAD WITHOUT CONTRAST CT CERVICAL SPINE WITHOUT CONTRAST TECHNIQUE: Multidetector CT imaging of the head and cervical spine was performed following the standard protocol without intravenous contrast. Multiplanar CT image reconstructions of the cervical spine were also generated. RADIATION DOSE REDUCTION: This exam was performed according to the departmental dose-optimization program which includes automated exposure control, adjustment of the mA and/or kV according to patient size and/or use of iterative reconstruction technique. COMPARISON:  CT head and CT cervical spine 03/04/2022 FINDINGS: CT HEAD FINDINGS Brain: There is no acute intracranial hemorrhage, extra-axial fluid collection, or acute infarct. Parenchymal volume is normal. The ventricles are stable in size. Gray-white differentiation is preserved. There is patchy hypodensity in the supratentorial white matter likely reflecting sequela of chronic white matter microangiopathy, unchanged. There is no mass lesion.  There is no mass effect or midline shift. Vascular: There is calcification of the bilateral cavernous ICAs. Skull: Normal. Negative for fracture or focal lesion. Sinuses/Orbits: Complete opacification of the right  maxillary sinus with surrounding hyperostosis is consistent with chronic sinusitis, unchanged. The globes and orbits are unremarkable. Other: None. CT CERVICAL SPINE FINDINGS Alignment: Normal. There is no antero or retrolisthesis. There is no evidence of traumatic malalignment. Skull base and vertebrae: Skull base alignment is maintained. Vertebral body heights are preserved. There is no evidence of acute fracture. There is no suspicious osseous lesion. Soft tissues and spinal canal: No prevertebral fluid or swelling. No visible canal hematoma. Disc levels: There is minimal degenerative change with no significant spinal canal or neural foraminal stenosis. Upper chest: The imaged lung apices are clear. Other: None. IMPRESSION: 1. No acute intracranial pathology. 2. No acute fracture or traumatic malalignment of the cervical spine. Electronically Signed   By: Valetta Mole M.D.   On: 03/10/2022 13:11   DG Pelvis Portable  Result Date: 03/10/2022 CLINICAL DATA:  Trauma/multiple falls with pain. EXAM: PORTABLE PELVIS 1-2 VIEWS COMPARISON:  CT abdomen pelvis dated 08/08 2023 FINDINGS: There  is no evidence of pelvic fracture or diastasis. No pelvic bone lesions are seen. Minimal degenerative changes are seen in both hips. IMPRESSION: No acute osseous injury. Electronically Signed   By: Zerita Boers M.D.   On: 03/10/2022 12:21   DG Chest Port 1 View  Result Date: 03/10/2022 CLINICAL DATA:  Trauma/multiple falls with pain. EXAM: PORTABLE CHEST 1 VIEW COMPARISON:  Chest radiograph dated 01/30/2021. FINDINGS: The heart size and mediastinal contours are within normal limits. Vascular calcifications are seen in the aortic arch. The lungs are hyperinflated. Both lungs are clear. The visualized skeletal structures are unremarkable. IMPRESSION: No active disease. Aortic Atherosclerosis (ICD10-I70.0) and Emphysema (ICD10-J43.9). Electronically Signed   By: Zerita Boers M.D.   On: 03/10/2022 12:20    Assessment/Plan:    Principal Problem:   Falls frequently   Patient Summary: Ashlee Austin is a 66 y.o. with PMH of T2DM, HTN, CVA without residual deficits, HDL and depression presents for multiple falls, decreased appetite and weight loss and admitted for electrolyte derangements and multiple falls.    Frequent Falls BMI <19 Severe malnutrition  Multiple falls likely secondary to weakness from severe malnutrition and poor po intake. Will replete electrolytes. Do not believe this to be vasovagal or cardiogenic syncope related. RD recommends starting supplements, MVI and magic cups. PT and OT recommend SNF.  -Ensure supplements, MVI, magic cups -TOC consult for SNF placement  AKI Hyperlactaemia Lactic acid down to 0.8. AKI improving with IV fluids. Likely from dehydration. Will continue to monitor.   Hypercalcemia Hypomagnesemia Hypophosphatemia Magnesium 1.5 so gave 2 g today. Phosphorus 1.5 so continue with repletion. Pending PTH. -sodium phosphate 57mol over 6 hours today  -repeat BMP, magnesium and phosphorus levels, replete if necessary  Iron deficiency anemia Hgb 8.1. Iron panel and ferritin low normal. She is on ferrous sulfate at home. -ferrous sulfate 325 mg BID with meals  Hx of depression Per son, patient has history of admission and hospitalization for suicide several years ago. He is concerned her depression is affecting her appetite. On lexapro at home. -lexapro 10 mg daily  Diet: Normal IVF: None,None VTE: SCDs Code: DNR PT/OT recs: SNF for Subacute PT Family Update: son at work today, plan to update him    Dispo: Anticipated discharge to Skilled nursing facility in 1-2 days pending acute conditions.   MAngelique Blonder DO Internal Medicine Resident PGY-1 Please contact the on call pager after 5 pm and on weekends at [phone]

## 2022-03-11 NOTE — Evaluation (Signed)
Physical Therapy Evaluation Patient Details Name: Ashlee Austin MRN: [id number] DOB: 12-26-1955 Today's Date: 03/11/2022  History of Present Illness  66 y/o female presented to ED on 03/10/22 following 5 falls in 48 hours. Recently seen at [facility] ED for similar concerns and found to have hypomagnesemia and suspected UTI. Admitted for AKI and frequent falls. PMH: T2DM, HTN, CVAx2, depression, COPD, CAD  Clinical Impression  Patient admitted with the above. PTA, patient lives alone and was independent with use of RW, however reporting numerous falls (at least 15 falls in past month) and difficulty performing ADLs. Patient presents with weakness, impaired balance, and decreased activity tolerance. Patient quickly fatigued with short mobility tasks with use of RW. Requiring min guard-minA during ambulation but no overt LOB noted. Patient stating she feels uncomfortable returning home alone due to recent falls. Patient will benefit from skilled PT services during acute stay to address listed deficits. Recommend SNF at discharge to maximize functional independence and safety to reduce fall risk.      Recommendations for follow up therapy are one component of a multi-disciplinary discharge planning process, led by the attending physician.  Recommendations may be updated based on patient status, additional functional criteria and insurance authorization.  Follow Up Recommendations Skilled nursing-short term rehab (<3 hours/day) Can patient physically be transported by private vehicle: Yes    Assistance Recommended at Discharge Frequent or constant Supervision/Assistance  Patient can return home with the following  A little help with walking and/or transfers;A little help with bathing/dressing/bathroom;Assistance with cooking/housework;Assist for transportation;Help with stairs or ramp for entrance    Equipment Recommendations Other (comment) (defer to post acute rehab)  Recommendations for Other  Services       Functional Status Assessment Patient has had a recent decline in their functional status and demonstrates the ability to make significant improvements in function in a reasonable and predictable amount of time.     Precautions / Restrictions Precautions Precautions: Fall Precaution Comments: frequent falls Restrictions Weight Bearing Restrictions: No      Mobility  Bed Mobility Overal bed mobility: Needs Assistance Bed Mobility: Supine to Sit     Supine to sit: Supervision     General bed mobility comments: increased time to come into sitting and reposition hips towards EOB    Transfers Overall transfer level: Needs assistance Equipment used: Rolling Desmon Hitchner (2 wheels) Transfers: Sit to/from Stand Sit to Stand: Min guard           General transfer comment: min guard for safety. Cues for hand placement.    Ambulation/Gait Ambulation/Gait assistance: Min guard, Min assist Gait Distance (Feet): 10 Feet (+10') Assistive device: Rolling Damen Windsor (2 wheels) Gait Pattern/deviations: Step-through pattern, Decreased stride length, Narrow base of support Gait velocity: decreased     General Gait Details: minA during turns for balance but otherwise min guard for safety due to hx of frequent falls. Easily fatigues  Stairs            Wheelchair Mobility    Modified Rankin (Stroke Patients Only)       Balance Overall balance assessment: Needs assistance, History of Falls Sitting-balance support: No upper extremity supported, Feet supported Sitting balance-Leahy Scale: Fair Sitting balance - Comments: posterior lean during MMT of LEs but no overt LOB posteriorly   Standing balance support: Bilateral upper extremity supported, Reliant on assistive device for balance Standing balance-Leahy Scale: Poor Standing balance comment: reliant on UE support  Pertinent Vitals/Pain Pain Assessment Pain Assessment:  No/denies pain    Home Living Family/patient expects to be discharged to:: Private residence Living Arrangements: Alone Available Help at Discharge: Family;Available PRN/intermittently Type of Home: House Home Access: Stairs to enter Entrance Stairs-Rails: None Entrance Stairs-Number of Steps: 1   Home Layout: One level Home Equipment: Conservation officer, nature (2 wheels)      Prior Function Prior Level of Function : History of Falls (last six months);Independent/Modified Independent;Driving             Mobility Comments: uses RW; reports at least 15 falls in past month. Has had life alert for 1 week ADLs Comments: reports difficulty bathing and has recently been sponge bathing     Hand Dominance        Extremity/Trunk Assessment   Upper Extremity Assessment Upper Extremity Assessment: Defer to OT evaluation    Lower Extremity Assessment Lower Extremity Assessment: Generalized weakness (grossly 2-/5 bilaterally)    Cervical / Trunk Assessment Cervical / Trunk Assessment: Kyphotic  Communication   Communication: No difficulties  Cognition Arousal/Alertness: Awake/alert Behavior During Therapy: WFL for tasks assessed/performed Overall Cognitive Status: No family/caregiver present to determine baseline cognitive functioning                                 General Comments: seems WFL with basic mobility tasks        General Comments      Exercises     Assessment/Plan    PT Assessment Patient needs continued PT services  PT Problem List Decreased strength;Decreased activity tolerance;Decreased balance;Decreased mobility;Decreased knowledge of precautions       PT Treatment Interventions Gait training;DME instruction;Functional mobility training;Therapeutic activities;Therapeutic exercise;Balance training;Patient/family education    PT Goals (Current goals can be found in the Care Plan section)  Acute Rehab PT Goals Patient Stated Goal: to feel more  safe at home PT Goal Formulation: With patient Time For Goal Achievement: 03/25/22 Potential to Achieve Goals: Fair    Frequency Min 2X/week     Co-evaluation               AM-PAC PT "6 Clicks" Mobility  Outcome Measure Help needed turning from your back to your side while in a flat bed without using bedrails?: A Little Help needed moving from lying on your back to sitting on the side of a flat bed without using bedrails?: A Little Help needed moving to and from a bed to a chair (including a wheelchair)?: A Little Help needed standing up from a chair using your arms (e.g., wheelchair or bedside chair)?: A Little Help needed to walk in hospital room?: A Little Help needed climbing 3-5 steps with a railing? : Total 6 Click Score: 16    End of Session Equipment Utilized During Treatment: Gait belt Activity Tolerance: Patient tolerated treatment well Patient left: in chair;with call bell/phone within reach;with chair alarm set Nurse Communication: Mobility status PT Visit Diagnosis: Unsteadiness on feet (R26.81);Repeated falls (R29.6);Muscle weakness (generalized) (M62.81);History of falling (Z91.81);Difficulty in walking, not elsewhere classified (R26.2);Adult, failure to thrive (R62.7)    Time: 5364-6803 PT Time Calculation (min) (ACUTE ONLY): 21 min   Charges:   PT Evaluation $PT Eval Moderate Complexity: 1 Mod          Bethzaida Boord A. Gilford Rile PT, DPT Acute Rehabilitation Services Office [phone]   Linna Hoff 03/11/2022, 10:10 AM

## 2022-03-11 NOTE — Evaluation (Signed)
Occupational Therapy Evaluation Patient Details Name: Ashlee Austin MRN: [id number] DOB: 12-16-1955 Today's Date: 03/11/2022   History of Present Illness 66 y/o female presented to ED on 03/10/22 following 5 falls in 48 hours. Recently seen at [facility] ED for similar concerns and found to have hypomagnesemia and suspected UTI. Admitted for AKI and frequent falls. PMH: T2DM, HTN, CVAx2, depression, COPD, CAD   Clinical Impression   Prior to this admission, patient was living alone, and independent with use of RW, patient no longer drove. Patient reports over 15 falls within the last month, and over 5 falls in the last few days. Patient denies dizziness with ambulation and no nystagmus or vision changes noted. Patient currently presenting with decreased activity tolerance, poor balance, and need for increased assist for basic ADLs. Patient mod A for ADLs and min A for ambulation, fatiguing quickly and requiring rest breaks. OT recommending SNF level rehab at discharge in order to increase overall activity tolerance and independence prior to return home. OT will continue to follow acutely.      Recommendations for follow up therapy are one component of a multi-disciplinary discharge planning process, led by the attending physician.  Recommendations may be updated based on patient status, additional functional criteria and insurance authorization.   Follow Up Recommendations  Skilled nursing-short term rehab (<3 hours/day)    Assistance Recommended at Discharge Frequent or constant Supervision/Assistance  Patient can return home with the following A little help with walking and/or transfers;A lot of help with bathing/dressing/bathroom;Assistance with cooking/housework;Assist for transportation;Help with stairs or ramp for entrance    Functional Status Assessment  Patient has had a recent decline in their functional status and demonstrates the ability to make significant improvements in function  in a reasonable and predictable amount of time.  Equipment Recommendations  Other (comment) (Defer to next venue of care)    Recommendations for Other Services       Precautions / Restrictions Precautions Precautions: Fall Precaution Comments: frequent falls Restrictions Weight Bearing Restrictions: No      Mobility Bed Mobility Overal bed mobility: Needs Assistance Bed Mobility: Supine to Sit     Supine to sit: Supervision     General bed mobility comments: increased time to come into sitting and reposition hips towards EOB    Transfers Overall transfer level: Needs assistance Equipment used: Rolling walker (2 wheels) Transfers: Sit to/from Stand Sit to Stand: Min guard           General transfer comment: min guard for safety. Cues for hand placement.      Balance Overall balance assessment: Needs assistance, History of Falls Sitting-balance support: No upper extremity supported, Feet supported Sitting balance-Leahy Scale: Fair Sitting balance - Comments: posterior lean during MMT of LEs but no overt LOB posteriorly   Standing balance support: Bilateral upper extremity supported, Reliant on assistive device for balance Standing balance-Leahy Scale: Poor Standing balance comment: reliant on UE support                           ADL either performed or assessed with clinical judgement   ADL Overall ADL's : Needs assistance/impaired Eating/Feeding: Set up;Sitting   Grooming: Wash/dry hands;Wash/dry face;Oral care;Set up;Sitting;Standing Grooming Details (indicate cue type and reason): had to sit due to fatigue after 90 seconds Upper Body Bathing: Minimal assistance;Sitting   Lower Body Bathing: Moderate assistance;Sitting/lateral leans;Sit to/from stand   Upper Body Dressing : Minimal assistance;Sitting   Lower Body Dressing:  Moderate assistance;Sitting/lateral leans;Sit to/from stand   Toilet Transfer: Minimal assistance;Ambulation;Regular  Toilet;Rolling walker (2 wheels)   Toileting- Clothing Manipulation and Hygiene: Supervision/safety;Sitting/lateral lean       Functional mobility during ADLs: Minimal assistance;Cueing for sequencing;Cueing for safety;Rolling walker (2 wheels) General ADL Comments: Patient presenting with decreased activity tolerance, poor balance, and need for increased assist for basic ADLs     Vision Baseline Vision/History: 1 Wears glasses Ability to See in Adequate Light: 0 Adequate Patient Visual Report: No change from baseline       Perception     Praxis      Pertinent Vitals/Pain Pain Assessment Pain Assessment: No/denies pain     Hand Dominance     Extremity/Trunk Assessment Upper Extremity Assessment Upper Extremity Assessment: Generalized weakness   Lower Extremity Assessment Lower Extremity Assessment: Defer to PT evaluation   Cervical / Trunk Assessment Cervical / Trunk Assessment: Kyphotic   Communication Communication Communication: No difficulties   Cognition Arousal/Alertness: Awake/alert Behavior During Therapy: WFL for tasks assessed/performed Overall Cognitive Status: No family/caregiver present to determine baseline cognitive functioning                                 General Comments: seems WFL with basic mobility tasks     General Comments  VSS on RA    Exercises     Shoulder Instructions      Home Living Family/patient expects to be discharged to:: Private residence Living Arrangements: Alone Available Help at Discharge: Family;Available PRN/intermittently Type of Home: House Home Access: Stairs to enter [employer] of Steps: 1 Entrance Stairs-Rails: None Home Layout: One level     Bathroom Shower/Tub: Teacher, early years/pre: Standard     Home Equipment: Conservation officer, nature (2 wheels)          Prior Functioning/Environment Prior Level of Function : History of Falls (last six  months);Independent/Modified Independent;Driving             Mobility Comments: uses RW; reports at least 15 falls in past month. Has had life alert for 1 week ADLs Comments: reports difficulty bathing and has recently been sponge bathing        OT Problem List: Decreased strength;Decreased activity tolerance;Impaired balance (sitting and/or standing);Decreased coordination;Decreased safety awareness;Decreased knowledge of use of DME or AE;Decreased knowledge of precautions      OT Treatment/Interventions: Self-care/ADL training;Therapeutic exercise;Energy conservation;DME and/or AE instruction;Therapeutic activities;Visual/perceptual remediation/compensation;Patient/family education;Balance training;Manual therapy    OT Goals(Current goals can be found in the care plan section) Acute Rehab OT Goals Patient Stated Goal: to get stronger OT Goal Formulation: With patient Time For Goal Achievement: 03/25/22 Potential to Achieve Goals: Good ADL Goals Pt Will Perform Lower Body Bathing: Independently;sit to/from stand;sitting/lateral leans Pt Will Perform Lower Body Dressing: Independently;sitting/lateral leans;sit to/from stand Pt Will Transfer to Toilet: Independently;ambulating Pt Will Perform Toileting - Clothing Manipulation and hygiene: Independently;sit to/from stand;sitting/lateral leans Pt/caregiver will Perform Home Exercise Program: Increased ROM;Increased strength;Both right and left upper extremity;With theraband;With minimal assist;With written HEP provided Additional ADL Goal #1: Patient will demonstrate increased activity tolerance in order to complete functional task in standing for 3-5 minutes without needing a seated rest break.  OT Frequency: Min 2X/week    Co-evaluation              AM-PAC OT "6 Clicks" Daily Activity     Outcome Measure Help from another person eating meals?: A Little Help  from another person taking care of personal grooming?: A Little Help  from another person toileting, which includes using toliet, bedpan, or urinal?: A Little Help from another person bathing (including washing, rinsing, drying)?: A Lot Help from another person to put on and taking off regular upper body clothing?: A Little Help from another person to put on and taking off regular lower body clothing?: A Lot 6 Click Score: 16   End of Session Equipment Utilized During Treatment: Gait belt;Rolling walker (2 wheels) Nurse Communication: Mobility status  Activity Tolerance: Patient tolerated treatment well Patient left: in chair;with call bell/phone within reach;with chair alarm set  OT Visit Diagnosis: Unsteadiness on feet (R26.81);Other abnormalities of gait and mobility (R26.89);Repeated falls (R29.6);Muscle weakness (generalized) (M62.81);History of falling (Z91.81)                Time: 9371-6967 OT Time Calculation (min): 21 min Charges:  OT General Charges $OT Visit: 1 Visit OT Evaluation $OT Eval Moderate Complexity: 1 Mod  Corinne Ports E. Eduin Friedel, OTR/L Acute Rehabilitation Services [phone]   Ascencion Dike 03/11/2022, 10:33 AM

## 2022-03-12 LAB — BASIC METABOLIC PANEL
Anion gap: 8 (ref 5–15)
BUN: 32 mg/dL — ABNORMAL HIGH (ref 8–23)
CO2: 21 mmol/L — ABNORMAL LOW (ref 22–32)
Calcium: 9.3 mg/dL (ref 8.9–10.3)
Chloride: 110 mmol/L (ref 98–111)
Creatinine, Ser: 1.08 mg/dL — ABNORMAL HIGH (ref 0.44–1.00)
GFR, Estimated: 57 mL/min — ABNORMAL LOW (ref >60)
Glucose, Bld: 169 mg/dL — ABNORMAL HIGH (ref 70–99)
Potassium: 3.3 mmol/L — ABNORMAL LOW (ref 3.5–5.1)
Sodium: 139 mmol/L (ref 135–145)

## 2022-03-12 LAB — GLUCOSE, CAPILLARY
Glucose-Capillary: 133 mg/dL — ABNORMAL HIGH (ref 70–99)
Glucose-Capillary: 157 mg/dL — ABNORMAL HIGH (ref 70–99)
Glucose-Capillary: 162 mg/dL — ABNORMAL HIGH (ref 70–99)
Glucose-Capillary: 207 mg/dL — ABNORMAL HIGH (ref 70–99)
Glucose-Capillary: 245 mg/dL — ABNORMAL HIGH (ref 70–99)
Glucose-Capillary: 360 mg/dL — ABNORMAL HIGH (ref 70–99)

## 2022-03-12 LAB — SEDIMENTATION RATE: Sed Rate: 50 mm/hr — ABNORMAL HIGH (ref 0–22)

## 2022-03-12 LAB — MAGNESIUM: Magnesium: 1.4 mg/dL — ABNORMAL LOW (ref 1.7–2.4)

## 2022-03-12 LAB — C-REACTIVE PROTEIN: CRP: 0.7 mg/dL (ref <1.0)

## 2022-03-12 LAB — PARATHYROID HORMONE, INTACT (NO CA): PTH: 7 pg/mL — ABNORMAL LOW (ref 15–65)

## 2022-03-12 LAB — PHOSPHORUS: Phosphorus: 3.7 mg/dL (ref 2.5–4.6)

## 2022-03-12 MED ORDER — INSULIN ASPART 100 UNIT/ML IJ SOLN
0.0000 [IU] | Freq: Three times a day (TID) | INTRAMUSCULAR | Status: DC
  Administered 2022-03-12: 5 [IU] via SUBCUTANEOUS

## 2022-03-12 MED ORDER — INSULIN ASPART 100 UNIT/ML IJ SOLN
0.0000 [IU] | Freq: Three times a day (TID) | INTRAMUSCULAR | Status: DC
  Administered 2022-03-12: 5 [IU] via SUBCUTANEOUS
  Administered 2022-03-13 (×2): 3 [IU] via SUBCUTANEOUS
  Administered 2022-03-14: 8 [IU] via SUBCUTANEOUS
  Administered 2022-03-14: 11 [IU] via SUBCUTANEOUS
  Administered 2022-03-14 – 2022-03-15 (×2): 3 [IU] via SUBCUTANEOUS

## 2022-03-12 MED ORDER — POTASSIUM CHLORIDE CRYS ER 20 MEQ PO TBCR
40.0000 meq | EXTENDED_RELEASE_TABLET | Freq: Once | ORAL | Status: AC
  Administered 2022-03-12: 40 meq via ORAL
  Filled 2022-03-12: qty 2

## 2022-03-12 MED ORDER — MAGNESIUM SULFATE 2 GM/50ML IV SOLN
2.0000 g | Freq: Once | INTRAVENOUS | Status: AC
Start: 2022-03-12 — End: 2022-03-12
  Administered 2022-03-12: 2 g via INTRAVENOUS
  Filled 2022-03-12: qty 50

## 2022-03-12 NOTE — TOC Initial Note (Signed)
Transition of Care [facility]) - Initial/Assessment Note    Patient Details  Name: Ashlee Austin MRN: [id number] Date of Birth: 18-Feb-1956  Transition of [facility]) CM/SW Contact:    Loreta Ave, [place] Phone Number: 03/12/2022, 9:06 AM  Clinical Narrative:                 CSW received consult for possible SNF placement at time of discharge. CSW spoke with patient's son Merry Proud. Merry Proud reported that patient currently lives aline and given patient's current physical needs and fall risk agrees to SNF placement at time of discharge. Merry Proud reports [facility] and has already spoken with Threasa Beards there as well as the insurance company. CSW discussed insurance authorization process and will provide Medicare SNF ratings list. CSW will send out referrals for review and provide bed offers as available. Pt has received Covid vaccines.   Skilled Nursing Rehab Facilities-   [email or web address]   Ratings out of 5 stars (the highest)   Name Address  Phone # [place] Inspection Overall  [facility] [address] [place] '5 5 2 4  '$ [facility]  [address], [place] [phone] '4 2 5 5  '$ [facility] [place], [place] '1 1 1 1  '$ [place], [place] '2 2 4 4  '$ [facility] [address], [place] '1 1 2 1  '$ [place]. [place] '3 2 4 4  '$ [facility] [address] [place] '5 1 3 3  '$ [facility] [address], [place] '5 2 3 4  '$ [address] ([place]) [place], [place] [phone] '4 1 2 1  '$ [facility] [address], [place] [phone] '4 1 1 1  '$ [facility] [address], [facility] [phone] '3 1 2 1  '$ [facility] ([place]) [place]. [place], [place] [phone] '4 1 1 1  '$ [facility]in Flock [address] [place] [phone] '4 2 4 4          '$ [place] '4 1 3 2  '$ [facility] [place] [address], [place] '3 1 5 4  '$ [facility], [place] [address], [place] [phone] '1 1 1 1  '$ [facility] [address], [place] [phone] '2 2 3 3          '$ [address] (no [facility]) [address], [place] [phone] '5 5 5 5  '$ [facility] (No Humana) [address] [phone] [address], [place] '4 1 4 3  '$ [facility] (No UHC) [place], [place] [phone] '5 5 5 5  '$ [facility] [address], [address] [phone] '2 3 5 5  '$ [place] [address] [place] '1 1 2 1  '$ [facility] [address], [address] [place] '3 1 1 1  '$ [facility] [place], [place] '5 2 4 5  '$ [facility]  [address] [place] '2 2 1 1  '$ [facility] [address], [place] '3 2 1 1  '$ [facility] [place], [place] '2 2 2 2          '$ [facility] [address] [place] '1 1 1 1  '$ [place] [address], [place]  [phone] 2  $'4 2 2  'V$ [facility] [place] [address], [place] Alert [phone] '4 2 3 3  '$ [place] [address] [place] '2 1 1 1  '$ [place] (No Humana) [address], [place] '2 2 3 3  '$ [facility] [address] [place] Alert [phone] '2 1 1 1          '$ [facility] [place], [place] '5 4 5 5  '$ [facility])  [address], [place] '2 1 2 1  '$ [facility]) [place] [address], [place] [phone] '3 1 4 3  '$ [facility] [address], [place] '3 3 4 4  '$ [address] [place], [place] '2 3 1 1   '$ [facility]) [place], [place] '2 1 4 3           '$ Patient Goals and CMS Choice        Expected Discharge Plan and Services                                                Prior Living Arrangements/Services                       Activities of Daily Living Home Assistive Devices/Equipment: Gilford Rile (specify type) ADL Screening (condition at time of admission) Patient's cognitive ability adequate to safely complete daily activities?: Yes Is the patient deaf or have difficulty hearing?: No Does the patient have difficulty seeing, even when wearing glasses/contacts?: No Does the patient have difficulty concentrating, remembering, or making decisions?: No Patient able to express need for assistance with ADLs?: Yes Does the patient have difficulty dressing or bathing?: Yes Independently performs ADLs?: No Communication: Independent Dressing (OT): Independent Grooming: Needs assistance Is this a change from baseline?: Pre-admission baseline Feeding: Independent Bathing: Needs assistance Is this a change from baseline?: Pre-admission baseline Toileting: Independent In/Out Bed: Independent Walks in Home: Independent with device (comment) Does the patient have difficulty walking or climbing stairs?: No Weakness of Legs: Both Weakness of Arms/Hands: None  Permission Sought/Granted                  Emotional Assessment              Admission diagnosis:  Dehydration [E86.0] Hypomagnesemia [E83.42] Falls frequently [R29.6] Failure to thrive in adult [R62.7] Fall, initial encounter [W19.XXXA] Syncope, unspecified syncope type [R55] Patient Active Problem List   Diagnosis Date Noted   Hypophosphatemia 03/11/2022   BMI less than 19,adult 03/11/2022   Protein-calorie malnutrition, severe ([place]) 03/11/2022   Hypomagnesemia 03/11/2022   Hypercalcemia 03/11/2022   Falls frequently 03/10/2022    Protrusion of lumbar intervertebral disc 07/06/2018   Chronic bilateral low back pain with left-sided sciatica 06/27/2018   PCP:  Rosalee Kaufman, PA-C Pharmacy:   [facility] [place], [place] - [place] AT [place] [address] [place] [address] Phone: [phone] Fax: [phone]     Social Determinants of Health (SDOH) Interventions    Readmission Risk Interventions     No data to display

## 2022-03-12 NOTE — Progress Notes (Addendum)
Subjective: 66 year old female with a history of T2DM, HTN, CVAx2 without residual deficits, HLD, and depression presents for multiple falls, decreased appetite and weight loss and admitted for electrolyte derangements and multiple falls.  Overnight events: none  On exam, pt reports she is feeling better. States her appetite has gotten better and she has been able to tolerate PO. Denies any abdominal pain, nausea, or vomiting. No further falls reported.   Objective:  Vital signs in last 24 hours: Vitals:   03/11/22 1659 03/11/22 1946 03/12/22 0403 03/12/22 0826  BP: 136/85 128/78 (!) 147/92 (!) 158/84  Pulse: 93 95 85 94  Resp: _0 Temp: 98.2 F (36.8 C) 98.5 F (36.9 C) 98 F (36.7 C) 98 F (36.7 C)  TempSrc: Oral Oral Oral Oral  SpO2: 99% 99% 98% 98%  Weight:      Height:       Weight change:   Intake/Output Summary (Last 24 hours) at 03/12/2022 8338 Last data filed at 03/12/2022 2505 Gross per 24 hour  Intake 96.12 ml  Output 500 ml  Net -403.88 ml   Physical Exam: Constitutional: thin and frail appearing, in no acute distress HENT: normocephalic atraumatic Neck: supple Cardiovascular: regular rate and rhythm Pulmonary/Chest: normal work of breathing on room air Gastrointestinal: abdomen is soft and non-tender Neurological: alert & oriented x 3 Skin: warm and dry Psych: normal mood and behavior  Assessment/Plan:  Principal Problem: Frequent Falls  66 year old female with a history of T2DM, HTN, CVAx2 without residual deficits, HLD, and depression presents for multiple falls, decreased appetite and weight loss and admitted for electrolyte derangements and multiple falls.   Frequent Falls/ Malnutrition Pt reports being able to tolerate PO. She was evaluated by registered dietician yesterday and placed on ensure TID, daily multivitamin injection, and magic cup TID with meals. Plan to continue this and monitor PO intake. Still suspect most recent  episode of falling was due to generalized weakness vs syncopal event.  Electrolyte derangement Pt still with hypomagnesemia at 1.4 this morning. Replenish with IV magnesium sulfate and recheck. Pt still hypokalemic at 3.3. Replenish with oral potassium chloride and recheck. Calcium has improved from 10.1 to 9.3. Still unclear as to the cause of the hypercalcemia. ESR was elevated at 50 with CRP within normal limits. PTH is pending. If abnormal, pt will need outpatient follow up on this. Hypophosphatemia has improved to 3.7 following repletion with sodium phosphate.   AKI BUN has improved from 39 to 32. Creatinine still slightly elevated at 1.08. Overall kidney function seems to be improving with administration of IV fluids.  Iron deficient anemia Hgb initially 8.1 with normal iron panel and low normal ferritin. On ferrous sulfate at home. Continue ferrous sulfate 325 mg BID with meals.   Hx of depression Pt previously been admitted for suicidal ideations several years ago. Currently on lexapro at home. Plan to continue with 59m daily.  Diet: Normal IVF: None,None VTE: SCDs Code: DNR PT/OT recs: SNF for Subacute PT   Dispo: Anticipated discharge to Skilled nursing facility in 1-2 days pending improvement of electrolytes and continued PO intake.      LOS: 2 days   NHennie DuosI, Medical Student 03/12/2022, 9:10 AM   Attestation for Student Documentation:  I personally was present and performed or re-performed the history, physical exam and medical decision-making activities of this service and have verified that the service and findings are accurately documented in the student's note.  Improvement in patient's electrolyte abnormalities. Pending PTH level, if low will send out for [facility]. Would be a great candidate for PACE, suspect component of depression contributing to her poor PO intake.   Riesa Pope, MD 03/12/2022, 9:51 PM

## 2022-03-12 NOTE — NC FL2 (Signed)
[place] LEVEL OF CARE SCREENING TOOL     IDENTIFICATION  Patient Name: Ashlee Austin Birthdate: Dec 09, 1955 Sex: female Admission Date (Current Location): 03/10/2022  [facility] and [place] Number:  [employer] and Address:  The [place]. [facility] [place] [address], [place], [place] [address]      Provider Number: [id number]  Attending Physician Name and Address:  Angelica Pou, MD  Relative Name and Phone Number:  Lelan Pons [phone]    Current Level of Care: Hospital Recommended Level of Care: [place] Prior Approval Number:    Date Approved/Denied:   PASRR Number: Pending  Discharge Plan: SNF    Current Diagnoses: Patient Active Problem List   Diagnosis Date Noted   Hypophosphatemia 03/11/2022   BMI less than 19,adult 03/11/2022   Protein-calorie malnutrition, severe ([place]) 03/11/2022   Hypomagnesemia 03/11/2022   Hypercalcemia 03/11/2022   Falls frequently 03/10/2022   Protrusion of lumbar intervertebral disc 07/06/2018   Chronic bilateral low back pain with left-sided sciatica 06/27/2018    Orientation RESPIRATION BLADDER Height & Weight     Self, Time, Situation, Place  Normal Incontinent, External catheter Weight: 84 lb 3.5 oz (38.2 kg) Height:  '5\' 2"'$  (157.5 cm)  BEHAVIORAL SYMPTOMS/MOOD NEUROLOGICAL BOWEL NUTRITION STATUS      Continent Diet (See dc summary)  AMBULATORY STATUS COMMUNICATION OF NEEDS Skin   Extensive Assist Verbally Normal                       Personal Care Assistance Level of Assistance  Bathing, Feeding, Dressing Bathing Assistance: Limited assistance Feeding assistance: Independent Dressing Assistance: Limited assistance     Functional Limitations Info  Sight, Hearing, Speech Sight Info: Impaired Hearing Info: Adequate Speech Info: Adequate    SPECIAL CARE FACTORS FREQUENCY  PT (By licensed PT), OT (By licensed OT)     PT Frequency: min 2x week OT  Frequency: min 2x week            Contractures Contractures Info: Not present    Additional Factors Info  Allergies, Code Status, Psychotropic, Insulin Sliding Scale Code Status Info: DNR Allergies Info: NKA Psychotropic Info: ALPRAZolam (XANAX) tablet 1 mg  2X a day, Insulin Sliding Scale Info: 3X daily with meals       Current Medications (03/12/2022):  This is the current hospital active medication list Current Facility-Administered Medications  Medication Dose Route Frequency Provider Last Rate Last Admin   ALPRAZolam Duanne Moron) tablet 1 mg  1 mg Oral BID Johny Blamer, DO   1 mg at 03/11/22 2133   amLODipine (NORVASC) tablet 5 mg  5 mg Oral Daily Johny Blamer, DO   5 mg at 03/11/22 5621   clopidogrel (PLAVIX) tablet 75 mg  75 mg Oral Daily Johny Blamer, DO   75 mg at 03/11/22 3086   escitalopram (LEXAPRO) tablet 10 mg  10 mg Oral Daily Johny Blamer, DO   10 mg at 03/11/22 5784   feeding supplement (ENSURE ENLIVE / ENSURE PLUS) liquid 237 mL  237 mL Oral TID BM Joni Reining C, DO   237 mL at 03/11/22 2007   ferrous sulfate tablet 325 mg  325 mg Oral BID WC Johny Blamer, DO   325 mg at 03/12/22 0827   insulin aspart (novoLOG) injection 0-6 Units  0-6 Units Subcutaneous TID WC Katsadouros, Vasilios, MD       multivitamin with minerals tablet 1 tablet  1 tablet Oral Daily  Johny Blamer, DO   1 tablet at 03/11/22 1720   pantoprazole (PROTONIX) EC tablet 40 mg  40 mg Oral BID Johny Blamer, DO   40 mg at 03/11/22 2133     Discharge Medications: Please see discharge summary for a list of discharge medications.  Relevant Imaging Results:  Relevant Lab Results:   Additional Information SSN [id number]  Loreta Ave, LCSWA

## 2022-03-12 NOTE — TOC Initial Note (Cosign Needed)
Transition of Care [facility]) - Initial/Assessment Note    Patient Details  Name: Ashlee Austin MRN: [id number] Date of Birth: September 26, 1955  Transition of Care [facility]) CM/SW Contact:    Loreta Ave, [place] Phone Number: 03/12/2022, 9:31 AM  Clinical Narrative:                 RE: Gabrial Poppell Date of Birth: 2056/03/10 Date: 03/12/22  Please be advised that the above-named patient will require a short-term nursing home stay - anticipated 30 days or less for rehabilitation and strengthening.  The plan is for return home.        Patient Goals and CMS Choice        Expected Discharge Plan and Services                                                Prior Living Arrangements/Services                       Activities of Daily Living Home Assistive Devices/Equipment: Gilford Rile (specify type) ADL Screening (condition at time of admission) Patient's cognitive ability adequate to safely complete daily activities?: Yes Is the patient deaf or have difficulty hearing?: No Does the patient have difficulty seeing, even when wearing glasses/contacts?: No Does the patient have difficulty concentrating, remembering, or making decisions?: No Patient able to express need for assistance with ADLs?: Yes Does the patient have difficulty dressing or bathing?: Yes Independently performs ADLs?: No Communication: Independent Dressing (OT): Independent Grooming: Needs assistance Is this a change from baseline?: Pre-admission baseline Feeding: Independent Bathing: Needs assistance Is this a change from baseline?: Pre-admission baseline Toileting: Independent In/Out Bed: Independent Walks in Home: Independent with device (comment) Does the patient have difficulty walking or climbing stairs?: No Weakness of Legs: Both Weakness of Arms/Hands: None  Permission Sought/Granted                  Emotional Assessment              Admission diagnosis:  Dehydration  [E86.0] Hypomagnesemia [E83.42] Falls frequently [R29.6] Failure to thrive in adult [R62.7] Fall, initial encounter [W19.XXXA] Syncope, unspecified syncope type [R55] Patient Active Problem List   Diagnosis Date Noted   Hypophosphatemia 03/11/2022   BMI less than 19,adult 03/11/2022   Protein-calorie malnutrition, severe ([place]) 03/11/2022   Hypomagnesemia 03/11/2022   Hypercalcemia 03/11/2022   Falls frequently 03/10/2022   Protrusion of lumbar intervertebral disc 07/06/2018   Chronic bilateral low back pain with left-sided sciatica 06/27/2018   PCP:  Rosalee Kaufman, PA-C Pharmacy:   [facility], [place] - [place] AT [address] [place] [address] Phone: [phone] Fax: [phone]     Social Determinants of Health (SDOH) Interventions    Readmission Risk Interventions     No data to display

## 2022-03-13 LAB — MAGNESIUM: Magnesium: 1.4 mg/dL — ABNORMAL LOW (ref 1.7–2.4)

## 2022-03-13 LAB — BASIC METABOLIC PANEL
Anion gap: 8 (ref 5–15)
BUN: 28 mg/dL — ABNORMAL HIGH (ref 8–23)
CO2: 21 mmol/L — ABNORMAL LOW (ref 22–32)
Calcium: 9.1 mg/dL (ref 8.9–10.3)
Chloride: 112 mmol/L — ABNORMAL HIGH (ref 98–111)
Creatinine, Ser: 1.05 mg/dL — ABNORMAL HIGH (ref 0.44–1.00)
GFR, Estimated: 59 mL/min — ABNORMAL LOW (ref >60)
Glucose, Bld: 188 mg/dL — ABNORMAL HIGH (ref 70–99)
Potassium: 4.3 mmol/L (ref 3.5–5.1)
Sodium: 141 mmol/L (ref 135–145)

## 2022-03-13 LAB — PHOSPHORUS: Phosphorus: 3 mg/dL (ref 2.5–4.6)

## 2022-03-13 LAB — VITAMIN D 25 HYDROXY (VIT D DEFICIENCY, FRACTURES): Vit D, 25-Hydroxy: 58.47 ng/mL (ref 30–100)

## 2022-03-13 LAB — GLUCOSE, CAPILLARY
Glucose-Capillary: 182 mg/dL — ABNORMAL HIGH (ref 70–99)
Glucose-Capillary: 410 mg/dL — ABNORMAL HIGH (ref 70–99)
Glucose-Capillary: 463 mg/dL — ABNORMAL HIGH (ref 70–99)
Glucose-Capillary: 198 mg/dL — ABNORMAL HIGH (ref 70–99)
Glucose-Capillary: 206 mg/dL — ABNORMAL HIGH (ref 70–99)

## 2022-03-13 LAB — GLUCOSE, RANDOM: Glucose, Bld: 461 mg/dL — ABNORMAL HIGH (ref 70–99)

## 2022-03-13 MED ORDER — LACTATED RINGERS IV SOLN: INTRAVENOUS | Status: AC

## 2022-03-13 MED ORDER — MAGNESIUM SULFATE 4 GM/100ML IV SOLN
4.0000 g | Freq: Once | INTRAVENOUS | Status: AC
Start: 2022-03-13 — End: 2022-03-13
  Administered 2022-03-13: 4 g via INTRAVENOUS
  Filled 2022-03-13: qty 100

## 2022-03-13 MED ORDER — INSULIN ASPART 100 UNIT/ML IJ SOLN
15.0000 [IU] | Freq: Once | INTRAMUSCULAR | Status: AC
Start: 2022-03-13 — End: 2022-03-13
  Administered 2022-03-13: 15 [IU] via SUBCUTANEOUS

## 2022-03-13 NOTE — Progress Notes (Addendum)
HD#3 Subjective:   Summary: Ashlee Austin is a 66 year old female with PMH of T2DM, HTN, CVA without residual deficits, HDL and depression presents for multiple falls, decreased appetite and weight loss and admitted for electrolyte derangements and multiple falls.   Overnight Events: none  Ashlee Austin reports feeling better with improved strength after electrolyte repletion and increased po intake. Her appetite has improved and she ate most of her breakfast.   Objective:  Vital signs in last 24 hours: Vitals:   03/12/22 2050 03/13/22 0506 03/13/22 0800 03/13/22 1558  BP: (!) 143/81 (!) 155/88 (!) 159/77 120/66  Pulse: 89 81 83 92  Resp: '16 18 17 18  '$ Temp: 98.7 F (37.1 C) 98.2 F (36.8 C) 98.2 F (36.8 C) 98 F (36.7 C)  TempSrc: Oral Oral Oral   SpO2: 99% 98% 100% 100%  Weight:      Height:       Supplemental O2: Room Air SpO2: 100 %   Physical Exam:  Constitutional: thin and frail appearing, in no acute distress HENT: normocephalic atraumatic Neck: supple Cardiovascular: regular rate and rhythm Pulmonary/Chest: normal work of breathing on room air Neurological: alert & oriented x 3 Skin: Decreased skin turgor, warm and dry Psych: normal mood and behavior   Filed Weights   03/10/22 1153 03/10/22 2000  Weight: 34 kg 38.2 kg     Intake/Output Summary (Last 24 hours) at 03/13/2022 2033 Last data filed at 03/13/2022 1300 Gross per 24 hour  Intake 460 ml  Output --  Net 460 ml   Net IO Since Admission: 1,859.64 mL [03/13/22 2033]  Pertinent Labs:    Latest Ref Rng & Units 03/11/2022   12:47 AM 03/10/2022   12:15 PM 03/10/2022   12:08 PM  CBC  WBC 4.0 - 10.5 K/uL 8.9   11.1   Hemoglobin 12.0 - 15.0 g/dL 8.1  10.2  9.9   Hematocrit 36.0 - 46.0 % 24.3  30.0  30.9   Platelets 150 - 400 K/uL 349   394        Latest Ref Rng & Units 03/13/2022   12:15 PM 03/13/2022    7:41 AM 03/12/2022    1:45 AM  CMP  Glucose 70 - 99 mg/dL 461  188  169   BUN 8 - 23 mg/dL   28  32   Creatinine 0.44 - 1.00 mg/dL  1.05  1.08   Sodium 135 - 145 mmol/L  141  139   Potassium 3.5 - 5.1 mmol/L  4.3  3.3   Chloride 98 - 111 mmol/L  112  110   CO2 22 - 32 mmol/L  21  21   Calcium 8.9 - 10.3 mg/dL  9.1  9.3     Imaging: No results found.  Assessment/Plan:   Principal Problem:   Falls frequently Active Problems:   Hypophosphatemia   BMI less than 19,adult   Protein-calorie malnutrition, severe (HCC)   Hypomagnesemia   Hypercalcemia   Patient Summary: Ashlee Austin is a 66 y.o. with PMH of T2DM, HTN, CVA without residual deficits, HDL and depression presents for multiple falls, decreased appetite and weight loss and admitted for electrolyte derangements and multiple falls.    Frequent falls 2/2 severe malnutrition BMI <19 Multiple falls likely secondary to weakness from severe malnutrition and poor po intake. Continue replete electrolytes. Do not believe this to be vasovagal or cardiogenic syncope related. Her appetite and p.o. intake improved. PT and OT recommend SNF. TOC pending  SNF status.  -pending SNF placement -Ensure supplements, MVI, magic cups  AKI Creatinine decreasing so AKI improving with IV fluids and better po intake. Likely from dehydration. Will continue to monitor.  -LR 75 ml/hr 10 hours  Hypercalcemia Hypomagnesemia Hypophosphatemia, resolved Mg 1.4 today so giving 4 g today. Phosphorus 3. Calcium on admission was 11.9, trending down to 9.1. PTH low 7. Vit D normal.  -4 g mag sulfate, repeat magnesium level -repeat BMP, replete electrolytes if needed -pending PTHrP, calcitriol  T2DM CBGs up to 400s today. Home meds include metformin 1000 mg BID, glipizide 5 mg daily and januvia 50 mg daily. Hyperglycemia likely from recently improved po intake. Adjusted diet.  -SSI (moderate)  Iron deficiency anemia Iron panel and ferritin low normal. She is on ferrous sulfate at home. -ferrous sulfate 325 mg BID with meals -CBC  Hx of  depression Per son, patient has history of admission and hospitalization for suicide several years ago. He is concerned her depression is affecting her appetite. On lexapro at home. -lexapro 10 mg daily   Diet: Carb-Modified IVF: LR,75cc/hr VTE: SCDs Code: DNR PT/OT recs: SNF for Subacute PT Family Update: discussed with son via phone about updates  Dispo: Anticipated discharge to Skilled nursing facility in 1-2 days pending acute conditions and SNF placement.   Angelique Blonder, DO Internal Medicine Resident PGY-1 Please contact the on call pager after 5 pm and on weekends at [phone].

## 2022-03-14 DIAGNOSIS — E119 Type 2 diabetes mellitus without complications: Secondary | ICD-10-CM

## 2022-03-14 DIAGNOSIS — E878 Other disorders of electrolyte and fluid balance, not elsewhere classified: Secondary | ICD-10-CM

## 2022-03-14 DIAGNOSIS — Z7984 Long term (current) use of oral hypoglycemic drugs: Secondary | ICD-10-CM

## 2022-03-14 DIAGNOSIS — N179 Acute kidney failure, unspecified: Secondary | ICD-10-CM | POA: Diagnosis not present

## 2022-03-14 DIAGNOSIS — E46 Unspecified protein-calorie malnutrition: Secondary | ICD-10-CM | POA: Diagnosis not present

## 2022-03-14 DIAGNOSIS — R296 Repeated falls: Secondary | ICD-10-CM | POA: Diagnosis not present

## 2022-03-14 LAB — CBC
HCT: 25.3 % — ABNORMAL LOW (ref 36.0–46.0)
Hemoglobin: 8.2 g/dL — ABNORMAL LOW (ref 12.0–15.0)
MCH: 30.8 pg (ref 26.0–34.0)
MCHC: 32.4 g/dL (ref 30.0–36.0)
MCV: 95.1 fL (ref 80.0–100.0)
Platelets: 426 10*3/uL — ABNORMAL HIGH (ref 150–400)
RBC: 2.66 MIL/uL — ABNORMAL LOW (ref 3.87–5.11)
RDW: 13.2 % (ref 11.5–15.5)
WBC: 10.4 10*3/uL (ref 4.0–10.5)
nRBC: 0 % (ref 0.0–0.2)

## 2022-03-14 LAB — BASIC METABOLIC PANEL
Anion gap: 10 (ref 5–15)
BUN: 37 mg/dL — ABNORMAL HIGH (ref 8–23)
CO2: 21 mmol/L — ABNORMAL LOW (ref 22–32)
Calcium: 9.2 mg/dL (ref 8.9–10.3)
Chloride: 106 mmol/L (ref 98–111)
Creatinine, Ser: 0.99 mg/dL (ref 0.44–1.00)
GFR, Estimated: >60 mL/min (ref >60)
Glucose, Bld: 248 mg/dL — ABNORMAL HIGH (ref 70–99)
Potassium: 4.3 mmol/L (ref 3.5–5.1)
Sodium: 137 mmol/L (ref 135–145)

## 2022-03-14 LAB — GLUCOSE, CAPILLARY
Glucose-Capillary: 177 mg/dL — ABNORMAL HIGH (ref 70–99)
Glucose-Capillary: 308 mg/dL — ABNORMAL HIGH (ref 70–99)
Glucose-Capillary: 300 mg/dL — ABNORMAL HIGH (ref 70–99)
Glucose-Capillary: 272 mg/dL — ABNORMAL HIGH (ref 70–99)

## 2022-03-14 LAB — MAGNESIUM: Magnesium: 2.9 mg/dL — ABNORMAL HIGH (ref 1.7–2.4)

## 2022-03-14 LAB — CALCITRIOL (1,25 DI-OH VIT D): Vit D, 1,25-Dihydroxy: 71 pg/mL (ref 24.8–81.5)

## 2022-03-14 LAB — PHOSPHORUS: Phosphorus: 3.5 mg/dL (ref 2.5–4.6)

## 2022-03-14 MED ORDER — BUPIVACAINE HCL (PF) 0.25 % IJ SOLN
INTRAMUSCULAR | Status: AC
  Filled 2022-03-14: qty 30

## 2022-03-14 MED ORDER — ORAL CARE MOUTH RINSE: 15.0000 mL | OROMUCOSAL | Status: DC | PRN

## 2022-03-14 NOTE — Care Management Important Message (Signed)
Important Message  Patient Details  Name: Ashlee Austin MRN: [id number] Date of Birth: [birth year]   Medicare Important Message Given:  Yes     Hannah Beat 03/14/2022, 12:18 PM

## 2022-03-14 NOTE — Progress Notes (Addendum)
Marland Kitchen  Subjective:  66 year old female with PMH of T2DM, HTN, CVA without residual deficits, HDL and depression presents for multiple falls, decreased appetite, weight loss and admitted for electrolyte derangements and multiple falls.   Overnight Events: None  Pt continues to tolerate food and fluids. She was able to eat her breakfast this morning. States she was having some mild abdominal discomfort this morning which has since resolved. Denies any associated nausea or vomiting.   Objective:  Vital signs in last 24 hours: Vitals:   03/13/22 1558 03/13/22 2051 03/14/22 0521 03/14/22 0741  BP: 120/66 (!) 144/79 (!) 150/83 (!) 156/81  Pulse: 92 88 78 87  Resp: $Remo'18 17 17 18  'NQDLG$ Temp: 98 F (36.7 C) 98.3 F (36.8 C) 98.1 F (36.7 C) 98.3 F (36.8 C)  TempSrc:  Oral Oral Oral  SpO2: 100% 100% 99% 100%  Weight:      Height:       Weight change:   Intake/Output Summary (Last 24 hours) at 03/14/2022 1223 Last data filed at 03/13/2022 1300 Gross per 24 hour  Intake 220 ml  Output --  Net 220 ml   Constitutional: thin and frail appearing, in no acute distress HENT: normocephalic atraumatic Neck: supple Cardiovascular: regular rate and rhythm Pulmonary/Chest: normal work of breathing on room air Neurological: alert & oriented x 3 Skin: decreased skin turgor, warm and dry Psych: normal mood and behavior   Assessment/Plan:  Principal Problem:   Falls frequently Active Problems:   Hypophosphatemia   BMI less than 19,adult   Protein-calorie malnutrition, severe (HCC)   Hypomagnesemia   Hypercalcemia 66 year old female with PMH of T2DM, HTN, CVA without residual deficits, HDL and depression presents for multiple falls, decreased appetite and weight loss and admitted for electrolyte derangements and multiple falls.   Frequent Falls/Malnutrition Pt continues to tolerate adequate PO along with daily supplements of ensure, magic cups, and multivitamin. - continue daily dietary  supplements and PO monitoring   AKI BUN was slightly elevated at 37 from 28 yesterday. Creatinine remains within WNL. Questioned patient on history of gastric ulcers or other GI bleed which she denied. States she is on the daily Protonix for her history of epigastric discomfort and records reveal history of GERD  Electrolyte abnormalities Hypomagnesemia, and hyphosphotemia have resolved. Calcium is now WNL but initial cause of the abnormality remains unclear. PTH was slightly low at 7. - pending PTHrP and calcitriol - SPEP and UPEP pending for multiple myeloma work up  T2DM Blood glucose at 248 improved from 461 yesterday after carb modified diet. Home meds are metformin 1000 mg BID, glipizide 5 mg daily, and Januvia 50 mg daily. - continue insulin x3 daily with meals  Iron deficiency anemia Pt on ferrous sulfate at home.  - continue ferrous sulfate 325 mg BID with meals  Hx of depression Pt has history of depression with prior hospitalization for suicidal ideations several years ago per son. She is on Lexapro at home.  - continue Lexapro 10 mg daily  Diet: Carb-Modified IVF: LR,75cc/hr VTE: SCDs Code: DNR PT/OT recs: SNF for Subacute PT    Dispo: Anticipated discharge to Skilled nursing facility in 1-2 days pending SNF placement     LOS: 4 days   Hennie Duos I, Medical Student 03/14/2022, 12:23 PM

## 2022-03-14 NOTE — Progress Notes (Signed)
Occupational Therapy Treatment Patient Details Name: Ashlee Austin MRN: [id number] DOB: 1956-05-13 Today's Date: 03/14/2022   History of present illness 65 y/o female presented to ED on 03/10/22 following 5 falls in 48 hours. Recently seen at [facility] ED for similar concerns and found to have hypomagnesemia and suspected UTI. Admitted for AKI and frequent falls. PMH: T2DM, HTN, CVAx2, depression, COPD, CAD   OT comments  Pt progressing well towards OT goals though remains a high fall risk d/t standing balance deficits. Pt able to complete mobility to/from bathroom using RW with min guard and assistance to correct one LOB. Pt able to demo UB ADLs with Setup Assist and min guard for LB ADLs assessed today. Provided fall prevention handout and education with pt receptive to this information. Continue to rec SNF as pt at high risk for continued recurrent falls.    Recommendations for follow up therapy are one component of a multi-disciplinary discharge planning process, led by the attending physician.  Recommendations may be updated based on patient status, additional functional criteria and insurance authorization.    Follow Up Recommendations  Skilled nursing-short term rehab (<3 hours/day)    Assistance Recommended at Discharge Frequent or constant Supervision/Assistance  Patient can return home with the following  A little help with walking and/or transfers;Assistance with cooking/housework;Assist for transportation;Help with stairs or ramp for entrance;A little help with bathing/dressing/bathroom   Equipment Recommendations  Other (comment) (TBD pending progress)    Recommendations for Other Services      Precautions / Restrictions Precautions Precautions: Fall Precaution Comments: frequent falls Restrictions Weight Bearing Restrictions: No       Mobility Bed Mobility Overal bed mobility: Needs Assistance Bed Mobility: Supine to Sit, Sit to Supine     Supine to sit:  Supervision Sit to supine: Supervision        Transfers Overall transfer level: Needs assistance Equipment used: Rolling walker (2 wheels) Transfers: Sit to/from Stand Sit to Stand: Min guard, Supervision           General transfer comment: varied from supervision to min guard with and without RW with min guard needed when not using AD to stand at sink with minor LOB noted     Balance Overall balance assessment: Needs assistance, History of Falls Sitting-balance support: No upper extremity supported, Feet supported Sitting balance-Leahy Scale: Fair     Standing balance support: Bilateral upper extremity supported, Reliant on assistive device for balance Standing balance-Leahy Scale: Poor                             ADL either performed or assessed with clinical judgement   ADL Overall ADL's : Needs assistance/impaired     Grooming: Supervision/safety;Standing;Oral care Grooming Details (indicate cue type and reason): also assisted with shampoo cap seated at sink Upper Body Bathing: Set up;Sitting   Lower Body Bathing: Min guard;Sit to/from stand Lower Body Bathing Details (indicate cue type and reason): able to perform peri care in standing, mild unsteadiness standing without UE support Upper Body Dressing : Set up;Sitting       Toilet Transfer: Min Patent examiner Details (indicate cue type and reason): use of BSC prior to mobilizing to bathroom for bathing task Toileting- Clothing Manipulation and Hygiene: Supervision/safety;Sitting/lateral lean Toileting - Clothing Manipulation Details (indicate cue type and reason): performed hygiene seated on BSC     Functional mobility during ADLs: Min guard;Rolling walker (2 wheels) General ADL Comments: Limited primarily  by balance deficits requiring UE support in standing throughout. provided fall prevention handout and education `    Extremity/Trunk Assessment Upper Extremity  Assessment Upper Extremity Assessment: Generalized weakness   Lower Extremity Assessment Lower Extremity Assessment: Defer to PT evaluation        Vision   Vision Assessment?: No apparent visual deficits   Perception     Praxis      Cognition Arousal/Alertness: Awake/alert Behavior During Therapy: WFL for tasks assessed/performed Overall Cognitive Status: No family/caregiver present to determine baseline cognitive functioning                                 General Comments: pleasant, appears [facility] for basic tasks though some decreased safety awareness and question some memory deficits        Exercises      Shoulder Instructions       General Comments VSS on RA    Pertinent Vitals/ Pain       Pain Assessment Pain Assessment: No/denies pain  Home Living                                          Prior Functioning/Environment              Frequency  Min 2X/week        Progress Toward Goals  OT Goals(current goals can now be found in the care plan section)  Progress towards OT goals: Progressing toward goals  Acute Rehab OT Goals Patient Stated Goal: prevent falls, go to a rehab closer to home OT Goal Formulation: With patient Time For Goal Achievement: 03/25/22 Potential to Achieve Goals: Good ADL Goals Pt Will Perform Lower Body Bathing: Independently;sit to/from stand;sitting/lateral leans Pt Will Perform Lower Body Dressing: Independently;sitting/lateral leans;sit to/from stand Pt Will Transfer to Toilet: Independently;ambulating Pt Will Perform Toileting - Clothing Manipulation and hygiene: Independently;sit to/from stand;sitting/lateral leans Pt/caregiver will Perform Home Exercise Program: Increased ROM;Increased strength;Both right and left upper extremity;With theraband;With minimal assist;With written HEP provided Additional ADL Goal #1: Patient will demonstrate increased activity tolerance in order to complete  functional task in standing for 3-5 minutes without needing a seated rest break.  Plan Discharge plan remains appropriate    Co-evaluation                 AM-PAC OT "6 Clicks" Daily Activity     Outcome Measure   Help from another person eating meals?: None Help from another person taking care of personal grooming?: A Little Help from another person toileting, which includes using toliet, bedpan, or urinal?: A Little Help from another person bathing (including washing, rinsing, drying)?: A Little Help from another person to put on and taking off regular upper body clothing?: A Little Help from another person to put on and taking off regular lower body clothing?: A Little 6 Click Score: 19    End of Session Equipment Utilized During Treatment: Gait belt;Rolling walker (2 wheels)  OT Visit Diagnosis: Unsteadiness on feet (R26.81);Other abnormalities of gait and mobility (R26.89);Repeated falls (R29.6);Muscle weakness (generalized) (M62.81);History of falling (Z91.81)   Activity Tolerance Patient tolerated treatment well   Patient Left in bed;with call bell/phone within reach;with bed alarm set   Nurse Communication Mobility status        Time: 1324-4010 OT Time Calculation (min): 27 min  Charges: OT General Charges $OT Visit: 1 Visit OT Treatments $Self Care/Home Management : 23-37 mins  Malachy Chamber, OTR/L Acute Rehab Services Office: [phone]   Layla Maw 03/14/2022, 2:42 PM

## 2022-03-14 NOTE — TOC Progression Note (Addendum)
Transition of Care [facility]) - Progression Note    Patient Details  Name: Ashlee Austin MRN: [id number] Date of Birth: March 24, 1956  [facility]) CM/SW Contact  Reece Agar, [place] Phone Number: 03/14/2022, 4:45 PM  Clinical Narrative:    CSW spoke with melanie at [facility], she and her team are still reviewing pt for bed offer.   CSW attempted to follow up with [facility], no answer CSW left a VM.  CSW attempted to contact pt son, no answer CSW left a VM.   Pt son contacted CSW back, CSW shared that pt only has one offer so far and [facility] has not made an offer yet. Pt son agreed for pt to be faxed out in [place] but wanted [facility] bc he lives up the street. CSW will follow up with Melanie I the  morning on bed offer and follow up with pt son on next facility options.    CSW will continue to follow for bed offers.         Expected Discharge Plan and Services                                                 Social Determinants of Health (SDOH) Interventions    Readmission Risk Interventions     No data to display

## 2022-03-14 NOTE — Inpatient Diabetes Management (Signed)
Inpatient Diabetes Program Recommendations  AACE/ADA: New Consensus Statement on Inpatient Glycemic Control (2015)  Target Ranges:  Prepandial:   less than 140 mg/dL      Peak postprandial:   less than 180 mg/dL (1-2 hours)      Critically ill patients:  140 - 180 mg/dL   Lab Results  Component Value Date   GLUCAP 177 (H) 03/14/2022   HGBA1C 6.0 (H) 03/10/2022    Review of Glycemic Control  Latest Reference Range & Units 03/13/22 11:30 03/13/22 12:02 03/13/22 15:58 03/13/22 20:50 03/14/22 07:39  Glucose-Capillary 70 - 99 mg/dL 410 (H) 463 (H) 198 (H) 206 (H) 177 (H)  (H): Data is abnormally high Diabetes history: type 2 Dm Outpatient Diabetes medications: Januvia 50 mg QD, Metformin 1000 mg BID, Glipizide 5 mg QD Current orders for Inpatient glycemic control: Novolog 0-15 units TID  Inpatient Diabetes Program Recommendations:    Consider adding Levemir 6 units QD.   Of note, question accuracy of A1C given Hemoglobin < 10%.   Thanks, Bronson Curb, MSN, RNC-OB Diabetes Coordinator [phone] (8a-5p)

## 2022-03-14 NOTE — Progress Notes (Signed)
Mobility Specialist - Progress Note   03/14/22 1141  Mobility  Activity Transferred from bed to chair  Level of Assistance Standby assist, set-up cues, supervision of patient - no hands on  Assistive Device None  Distance Ambulated (ft) 5 ft  Activity Response Tolerated well  $Mobility charge 1 Mobility   Pt received in bed wanting to move to chair. Used BSC. Transferred to chair and left w/ call bell and all needs met, chair alarm on.   Paulla Dolly Mobility Specialist

## 2022-03-14 NOTE — Progress Notes (Signed)
Mobility Specialist - Progress Note   03/14/22 1034  Mobility  Activity Ambulated with assistance in hallway  Level of Assistance Contact guard assist, steadying assist  Assistive Device Front wheel walker  Distance Ambulated (ft) 150 ft  Activity Response Tolerated well  $Mobility charge 1 Mobility    Pt received in bed agreeable to mobility. Took seated break x1. Left EOB w/ call bell and all needs met.   Paulla Dolly Mobility Specialist

## 2022-03-15 DIAGNOSIS — E46 Unspecified protein-calorie malnutrition: Secondary | ICD-10-CM | POA: Diagnosis not present

## 2022-03-15 DIAGNOSIS — R54 Age-related physical debility: Secondary | ICD-10-CM

## 2022-03-15 DIAGNOSIS — E878 Other disorders of electrolyte and fluid balance, not elsewhere classified: Secondary | ICD-10-CM | POA: Diagnosis not present

## 2022-03-15 DIAGNOSIS — E1169 Type 2 diabetes mellitus with other specified complication: Secondary | ICD-10-CM

## 2022-03-15 DIAGNOSIS — R296 Repeated falls: Secondary | ICD-10-CM | POA: Diagnosis not present

## 2022-03-15 LAB — CBC
HCT: 22.9 % — ABNORMAL LOW (ref 36.0–46.0)
Hemoglobin: 7.6 g/dL — ABNORMAL LOW (ref 12.0–15.0)
MCH: 31.3 pg (ref 26.0–34.0)
MCHC: 33.2 g/dL (ref 30.0–36.0)
MCV: 94.2 fL (ref 80.0–100.0)
Platelets: 478 10*3/uL — ABNORMAL HIGH (ref 150–400)
RBC: 2.43 MIL/uL — ABNORMAL LOW (ref 3.87–5.11)
RDW: 13.3 % (ref 11.5–15.5)
WBC: 8.8 10*3/uL (ref 4.0–10.5)
nRBC: 0 % (ref 0.0–0.2)

## 2022-03-15 LAB — PHOSPHORUS: Phosphorus: 3.9 mg/dL (ref 2.5–4.6)

## 2022-03-15 LAB — LACTATE DEHYDROGENASE: LDH: 104 U/L (ref 98–192)

## 2022-03-15 LAB — BASIC METABOLIC PANEL
Anion gap: 7 (ref 5–15)
BUN: 38 mg/dL — ABNORMAL HIGH (ref 8–23)
CO2: 22 mmol/L (ref 22–32)
Calcium: 9.2 mg/dL (ref 8.9–10.3)
Chloride: 109 mmol/L (ref 98–111)
Creatinine, Ser: 1.08 mg/dL — ABNORMAL HIGH (ref 0.44–1.00)
GFR, Estimated: 57 mL/min — ABNORMAL LOW (ref >60)
Glucose, Bld: 230 mg/dL — ABNORMAL HIGH (ref 70–99)
Potassium: 4.7 mmol/L (ref 3.5–5.1)
Sodium: 138 mmol/L (ref 135–145)

## 2022-03-15 LAB — GLUCOSE, CAPILLARY
Glucose-Capillary: 190 mg/dL — ABNORMAL HIGH (ref 70–99)
Glucose-Capillary: 327 mg/dL — ABNORMAL HIGH (ref 70–99)
Glucose-Capillary: 205 mg/dL — ABNORMAL HIGH (ref 70–99)
Glucose-Capillary: 256 mg/dL — ABNORMAL HIGH (ref 70–99)

## 2022-03-15 LAB — MAGNESIUM: Magnesium: 1.6 mg/dL — ABNORMAL LOW (ref 1.7–2.4)

## 2022-03-15 MED ORDER — MAGNESIUM SULFATE 2 GM/50ML IV SOLN
2.0000 g | Freq: Once | INTRAVENOUS | Status: AC
Start: 2022-03-15 — End: 2022-03-15
  Administered 2022-03-15: 2 g via INTRAVENOUS
  Filled 2022-03-15: qty 50

## 2022-03-15 MED ORDER — INSULIN ASPART 100 UNIT/ML IJ SOLN
0.0000 [IU] | Freq: Every day | INTRAMUSCULAR | Status: DC
  Administered 2022-03-15: 3 [IU] via SUBCUTANEOUS

## 2022-03-15 MED ORDER — INSULIN GLARGINE-YFGN 100 UNIT/ML ~~LOC~~ SOLN
10.0000 [IU] | Freq: Every day | SUBCUTANEOUS | Status: DC
  Administered 2022-03-15 – 2022-03-16 (×2): 10 [IU] via SUBCUTANEOUS
  Filled 2022-03-15 (×3): qty 0.1

## 2022-03-15 MED ORDER — INSULIN ASPART 100 UNIT/ML IJ SOLN
0.0000 [IU] | Freq: Three times a day (TID) | INTRAMUSCULAR | Status: DC
  Administered 2022-03-15: 11 [IU] via SUBCUTANEOUS
  Administered 2022-03-15: 5 [IU] via SUBCUTANEOUS
  Administered 2022-03-16 (×2): 3 [IU] via SUBCUTANEOUS

## 2022-03-15 MED ORDER — METFORMIN HCL 500 MG PO TABS
1000.0000 mg | ORAL_TABLET | Freq: Two times a day (BID) | ORAL | Status: DC
Start: 2022-03-15 — End: 2022-03-16
  Administered 2022-03-15 – 2022-03-16 (×2): 1000 mg via ORAL
  Filled 2022-03-15 (×2): qty 2

## 2022-03-15 NOTE — TOC Progression Note (Signed)
Transition of Care [facility]) - Progression Note    Patient Details  Name: SHERRI MCARTHY MRN: [id number] Date of Birth: 1955-12-18  Transition of Care [facility]) CM/SW [place], [place] Phone Number: 03/15/2022, 2:04 PM  Clinical Narrative:    CSW spoke with pt son about bed at [facility] [place] informed pt son that pt was offered the bed and they have started auth this morning. DC pending auth and medical stability.         Expected Discharge Plan and Services                                                 Social Determinants of Health (SDOH) Interventions    Readmission Risk Interventions     No data to display

## 2022-03-15 NOTE — Progress Notes (Signed)
Mobility Specialist Progress Note:   03/15/22 1140  Mobility  Activity Transferred to/from [facility];Transferred from chair to bed  Level of Assistance Contact guard assist, steadying assist  Assistive Device Other (Comment) (HHA)  Distance Ambulated (ft) 5 ft  Activity Response Tolerated well  $Mobility charge 1 Mobility   Pt received attempting to get out of chair, chair alarm going off. Pt requesting to void on BSC, then get back in bed. Required minG to CGA d/t unsteadiness. Pt left in bed with all needs met, bed alarm on.  Nelta Numbers Acute Rehab Secure Chat or Office Phone: [phone]

## 2022-03-15 NOTE — Progress Notes (Signed)
.  Subjective:  66 year old female with PMH of T2DM, HTN, CVA without residual deficits, HDL and depression presents for multiple falls, decreased appetite, weight loss and admitted for electrolyte derangements and multiple falls.   Overnight Events:  None  Subjective: Patient seen and evaluated at bedside. No complaints. Eating and drinking well. Wanting more freedom to move without needing nurses present.  Feeling well. Anticipating discharge in next couple days.   Objective: Blood pressure (!) 153/93, pulse 90, temperature 98.6 F (37 C), temperature source Oral, resp. rate 18, height '5\' 2"'$  (1.575 m), weight 38.2 kg, SpO2 99 %.  Vital signs in last 24 hours: Vitals:   03/15/22 0428 03/15/22 0649 03/15/22 0805 03/15/22 1542  BP: 132/86 (!) 153/93 (!) 161/97 125/78  Pulse: 80 90 86 88  Resp: '18 18 17 17  '$ Temp: 98 F (36.7 C) 98.6 F (37 C) 98.2 F (36.8 C) 98.7 F (37.1 C)  TempSrc: Oral Oral Oral Oral  SpO2: 99% 99% 100% 100%  Weight:      Height:       Weight change:   Intake/Output Summary (Last 24 hours) at 03/15/2022 1600 Last data filed at 03/15/2022 1209 Gross per 24 hour  Intake 900 ml  Output 601 ml  Net 299 ml   Constitutional: thin and frail appearing, in no acute distress HENT: normocephalic atraumatic Neck: supple Cardiovascular: regular rate and rhythm Pulmonary/Chest: normal work of breathing on room air Neurological: alert & oriented x 3 Skin: decreased skin turgor, warm and dry Psych: normal mood and behavior   Assessment/Plan:  Principal Problem:   Falls frequently Active Problems:   Hypophosphatemia   BMI less than 19,adult   Protein-calorie malnutrition, severe (HCC)   Hypomagnesemia   Hypercalcemia 66 year old female with PMH of T2DM, HTN, CVA without residual deficits, HDL and depression presents for multiple falls, decreased appetite and weight loss and admitted for electrolyte derangements and multiple falls.    Malnutrition Frailty/ Deconditioning Frequent falls Pt continues to tolerate adequate PO along with daily supplements of ensure, magic cups, and multivitamin. No evidence of refeeding syndrome at this time. Repleted magnesium this AM. Patient has been offered bed at [facility], insurance auth begun. She is medically stable at this time. - continue daily dietary supplements and PO monitoring  - anticipate discharge to [facility] likely tomorrow.   Electrolyte abnormalities Electrolyte abnormalities likely related to poor oral intake and weight loss prior to admission. Hypokalemia and hypercalcemia has resolved - pending PTHrP  - myeloma workup in process - [facility]  AKI Initial isolated BUN elevation suspicious for GIB. Per patient, she is on daily Protonix for her chronic epigastric discomfort. She has a history of GERD. Previous outpatient notes indicate elevated BUN. Cr appears stable today.  - continue to monitor.   T2DM Home meds are metformin 1000 mg BID, glipizide 5 mg daily, and Januvia 50 mg daily. Glucose has remained elevated despite moderate SSI.  - Will start on long acting. - continue insulin x3 daily with meals - A1c noted 6.0. Would consider discontinuing glipizide once outpatient. - Home metformin restarted today  Iron deficiency anemia Pt on ferrous sulfate at home.  - continue ferrous sulfate 325 mg BID with meals  Hx of depression Pt has history of depression with prior hospitalization for suicidal ideations several years ago per son. She is on Lexapro at home.  - continue Lexapro 10 mg daily  Diet: Carb-Modified IVF: LR,75cc/hr VTE: SCDs Code: DNR PT/OT recs: SNF for Subacute  PT    Dispo: Anticipated discharge to Skilled nursing facility in 1-2 days pending SNF placement     LOS: 5 days   Delene Ruffini, MD 03/15/2022, 4:00 PM

## 2022-03-15 NOTE — Progress Notes (Signed)
Physical Therapy Treatment Patient Details Name: Ashlee Austin MRN: [id number] DOB: 06/22/1956 Today's Date: 03/15/2022   History of Present Illness 66 y/o female presented to ED on 03/10/22 following 5 falls in 48 hours. Recently seen at [facility] ED for similar concerns and found to have hypomagnesemia and suspected UTI. Admitted for AKI and frequent falls. PMH: T2DM, HTN, CVAx2, depression, COPD, CAD    PT Comments    Pt instructed in and performed gait training. Pt reported LE fatigue as well as dizziness. Pt returned to room with vital signs stable. Pt will continue to benefit from skilled, acute care physical therapy interventions to maximize her current level of function and progress towards established goals.   Recommendations for follow up therapy are one component of a multi-disciplinary discharge planning process, led by the attending physician.  Recommendations may be updated based on patient status, additional functional criteria and insurance authorization.  Follow Up Recommendations  Skilled nursing-short term rehab (<3 hours/day) Can patient physically be transported by private vehicle: Yes   Assistance Recommended at Discharge Frequent or constant Supervision/Assistance  Patient can return home with the following A little help with walking and/or transfers;A little help with bathing/dressing/bathroom;Assistance with cooking/housework;Assist for transportation;Help with stairs or ramp for entrance   Equipment Recommendations  Other (comment) (defere to post acute rehab)    Recommendations for Other Services       Precautions / Restrictions Precautions Precautions: Fall Precaution Comments: frequent falls Restrictions Weight Bearing Restrictions: No     Mobility  Bed Mobility Overal bed mobility: Needs Assistance Bed Mobility: Supine to Sit     Supine to sit: Supervision          Transfers Overall transfer level: Needs assistance Equipment used: Rolling  walker (2 wheels) Transfers: Sit to/from Stand Sit to Stand: Min guard           General transfer comment: min guard for safety    Ambulation/Gait Ambulation/Gait assistance: Min guard Gait Distance (Feet): 125 Feet Assistive device: Rolling walker (2 wheels) Gait Pattern/deviations: Step-through pattern, Decreased stride length, Narrow base of support Gait velocity: decreased Gait velocity interpretation: <1.31 ft/sec, indicative of household ambulator   General Gait Details: Pt required cues for maintaining her COG more forwards inside RW. Pt c/o dizziness with gait- pt brought back to room with BP, HR, and SpO2 stable.   Stairs             Wheelchair Mobility    Modified Rankin (Stroke Patients Only)       Balance Overall balance assessment: Needs assistance, History of Falls Sitting-balance support: No upper extremity supported, Feet supported Sitting balance-Leahy Scale: Fair Sitting balance - Comments: posterior lean during MMT of LEs but no overt LOB posteriorly   Standing balance support: Bilateral upper extremity supported, Reliant on assistive device for balance, Single extremity supported Standing balance-Leahy Scale: Fair                              Cognition Arousal/Alertness: Awake/alert Behavior During Therapy: WFL for tasks assessed/performed Overall Cognitive Status: No family/caregiver present to determine baseline cognitive functioning                                 General Comments: seems WFL with basic mobility tasks        Exercises      General Comments General comments (skin  integrity, edema, etc.): HR, SpO2, and BP stable on RA.      Pertinent Vitals/Pain Pain Assessment Pain Assessment: No/denies pain    Home Living                          Prior Function            PT Goals (current goals can now be found in the care plan section) Acute Rehab PT Goals PT Goal Formulation: With  patient Time For Goal Achievement: 03/25/22 Potential to Achieve Goals: Fair Progress towards PT goals: Progressing toward goals    Frequency    Min 2X/week      PT Plan Current plan remains appropriate    Co-evaluation              AM-PAC PT "6 Clicks" Mobility   Outcome Measure  Help needed turning from your back to your side while in a flat bed without using bedrails?: A Little Help needed moving from lying on your back to sitting on the side of a flat bed without using bedrails?: A Little Help needed moving to and from a bed to a chair (including a wheelchair)?: A Little Help needed standing up from a chair using your arms (e.g., wheelchair or bedside chair)?: A Little Help needed to walk in hospital room?: A Little Help needed climbing 3-5 steps with a railing? : A Lot 6 Click Score: 17    End of Session Equipment Utilized During Treatment: Gait belt Activity Tolerance: Other (comment) (limited by dizziness) Patient left: in chair;with chair alarm set;with call bell/phone within reach   PT Visit Diagnosis: Unsteadiness on feet (R26.81);Repeated falls (R29.6);Muscle weakness (generalized) (M62.81);History of falling (Z91.81);Difficulty in walking, not elsewhere classified (R26.2);Adult, failure to thrive (R62.7)     Time: 0109-3235 PT Time Calculation (min) (ACUTE ONLY): 13 min  Charges:  $Gait Training: 8-22 mins                     Donna Bernard, PT    [facility] 03/15/2022, 11:03 AM

## 2022-03-15 NOTE — Progress Notes (Signed)
Mobility Specialist - Progress Note   03/15/22 1341  Mobility  Activity Ambulated with assistance in hallway  Level of Assistance Contact guard assist, steadying assist  Assistive Device Front wheel walker  Distance Ambulated (ft) 150 ft  Activity Response Tolerated well  $Mobility charge 1 Mobility    Pt received in bed and agreeable to mobility. Took seated rest break x1 d/t dizziness. Left in bed w/ call bell and all needs met.   Paulla Dolly Mobility Specialist

## 2022-03-16 DIAGNOSIS — E119 Type 2 diabetes mellitus without complications: Secondary | ICD-10-CM | POA: Diagnosis not present

## 2022-03-16 DIAGNOSIS — R69 Illness, unspecified: Secondary | ICD-10-CM | POA: Diagnosis not present

## 2022-03-16 DIAGNOSIS — D509 Iron deficiency anemia, unspecified: Secondary | ICD-10-CM | POA: Diagnosis not present

## 2022-03-16 DIAGNOSIS — N179 Acute kidney failure, unspecified: Secondary | ICD-10-CM | POA: Diagnosis not present

## 2022-03-16 DIAGNOSIS — I679 Cerebrovascular disease, unspecified: Secondary | ICD-10-CM | POA: Diagnosis not present

## 2022-03-16 DIAGNOSIS — E43 Unspecified severe protein-calorie malnutrition: Secondary | ICD-10-CM | POA: Diagnosis not present

## 2022-03-16 DIAGNOSIS — Z7984 Long term (current) use of oral hypoglycemic drugs: Secondary | ICD-10-CM | POA: Diagnosis not present

## 2022-03-16 DIAGNOSIS — R296 Repeated falls: Secondary | ICD-10-CM | POA: Diagnosis not present

## 2022-03-16 DIAGNOSIS — I10 Essential (primary) hypertension: Secondary | ICD-10-CM | POA: Diagnosis not present

## 2022-03-16 DIAGNOSIS — E878 Other disorders of electrolyte and fluid balance, not elsewhere classified: Secondary | ICD-10-CM | POA: Diagnosis not present

## 2022-03-16 LAB — BASIC METABOLIC PANEL
Anion gap: 11 (ref 5–15)
BUN: 46 mg/dL — ABNORMAL HIGH (ref 8–23)
CO2: 23 mmol/L (ref 22–32)
Calcium: 9.8 mg/dL (ref 8.9–10.3)
Chloride: 106 mmol/L (ref 98–111)
Creatinine, Ser: 1.12 mg/dL — ABNORMAL HIGH (ref 0.44–1.00)
GFR, Estimated: 54 mL/min — ABNORMAL LOW (ref >60)
Glucose, Bld: 213 mg/dL — ABNORMAL HIGH (ref 70–99)
Potassium: 4.9 mmol/L (ref 3.5–5.1)
Sodium: 140 mmol/L (ref 135–145)

## 2022-03-16 LAB — CBC
HCT: 23.7 % — ABNORMAL LOW (ref 36.0–46.0)
Hemoglobin: 7.9 g/dL — ABNORMAL LOW (ref 12.0–15.0)
MCH: 31.6 pg (ref 26.0–34.0)
MCHC: 33.3 g/dL (ref 30.0–36.0)
MCV: 94.8 fL (ref 80.0–100.0)
Platelets: 398 10*3/uL (ref 150–400)
RBC: 2.5 MIL/uL — ABNORMAL LOW (ref 3.87–5.11)
RDW: 13.2 % (ref 11.5–15.5)
WBC: 9.1 10*3/uL (ref 4.0–10.5)
nRBC: 0 % (ref 0.0–0.2)

## 2022-03-16 LAB — GLUCOSE, CAPILLARY
Glucose-Capillary: 169 mg/dL — ABNORMAL HIGH (ref 70–99)
Glucose-Capillary: 159 mg/dL — ABNORMAL HIGH (ref 70–99)

## 2022-03-16 LAB — KAPPA/LAMBDA LIGHT CHAINS
Kappa free light chain: 38.8 mg/L — ABNORMAL HIGH (ref 3.3–19.4)
Kappa, lambda light chain ratio: 1.5 (ref 0.26–1.65)
Lambda free light chains: 25.8 mg/L (ref 5.7–26.3)

## 2022-03-16 MED ORDER — LACTATED RINGERS IV SOLN
INTRAVENOUS | Status: DC
Start: 2022-03-16 — End: 2022-03-16

## 2022-03-16 MED ORDER — ENSURE ENLIVE PO LIQD: 237.0000 mL | Freq: Three times a day (TID) | ORAL | 12 refills | Status: AC

## 2022-03-16 NOTE — Progress Notes (Signed)
Occupational Therapy Treatment Patient Details Name: Ashlee Austin MRN: [id number] DOB: 1955/08/03 Today's Date: 03/16/2022   History of present illness 66 y/o female presented to ED on 03/10/22 following 5 falls in 48 hours. Recently seen at [facility] ED for similar concerns and found to have hypomagnesemia and suspected UTI. Admitted for AKI and frequent falls. PMH: T2DM, HTN, CVAx2, depression, COPD, CAD   OT comments  Pt remains eager to participate with improvements noted in static standing balance at sink with overhead reaching. Pt continues to rely on UE support for dynamic tasks with min guard needed to correct one LOB when stepping to toilet without RW. Reinforced fall prevention strategies, safety with DME with continued emphasis needed. Continue to rec SNF rehab for brief period to maximize balance and safety with daily routine.    Recommendations for follow up therapy are one component of a multi-disciplinary discharge planning process, led by the attending physician.  Recommendations may be updated based on patient status, additional functional criteria and insurance authorization.    Follow Up Recommendations  Skilled nursing-short term rehab (<3 hours/day)    Assistance Recommended at Discharge Intermittent Supervision/Assistance  Patient can return home with the following  A little help with walking and/or transfers;Assistance with cooking/housework;Assist for transportation;Help with stairs or ramp for entrance;A little help with bathing/dressing/bathroom   Equipment Recommendations  None recommended by OT    Recommendations for Other Services      Precautions / Restrictions Precautions Precautions: Fall Precaution Comments: frequent falls Restrictions Weight Bearing Restrictions: No       Mobility Bed Mobility Overal bed mobility: Modified Independent Bed Mobility: Supine to Sit                Transfers Overall transfer level: Needs  assistance Equipment used: Rolling walker (2 wheels), None Transfers: Sit to/from Stand Sit to Stand: Supervision           General transfer comment: standing from bedside, toilet with and without RW     Balance Overall balance assessment: Needs assistance, History of Falls Sitting-balance support: No upper extremity supported, Feet supported Sitting balance-Leahy Scale: Good     Standing balance support: Bilateral upper extremity supported, Reliant on assistive device for balance, Single extremity supported Standing balance-Leahy Scale: Fair Standing balance comment: reliant on UE support for dynamic tasks                           ADL either performed or assessed with clinical judgement   ADL Overall ADL's : Needs assistance/impaired     Grooming: Supervision/safety;Standing;Oral care;Wash/dry hands;Brushing hair Grooming Details (indicate cue type and reason): no  LOB with overhead reaching, bending to sink             Lower Body Dressing: Min guard;Sit to/from stand Lower Body Dressing Details (indicate cue type and reason): able to manage socks bed level bringing feet to self easily Toilet Transfer: Min guard;Ambulation;Rolling walker (2 wheels) Toilet Transfer Details (indicate cue type and reason): increased assist needed when pushing RW to side and stepping to toilet with one LOB noted and min guard to correct. less assistance needed when consistently using RW for mobility Toileting- Clothing Manipulation and Hygiene: Supervision/safety;Sitting/lateral lean;Sit to/from stand       Functional mobility during ADLs: Min guard;Rolling walker (2 wheels) General ADL Comments: Reinforced fall prevention, use of RW for dynamic tasks as balance deficits still posing fall risk for pt.    Extremity/Trunk Assessment  Upper Extremity Assessment Upper Extremity Assessment: Overall WFL for tasks assessed   Lower Extremity Assessment Lower Extremity Assessment:  Defer to PT evaluation        Vision   Vision Assessment?: No apparent visual deficits   Perception     Praxis      Cognition Arousal/Alertness: Awake/alert Behavior During Therapy: WFL for tasks assessed/performed Overall Cognitive Status: No family/caregiver present to determine baseline cognitive functioning                                 General Comments: seems WFL with basic mobility tasks though some decreased insight into safety strategies        Exercises      Shoulder Instructions       General Comments VSS on RA    Pertinent Vitals/ Pain       Pain Assessment Pain Assessment: No/denies pain  Home Living                                          Prior Functioning/Environment              Frequency  Min 2X/week        Progress Toward Goals  OT Goals(current goals can now be found in the care plan section)  Progress towards OT goals: Progressing toward goals  Acute Rehab OT Goals Patient Stated Goal: be able to regain PLOF function OT Goal Formulation: With patient Time For Goal Achievement: 03/25/22 Potential to Achieve Goals: Good ADL Goals Pt Will Perform Lower Body Bathing: Independently;sit to/from stand;sitting/lateral leans Pt Will Perform Lower Body Dressing: Independently;sitting/lateral leans;sit to/from stand Pt Will Transfer to Toilet: Independently;ambulating Pt Will Perform Toileting - Clothing Manipulation and hygiene: Independently;sit to/from stand;sitting/lateral leans Pt/caregiver will Perform Home Exercise Program: Increased ROM;Increased strength;Both right and left upper extremity;With theraband;With minimal assist;With written HEP provided Additional ADL Goal #1: Patient will demonstrate increased activity tolerance in order to complete functional task in standing for 3-5 minutes without needing a seated rest break.  Plan Discharge plan remains appropriate    Co-evaluation                  AM-PAC OT "6 Clicks" Daily Activity     Outcome Measure   Help from another person eating meals?: None Help from another person taking care of personal grooming?: A Little Help from another person toileting, which includes using toliet, bedpan, or urinal?: A Little Help from another person bathing (including washing, rinsing, drying)?: A Little Help from another person to put on and taking off regular upper body clothing?: A Little Help from another person to put on and taking off regular lower body clothing?: A Little 6 Click Score: 19    End of Session Equipment Utilized During Treatment: Rolling walker (2 wheels)  OT Visit Diagnosis: Unsteadiness on feet (R26.81);Other abnormalities of gait and mobility (R26.89);Repeated falls (R29.6);Muscle weakness (generalized) (M62.81);History of falling (Z91.81)   Activity Tolerance Patient tolerated treatment well   Patient Left in chair;with call bell/phone within reach;with chair alarm set   Nurse Communication          Time: [phone] OT Time Calculation (min): 20 min  Charges: OT General Charges $OT Visit: 1 Visit OT Treatments $Self Care/Home Management : 8-22 mins  Malachy Chamber, OTR/L [place] Office: [phone]  Layla Maw 03/16/2022, 7:57 AM

## 2022-03-16 NOTE — Progress Notes (Signed)
Mobility Specialist - Progress Note   03/16/22 1055  Mobility  Activity Ambulated with assistance in hallway  Level of Assistance Contact guard assist, steadying assist  Assistive Device Front wheel walker  Distance Ambulated (ft) 150 ft  Activity Response Tolerated well  $Mobility charge 1 Mobility    Pt received in bed and agreeable to mobility. Left in bed w/ call bell and all needs met.   Sydney Joyce Mobility Specialist  

## 2022-03-16 NOTE — Progress Notes (Signed)
Mobility Specialist Progress Note:   03/16/22 0930  Mobility  Activity Transferred from chair to bed  Level of Assistance Contact guard assist, steadying assist  Assistive Device None  Distance Ambulated (ft) 5 ft  Activity Response Tolerated well  $Mobility charge 1 Mobility   Pt requesting to get back in bed. Required minG to transfer with no AD. Pt left in bed with all needs met, bed alarm on.   Nelta Numbers Acute Rehab Secure Chat or Office Phone: [phone]

## 2022-03-16 NOTE — Discharge Summary (Signed)
Name: Ashlee Austin MRN: [id number] DOB: 1955-09-11 66 y.o. PCP: Rosine Door  Date of Admission: 03/10/2022 11:39 AM Date of Discharge: 03/16/22 Attending Physician: Dr. Daryll Drown  Discharge Diagnosis: Principal Problem:   Falls frequently Active Problems:   Hypophosphatemia   BMI less than 19,adult   Protein-calorie malnutrition, severe (HCC)   Hypomagnesemia   Hypercalcemia    Discharge Medications: Allergies as of 03/16/2022   No Known Allergies      Medication List     STOP taking these medications    glipiZIDE 5 MG 24 hr tablet Commonly known as: GLUCOTROL XL   lisinopril 10 MG tablet Commonly known as: ZESTRIL   MAGNESIUM PO       TAKE these medications    ALPRAZolam 1 MG tablet Commonly known as: XANAX Take 1 mg by mouth 2 (two) times daily.   amLODipine 5 MG tablet Commonly known as: NORVASC Take 5 mg by mouth daily.   clopidogrel 75 MG tablet Commonly known as: PLAVIX Take 75 mg by mouth daily.   escitalopram 10 MG tablet Commonly known as: LEXAPRO Take 10 mg by mouth daily.   feeding supplement Liqd Take 237 mLs by mouth 3 (three) times daily between meals.   FeroSul 325 (65 FE) MG tablet Generic drug: ferrous sulfate Take 325 mg by mouth 2 (two) times daily with a meal.   metFORMIN 1000 MG tablet Commonly known as: GLUCOPHAGE Take 1,000 mg by mouth 2 (two) times daily with a meal.   multivitamin with minerals Tabs tablet Take 1 tablet by mouth daily.   pantoprazole 40 MG tablet Commonly known as: PROTONIX TAKE 1 TABLET(40 MG) BY MOUTH TWICE DAILY What changed: See the new instructions.   PROBIOTIC PO Take 1 tablet by mouth daily.   sitaGLIPtin 50 MG tablet Commonly known as: JANUVIA Take 50 mg by mouth daily.   VITAMIN B12 PO Take 1 tablet by mouth daily.        Disposition and follow-up:   Ashlee Austin was discharged from [facility] in Stable condition.  At the hospital follow  up visit please address:  1.  Follow-up:  a. Falls - continue PT/rehab.     b. Malnutrition/ electrolyte abnormalities - ensure maintaining PO intake and stable electrolytes. Myeloma labs will need to be followed up.    c. AKI - ensure stable renal function   d. DM - glipizide stopped. Januvia and metformin continued, ensure stable CBGs  2.  Labs / imaging needed at time of follow-up: cbc, bmp  3.  Pending labs/ test needing follow-up: myeloma labs  4.  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  Frequent falls/malnutrition On initial evaluation, pt reported several months of decreased appetite. Unclear cause. She was evaluated by dietician on 08/25 and placed on Ensure TID, daily multivitamin, and magic cup TID which pt has been compliant with. She has been eating and drinking daily, with improvement in her appetite. She had been feeling consistently better. Complained of some mild abdominal pain on morning of 08/28 which has resolved. Denies any associated nausea or vomiting. Discussed SNF placement and continued monitoring as she rebuilds her strength.   Electrolyte derangement  Presented with hypomagnesemia at 1.2, hypophosphatemia at <1.0 and hypercalcemia at 1.56. Magnesium and phosphorous were replenished and normalized during her stay. Unsure as to the etiology of hypercalcemia. PTH was found low at 7 on 08/24. Myeloma labs have been ordered and are in process.  AKI Presented with elevated lactic acid at 2.8, elevated BUN of 59, and elevated creatine of 1.56. Given IV fluids with lactate and creatinine decreasing to normal range. On 08/28, BUN remained elevated at  37. Pt was asked about history of GI bleeds and ulcer which she denied. BUN and Cr slightly elevated AM or 8/30 presumed secondary to diminished PO intake. She was given ivfs and encouraged to take PO.  T2DM Presented with Hx ot T2DM currently on metformin 1000 mg BID, glipizide 5 mg daily and januvia 50 mg daily. Initial glucose was 261. Placed on insulin x3 daily with meals on 08/26. Levels spiked to the 400s on 08/27 likely due to increased PO intake. She was placed on carb modified diet with continuation of insulin. On 08/28 levels came back down to 248. Started on home metformin and long acting with slightly better control. Metformin and Januvia were restarted upon discharged. Glipizide stopped.   Iron deficient anemia Presented with low RBC at 3.16, HBG of 9.9, and Hct of 30.9. Iron panel and ferritin were low normal. She is on ferrous sulfate at home. Continued 325 mg BID with meals during her stay  Hx of depression Presented with a long history of depression. Was on Lexapro 10 mg daily at home. Continued this throughout her stay       ------------------------ 8/29: Patient states that she is feeling well overall. States that she is eating and drinking well. Denies having issues with going to the bathroom. Denies abdominal pain. States that she is getting up and moving around when she can. Discussed plan to restart metformin today.       Discharge Subjective: Patient seen and evaluated at bedside. She reports she is feeling well. Ready to go to rehab today. No complaints.  Discharge Exam:   BP (!) 162/83 (BP Location: Right Arm)   Pulse 88   Temp 97.6 F (36.4 C) (Oral)   Resp 16   Ht '5\' 2"'$  (1.575 m)   Wt 38.2 kg   SpO2 100%   BMI 15.40 kg/m  Constitutional: thin and frail appearing, in no acute distress HENT: normocephalic atraumatic Neck: supple Cardiovascular: regular rate and rhythm Pulmonary/Chest: normal work of breathing on room air Neurological: alert &  oriented x 3 Skin: decreased skin turgor, warm and dry Psych: normal mood and behavior  Pertinent Labs, Studies, and Procedures:     Latest Ref Rng & Units 03/16/2022    3:24 AM 03/15/2022    1:22 AM 03/14/2022    1:55 AM  CBC  WBC 4.0 - 10.5 K/uL 9.1  8.8  10.4   Hemoglobin 12.0 - 15.0 g/dL 7.9  7.6  8.2   Hematocrit 36.0 - 46.0 % 23.7  22.9  25.3   Platelets 150 - 400 K/uL 398  478  426        Latest Ref Rng & Units 03/16/2022    3:24 AM 03/15/2022    1:22 AM 03/14/2022    1:55 AM  CMP  Glucose 70 - 99 mg/dL 213  230  248   BUN 8 - 23 mg/dL 46  38  37   Creatinine 0.44 - 1.00 mg/dL 1.12  1.08  0.99   Sodium  135 - 145 mmol/L 140  138  137   Potassium 3.5 - 5.1 mmol/L 4.9  4.7  4.3   Chloride 98 - 111 mmol/L 106  109  106   CO2 22 - 32 mmol/L '23  22  21   '$ Calcium 8.9 - 10.3 mg/dL 9.8  9.2  9.2     CT HEAD WO CONTRAST  Result Date: 03/10/2022 CLINICAL DATA:  Trauma EXAM: CT HEAD WITHOUT CONTRAST CT CERVICAL SPINE WITHOUT CONTRAST TECHNIQUE: Multidetector CT imaging of the head and cervical spine was performed following the standard protocol without intravenous contrast. Multiplanar CT image reconstructions of the cervical spine were also generated. RADIATION DOSE REDUCTION: This exam was performed according to the departmental dose-optimization program which includes automated exposure control, adjustment of the mA and/or kV according to patient size and/or use of iterative reconstruction technique. COMPARISON:  CT head and CT cervical spine 03/04/2022 FINDINGS: CT HEAD FINDINGS Brain: There is no acute intracranial hemorrhage, extra-axial fluid collection, or acute infarct. Parenchymal volume is normal. The ventricles are stable in size. Gray-white differentiation is preserved. There is patchy hypodensity in the supratentorial white matter likely reflecting sequela of chronic white matter microangiopathy, unchanged. There is no mass lesion.  There is no mass effect or midline shift.  Vascular: There is calcification of the bilateral cavernous ICAs. Skull: Normal. Negative for fracture or focal lesion. Sinuses/Orbits: Complete opacification of the right maxillary sinus with surrounding hyperostosis is consistent with chronic sinusitis, unchanged. The globes and orbits are unremarkable. Other: None. CT CERVICAL SPINE FINDINGS Alignment: Normal. There is no antero or retrolisthesis. There is no evidence of traumatic malalignment. Skull base and vertebrae: Skull base alignment is maintained. Vertebral body heights are preserved. There is no evidence of acute fracture. There is no suspicious osseous lesion. Soft tissues and spinal canal: No prevertebral fluid or swelling. No visible canal hematoma. Disc levels: There is minimal degenerative change with no significant spinal canal or neural foraminal stenosis. Upper chest: The imaged lung apices are clear. Other: None. IMPRESSION: 1. No acute intracranial pathology. 2. No acute fracture or traumatic malalignment of the cervical spine. Electronically Signed   By: Valetta Mole M.D.   On: 03/10/2022 13:11   CT CERVICAL SPINE WO CONTRAST  Result Date: 03/10/2022 CLINICAL DATA:  Trauma EXAM: CT HEAD WITHOUT CONTRAST CT CERVICAL SPINE WITHOUT CONTRAST TECHNIQUE: Multidetector CT imaging of the head and cervical spine was performed following the standard protocol without intravenous contrast. Multiplanar CT image reconstructions of the cervical spine were also generated. RADIATION DOSE REDUCTION: This exam was performed according to the departmental dose-optimization program which includes automated exposure control, adjustment of the mA and/or kV according to patient size and/or use of iterative reconstruction technique. COMPARISON:  CT head and CT cervical spine 03/04/2022 FINDINGS: CT HEAD FINDINGS Brain: There is no acute intracranial hemorrhage, extra-axial fluid collection, or acute infarct. Parenchymal volume is normal. The ventricles are stable  in size. Gray-white differentiation is preserved. There is patchy hypodensity in the supratentorial white matter likely reflecting sequela of chronic white matter microangiopathy, unchanged. There is no mass lesion.  There is no mass effect or midline shift. Vascular: There is calcification of the bilateral cavernous ICAs. Skull: Normal. Negative for fracture or focal lesion. Sinuses/Orbits: Complete opacification of the right maxillary sinus with surrounding hyperostosis is consistent with chronic sinusitis, unchanged. The globes and orbits are unremarkable. Other: None. CT CERVICAL SPINE FINDINGS Alignment: Normal. There is no antero or retrolisthesis. There is no evidence of  traumatic malalignment. Skull base and vertebrae: Skull base alignment is maintained. Vertebral body heights are preserved. There is no evidence of acute fracture. There is no suspicious osseous lesion. Soft tissues and spinal canal: No prevertebral fluid or swelling. No visible canal hematoma. Disc levels: There is minimal degenerative change with no significant spinal canal or neural foraminal stenosis. Upper chest: The imaged lung apices are clear. Other: None. IMPRESSION: 1. No acute intracranial pathology. 2. No acute fracture or traumatic malalignment of the cervical spine. Electronically Signed   By: Valetta Mole M.D.   On: 03/10/2022 13:11   DG Pelvis Portable  Result Date: 03/10/2022 CLINICAL DATA:  Trauma/multiple falls with pain. EXAM: PORTABLE PELVIS 1-2 VIEWS COMPARISON:  CT abdomen pelvis dated 08/08 2023 FINDINGS: There is no evidence of pelvic fracture or diastasis. No pelvic bone lesions are seen. Minimal degenerative changes are seen in both hips. IMPRESSION: No acute osseous injury. Electronically Signed   By: Zerita Boers M.D.   On: 03/10/2022 12:21   DG Chest Port 1 View  Result Date: 03/10/2022 CLINICAL DATA:  Trauma/multiple falls with pain. EXAM: PORTABLE CHEST 1 VIEW COMPARISON:  Chest radiograph dated  01/30/2021. FINDINGS: The heart size and mediastinal contours are within normal limits. Vascular calcifications are seen in the aortic arch. The lungs are hyperinflated. Both lungs are clear. The visualized skeletal structures are unremarkable. IMPRESSION: No active disease. Aortic Atherosclerosis (ICD10-I70.0) and Emphysema (ICD10-J43.9). Electronically Signed   By: Zerita Boers M.D.   On: 03/10/2022 12:20     Discharge Instructions: Discharge Instructions     Call MD for:  difficulty breathing, headache or visual disturbances   Complete by: As directed    Call MD for:  extreme fatigue   Complete by: As directed    Call MD for:  hives   Complete by: As directed    Call MD for:  persistant dizziness or light-headedness   Complete by: As directed    Call MD for:  persistant nausea and vomiting   Complete by: As directed    Call MD for:  redness, tenderness, or signs of infection (pain, swelling, redness, odor or green/yellow discharge around incision site)   Complete by: As directed    Call MD for:  severe uncontrolled pain   Complete by: As directed    Call MD for:  temperature >100.4   Complete by: As directed    Diet - low sodium heart healthy   Complete by: As directed    Increase activity slowly   Complete by: As directed        Signed: Delene Ruffini, MD 03/16/2022, 1:24 PM   Pager: [phone]

## 2022-03-16 NOTE — TOC Transition Note (Addendum)
Transition of Care [facility]) - CM/SW Discharge Note   Patient Details  Name: Ashlee Austin MRN: [id number] Date of Birth: 08-28-1955  Transition of [facility] CM/SW Contact:  Tresa Endo Phone Number: 03/16/2022, 1:48 PM   Clinical Narrative:    Patient will DC to: [facility] Anticipated DC date: 03/16/2022 Family notified: Pt Son Transport by: Pt son   Per MD patient ready for DC to [facility]. RN to call report prior to discharge [phone]). RN, patient, patient's family, and facility notified of DC. Discharge Summary and FL2 sent to facility. DC packet on chart. Pt son will transport to SNF.  CSW will sign off for now as social work intervention is no longer needed. Please consult [place] again if new needs arise.           Patient Goals and CMS Choice        Discharge Placement                       Discharge Plan and Services                                     Social Determinants of Health (SDOH) Interventions     Readmission Risk Interventions     No data to display

## 2022-03-16 NOTE — Discharge Instructions (Signed)
Dear Ashlee Austin, thank you for trusting [place] with your care. We treated you in the hospital for falls, malnutrition, and electrolyte abnormalities. Your electrolyte abnormalities have improved with improved food intake. Please ensure that you maintain adequate oral intake. Please also make sure you are drinking enough fluids. We have stopped the glipizide that you were taking. Please continue to take your Januvia and metformin.

## 2022-03-16 NOTE — Inpatient Diabetes Management (Signed)
Inpatient Diabetes Program Recommendations  AACE/ADA: New Consensus Statement on Inpatient Glycemic Control (2015)  Target Ranges:  Prepandial:   less than 140 mg/dL      Peak postprandial:   less than 180 mg/dL (1-2 hours)      Critically ill patients:  140 - 180 mg/dL   Lab Results  Component Value Date   GLUCAP 169 (H) 03/16/2022   HGBA1C 6.0 (H) 03/10/2022    Review of Glycemic Control   Latest Reference Range & Units 03/15/22 08:06 03/15/22 12:10 03/15/22 15:43 03/15/22 19:52 03/16/22 07:58  Glucose-Capillary 70 - 99 mg/dL 190 (H) 327 (H) 205 (H) 256 (H) 169 (H)   Diabetes history: type 2 Dm Outpatient Diabetes medications: Januvia 50 mg QD, Metformin 1000 mg BID, Glipizide 5 mg QD Current orders for Inpatient glycemic control:  Novolog 0-15 units TID + HS Semglee 10 units Metformin 1000 mg bid  Ensure Enlive tid between meals  Inpatient Diabetes Program Recommendations:    NOTE: glucose trends increase after meal/supplement intake  -   Consider adding Novolog 4 units tid meal coverage if eating >50% of meals    Thanks, Tama Headings RN, MSN, BC-ADM Inpatient Diabetes Coordinator Team Pager [phone] (8a-5p)

## 2022-03-16 NOTE — Progress Notes (Signed)
[facility] Per[facility] removed. All questions/concerns addressed. Son at bedside to transport patient to SNF

## 2022-03-18 DIAGNOSIS — E43 Unspecified severe protein-calorie malnutrition: Secondary | ICD-10-CM | POA: Diagnosis not present

## 2022-03-18 DIAGNOSIS — I10 Essential (primary) hypertension: Secondary | ICD-10-CM | POA: Diagnosis not present

## 2022-03-18 LAB — PROTEIN ELECTROPHORESIS, SERUM
A/G Ratio: 1.3 (ref 0.7–1.7)
Albumin ELP: 3 g/dL (ref 2.9–4.4)
Alpha-1-Globulin: 0.2 g/dL (ref 0.0–0.4)
Alpha-2-Globulin: 0.9 g/dL (ref 0.4–1.0)
Beta Globulin: 0.7 g/dL (ref 0.7–1.3)
Gamma Globulin: 0.6 g/dL (ref 0.4–1.8)
Globulin, Total: 2.4 g/dL (ref 2.2–3.9)
Total Protein ELP: 5.4 g/dL — ABNORMAL LOW (ref 6.0–8.5)

## 2022-03-28 DIAGNOSIS — R296 Repeated falls: Secondary | ICD-10-CM | POA: Diagnosis not present

## 2022-03-28 DIAGNOSIS — Z681 Body mass index (BMI) 19 or less, adult: Secondary | ICD-10-CM | POA: Diagnosis not present

## 2022-03-28 DIAGNOSIS — E440 Moderate protein-calorie malnutrition: Secondary | ICD-10-CM | POA: Diagnosis not present

## 2022-03-28 DIAGNOSIS — Z7902 Long term (current) use of antithrombotics/antiplatelets: Secondary | ICD-10-CM | POA: Diagnosis not present

## 2022-03-28 DIAGNOSIS — Z8673 Personal history of transient ischemic attack (TIA), and cerebral infarction without residual deficits: Secondary | ICD-10-CM | POA: Diagnosis not present

## 2022-03-28 DIAGNOSIS — F17210 Nicotine dependence, cigarettes, uncomplicated: Secondary | ICD-10-CM | POA: Diagnosis not present

## 2022-03-28 DIAGNOSIS — F419 Anxiety disorder, unspecified: Secondary | ICD-10-CM | POA: Diagnosis not present

## 2022-03-28 DIAGNOSIS — I700 Atherosclerosis of aorta: Secondary | ICD-10-CM | POA: Diagnosis not present

## 2022-03-28 DIAGNOSIS — R69 Illness, unspecified: Secondary | ICD-10-CM | POA: Diagnosis not present

## 2022-03-28 DIAGNOSIS — F321 Major depressive disorder, single episode, moderate: Secondary | ICD-10-CM | POA: Diagnosis not present

## 2022-03-28 DIAGNOSIS — E119 Type 2 diabetes mellitus without complications: Secondary | ICD-10-CM | POA: Diagnosis not present

## 2022-03-28 DIAGNOSIS — Z7984 Long term (current) use of oral hypoglycemic drugs: Secondary | ICD-10-CM | POA: Diagnosis not present

## 2022-03-28 DIAGNOSIS — J449 Chronic obstructive pulmonary disease, unspecified: Secondary | ICD-10-CM | POA: Diagnosis not present

## 2022-03-30 DIAGNOSIS — Z23 Encounter for immunization: Secondary | ICD-10-CM | POA: Diagnosis not present

## 2022-03-30 DIAGNOSIS — F324 Major depressive disorder, single episode, in partial remission: Secondary | ICD-10-CM | POA: Insufficient documentation

## 2022-04-01 ENCOUNTER — Ambulatory Visit: Payer: Medicare HMO | Admitting: Gastroenterology

## 2022-04-09 DIAGNOSIS — E11319 Type 2 diabetes mellitus with unspecified diabetic retinopathy without macular edema: Secondary | ICD-10-CM | POA: Diagnosis not present

## 2022-04-09 DIAGNOSIS — H52209 Unspecified astigmatism, unspecified eye: Secondary | ICD-10-CM | POA: Diagnosis not present

## 2022-04-09 DIAGNOSIS — H524 Presbyopia: Secondary | ICD-10-CM | POA: Diagnosis not present

## 2022-04-09 DIAGNOSIS — H25019 Cortical age-related cataract, unspecified eye: Secondary | ICD-10-CM | POA: Diagnosis not present

## 2022-04-11 DIAGNOSIS — Z0100 Encounter for examination of eyes and vision without abnormal findings: Secondary | ICD-10-CM | POA: Diagnosis not present

## 2022-04-23 DIAGNOSIS — I700 Atherosclerosis of aorta: Secondary | ICD-10-CM | POA: Diagnosis not present

## 2022-04-23 DIAGNOSIS — R296 Repeated falls: Secondary | ICD-10-CM | POA: Diagnosis not present

## 2022-04-23 DIAGNOSIS — D649 Anemia, unspecified: Secondary | ICD-10-CM | POA: Diagnosis not present

## 2022-04-23 DIAGNOSIS — E1165 Type 2 diabetes mellitus with hyperglycemia: Secondary | ICD-10-CM | POA: Diagnosis not present

## 2022-04-23 DIAGNOSIS — I10 Essential (primary) hypertension: Secondary | ICD-10-CM | POA: Diagnosis not present

## 2022-04-23 DIAGNOSIS — R7989 Other specified abnormal findings of blood chemistry: Secondary | ICD-10-CM | POA: Diagnosis not present

## 2022-04-23 DIAGNOSIS — Z8673 Personal history of transient ischemic attack (TIA), and cerebral infarction without residual deficits: Secondary | ICD-10-CM | POA: Diagnosis not present

## 2022-04-23 DIAGNOSIS — R627 Adult failure to thrive: Secondary | ICD-10-CM | POA: Diagnosis not present

## 2022-04-23 DIAGNOSIS — R69 Illness, unspecified: Secondary | ICD-10-CM | POA: Diagnosis not present

## 2022-06-01 DIAGNOSIS — D649 Anemia, unspecified: Secondary | ICD-10-CM | POA: Diagnosis not present

## 2022-06-01 DIAGNOSIS — R7989 Other specified abnormal findings of blood chemistry: Secondary | ICD-10-CM | POA: Diagnosis not present

## 2022-06-01 DIAGNOSIS — E1165 Type 2 diabetes mellitus with hyperglycemia: Secondary | ICD-10-CM | POA: Diagnosis not present

## 2022-06-01 DIAGNOSIS — Z8673 Personal history of transient ischemic attack (TIA), and cerebral infarction without residual deficits: Secondary | ICD-10-CM | POA: Diagnosis not present

## 2022-06-01 DIAGNOSIS — F17210 Nicotine dependence, cigarettes, uncomplicated: Secondary | ICD-10-CM | POA: Diagnosis not present

## 2022-06-01 DIAGNOSIS — Z23 Encounter for immunization: Secondary | ICD-10-CM | POA: Diagnosis not present

## 2022-06-01 DIAGNOSIS — R69 Illness, unspecified: Secondary | ICD-10-CM | POA: Diagnosis not present

## 2022-06-01 DIAGNOSIS — I10 Essential (primary) hypertension: Secondary | ICD-10-CM | POA: Diagnosis not present

## 2022-06-01 DIAGNOSIS — R296 Repeated falls: Secondary | ICD-10-CM | POA: Diagnosis not present

## 2022-06-01 DIAGNOSIS — Z681 Body mass index (BMI) 19 or less, adult: Secondary | ICD-10-CM | POA: Diagnosis not present

## 2022-06-01 DIAGNOSIS — I700 Atherosclerosis of aorta: Secondary | ICD-10-CM | POA: Diagnosis not present

## 2022-06-01 DIAGNOSIS — R627 Adult failure to thrive: Secondary | ICD-10-CM | POA: Diagnosis not present

## 2022-06-02 DIAGNOSIS — Z7409 Other reduced mobility: Secondary | ICD-10-CM | POA: Diagnosis not present

## 2022-06-02 DIAGNOSIS — R2681 Unsteadiness on feet: Secondary | ICD-10-CM | POA: Diagnosis not present

## 2022-06-02 DIAGNOSIS — Z789 Other specified health status: Secondary | ICD-10-CM | POA: Diagnosis not present

## 2022-06-02 DIAGNOSIS — M2560 Stiffness of unspecified joint, not elsewhere classified: Secondary | ICD-10-CM | POA: Diagnosis not present

## 2022-06-02 DIAGNOSIS — M545 Low back pain, unspecified: Secondary | ICD-10-CM | POA: Diagnosis not present

## 2022-06-02 DIAGNOSIS — R29898 Other symptoms and signs involving the musculoskeletal system: Secondary | ICD-10-CM | POA: Diagnosis not present

## 2022-06-06 DIAGNOSIS — Z7409 Other reduced mobility: Secondary | ICD-10-CM | POA: Diagnosis not present

## 2022-06-06 DIAGNOSIS — Z789 Other specified health status: Secondary | ICD-10-CM | POA: Diagnosis not present

## 2022-06-06 DIAGNOSIS — R29898 Other symptoms and signs involving the musculoskeletal system: Secondary | ICD-10-CM | POA: Diagnosis not present

## 2022-06-06 DIAGNOSIS — R2681 Unsteadiness on feet: Secondary | ICD-10-CM | POA: Diagnosis not present

## 2022-06-06 DIAGNOSIS — M545 Low back pain, unspecified: Secondary | ICD-10-CM | POA: Diagnosis not present

## 2022-06-06 DIAGNOSIS — M2560 Stiffness of unspecified joint, not elsewhere classified: Secondary | ICD-10-CM | POA: Diagnosis not present

## 2022-06-13 DIAGNOSIS — M2560 Stiffness of unspecified joint, not elsewhere classified: Secondary | ICD-10-CM | POA: Diagnosis not present

## 2022-06-13 DIAGNOSIS — Z7409 Other reduced mobility: Secondary | ICD-10-CM | POA: Diagnosis not present

## 2022-06-13 DIAGNOSIS — R2681 Unsteadiness on feet: Secondary | ICD-10-CM | POA: Diagnosis not present

## 2022-06-13 DIAGNOSIS — Z789 Other specified health status: Secondary | ICD-10-CM | POA: Diagnosis not present

## 2022-06-13 DIAGNOSIS — R29898 Other symptoms and signs involving the musculoskeletal system: Secondary | ICD-10-CM | POA: Diagnosis not present

## 2022-06-13 DIAGNOSIS — M545 Low back pain, unspecified: Secondary | ICD-10-CM | POA: Diagnosis not present

## 2022-06-15 DIAGNOSIS — Z7409 Other reduced mobility: Secondary | ICD-10-CM | POA: Diagnosis not present

## 2022-06-15 DIAGNOSIS — M545 Low back pain, unspecified: Secondary | ICD-10-CM | POA: Diagnosis not present

## 2022-06-15 DIAGNOSIS — R29898 Other symptoms and signs involving the musculoskeletal system: Secondary | ICD-10-CM | POA: Diagnosis not present

## 2022-06-15 DIAGNOSIS — M2560 Stiffness of unspecified joint, not elsewhere classified: Secondary | ICD-10-CM | POA: Diagnosis not present

## 2022-06-15 DIAGNOSIS — R2681 Unsteadiness on feet: Secondary | ICD-10-CM | POA: Diagnosis not present

## 2022-06-15 DIAGNOSIS — Z789 Other specified health status: Secondary | ICD-10-CM | POA: Diagnosis not present

## 2022-06-20 DIAGNOSIS — M2560 Stiffness of unspecified joint, not elsewhere classified: Secondary | ICD-10-CM | POA: Diagnosis not present

## 2022-06-20 DIAGNOSIS — R2681 Unsteadiness on feet: Secondary | ICD-10-CM | POA: Diagnosis not present

## 2022-06-20 DIAGNOSIS — R29898 Other symptoms and signs involving the musculoskeletal system: Secondary | ICD-10-CM | POA: Diagnosis not present

## 2022-06-20 DIAGNOSIS — M545 Low back pain, unspecified: Secondary | ICD-10-CM | POA: Diagnosis not present

## 2022-06-20 DIAGNOSIS — Z7409 Other reduced mobility: Secondary | ICD-10-CM | POA: Diagnosis not present

## 2022-06-20 DIAGNOSIS — Z789 Other specified health status: Secondary | ICD-10-CM | POA: Diagnosis not present

## 2022-06-22 DIAGNOSIS — R29898 Other symptoms and signs involving the musculoskeletal system: Secondary | ICD-10-CM | POA: Diagnosis not present

## 2022-06-22 DIAGNOSIS — R2681 Unsteadiness on feet: Secondary | ICD-10-CM | POA: Diagnosis not present

## 2022-06-22 DIAGNOSIS — M2560 Stiffness of unspecified joint, not elsewhere classified: Secondary | ICD-10-CM | POA: Diagnosis not present

## 2022-06-22 DIAGNOSIS — Z7409 Other reduced mobility: Secondary | ICD-10-CM | POA: Diagnosis not present

## 2022-06-22 DIAGNOSIS — M545 Low back pain, unspecified: Secondary | ICD-10-CM | POA: Diagnosis not present

## 2022-06-22 DIAGNOSIS — Z789 Other specified health status: Secondary | ICD-10-CM | POA: Diagnosis not present

## 2022-06-27 DIAGNOSIS — M545 Low back pain, unspecified: Secondary | ICD-10-CM | POA: Diagnosis not present

## 2022-06-27 DIAGNOSIS — Z7409 Other reduced mobility: Secondary | ICD-10-CM | POA: Diagnosis not present

## 2022-06-27 DIAGNOSIS — R29898 Other symptoms and signs involving the musculoskeletal system: Secondary | ICD-10-CM | POA: Diagnosis not present

## 2022-06-27 DIAGNOSIS — R2681 Unsteadiness on feet: Secondary | ICD-10-CM | POA: Diagnosis not present

## 2022-06-27 DIAGNOSIS — Z789 Other specified health status: Secondary | ICD-10-CM | POA: Diagnosis not present

## 2022-06-27 DIAGNOSIS — M2560 Stiffness of unspecified joint, not elsewhere classified: Secondary | ICD-10-CM | POA: Diagnosis not present

## 2022-06-29 DIAGNOSIS — R29898 Other symptoms and signs involving the musculoskeletal system: Secondary | ICD-10-CM | POA: Diagnosis not present

## 2022-06-29 DIAGNOSIS — M545 Low back pain, unspecified: Secondary | ICD-10-CM | POA: Diagnosis not present

## 2022-06-29 DIAGNOSIS — M2560 Stiffness of unspecified joint, not elsewhere classified: Secondary | ICD-10-CM | POA: Diagnosis not present

## 2022-06-29 DIAGNOSIS — R2681 Unsteadiness on feet: Secondary | ICD-10-CM | POA: Diagnosis not present

## 2022-06-29 DIAGNOSIS — Z789 Other specified health status: Secondary | ICD-10-CM | POA: Diagnosis not present

## 2022-06-29 DIAGNOSIS — Z7409 Other reduced mobility: Secondary | ICD-10-CM | POA: Diagnosis not present

## 2022-07-04 DIAGNOSIS — M545 Low back pain, unspecified: Secondary | ICD-10-CM | POA: Diagnosis not present

## 2022-07-04 DIAGNOSIS — Z7409 Other reduced mobility: Secondary | ICD-10-CM | POA: Diagnosis not present

## 2022-07-04 DIAGNOSIS — Z789 Other specified health status: Secondary | ICD-10-CM | POA: Diagnosis not present

## 2022-07-04 DIAGNOSIS — R2681 Unsteadiness on feet: Secondary | ICD-10-CM | POA: Diagnosis not present

## 2022-07-04 DIAGNOSIS — R29898 Other symptoms and signs involving the musculoskeletal system: Secondary | ICD-10-CM | POA: Diagnosis not present

## 2022-07-04 DIAGNOSIS — M2560 Stiffness of unspecified joint, not elsewhere classified: Secondary | ICD-10-CM | POA: Diagnosis not present

## 2022-07-06 DIAGNOSIS — Z789 Other specified health status: Secondary | ICD-10-CM | POA: Diagnosis not present

## 2022-07-06 DIAGNOSIS — M545 Low back pain, unspecified: Secondary | ICD-10-CM | POA: Diagnosis not present

## 2022-07-06 DIAGNOSIS — R2681 Unsteadiness on feet: Secondary | ICD-10-CM | POA: Diagnosis not present

## 2022-07-06 DIAGNOSIS — Z7409 Other reduced mobility: Secondary | ICD-10-CM | POA: Diagnosis not present

## 2022-07-06 DIAGNOSIS — R29898 Other symptoms and signs involving the musculoskeletal system: Secondary | ICD-10-CM | POA: Diagnosis not present

## 2022-07-06 DIAGNOSIS — M2560 Stiffness of unspecified joint, not elsewhere classified: Secondary | ICD-10-CM | POA: Diagnosis not present

## 2022-07-13 DIAGNOSIS — Z681 Body mass index (BMI) 19 or less, adult: Secondary | ICD-10-CM | POA: Diagnosis not present

## 2022-07-13 DIAGNOSIS — M545 Low back pain, unspecified: Secondary | ICD-10-CM | POA: Diagnosis not present

## 2022-07-13 DIAGNOSIS — R69 Illness, unspecified: Secondary | ICD-10-CM | POA: Diagnosis not present

## 2022-07-13 DIAGNOSIS — R2681 Unsteadiness on feet: Secondary | ICD-10-CM | POA: Diagnosis not present

## 2022-07-13 DIAGNOSIS — M2560 Stiffness of unspecified joint, not elsewhere classified: Secondary | ICD-10-CM | POA: Diagnosis not present

## 2022-07-13 DIAGNOSIS — R030 Elevated blood-pressure reading, without diagnosis of hypertension: Secondary | ICD-10-CM | POA: Diagnosis not present

## 2022-07-13 DIAGNOSIS — Z8781 Personal history of (healed) traumatic fracture: Secondary | ICD-10-CM | POA: Diagnosis not present

## 2022-07-13 DIAGNOSIS — Z789 Other specified health status: Secondary | ICD-10-CM | POA: Diagnosis not present

## 2022-07-13 DIAGNOSIS — Z7409 Other reduced mobility: Secondary | ICD-10-CM | POA: Diagnosis not present

## 2022-07-13 DIAGNOSIS — R29898 Other symptoms and signs involving the musculoskeletal system: Secondary | ICD-10-CM | POA: Diagnosis not present

## 2022-07-15 DIAGNOSIS — R2681 Unsteadiness on feet: Secondary | ICD-10-CM | POA: Diagnosis not present

## 2022-07-15 DIAGNOSIS — M545 Low back pain, unspecified: Secondary | ICD-10-CM | POA: Diagnosis not present

## 2022-07-15 DIAGNOSIS — Z789 Other specified health status: Secondary | ICD-10-CM | POA: Diagnosis not present

## 2022-07-15 DIAGNOSIS — Z7409 Other reduced mobility: Secondary | ICD-10-CM | POA: Diagnosis not present

## 2022-07-15 DIAGNOSIS — M2560 Stiffness of unspecified joint, not elsewhere classified: Secondary | ICD-10-CM | POA: Diagnosis not present

## 2022-07-15 DIAGNOSIS — R29898 Other symptoms and signs involving the musculoskeletal system: Secondary | ICD-10-CM | POA: Diagnosis not present

## 2022-08-09 DIAGNOSIS — M545 Low back pain, unspecified: Secondary | ICD-10-CM | POA: Diagnosis not present

## 2022-08-19 DIAGNOSIS — M48061 Spinal stenosis, lumbar region without neurogenic claudication: Secondary | ICD-10-CM | POA: Diagnosis not present

## 2022-08-19 DIAGNOSIS — M545 Low back pain, unspecified: Secondary | ICD-10-CM | POA: Diagnosis not present

## 2022-08-19 DIAGNOSIS — Z8781 Personal history of (healed) traumatic fracture: Secondary | ICD-10-CM | POA: Diagnosis not present

## 2022-08-22 DIAGNOSIS — I2510 Atherosclerotic heart disease of native coronary artery without angina pectoris: Secondary | ICD-10-CM | POA: Diagnosis not present

## 2022-08-22 DIAGNOSIS — I700 Atherosclerosis of aorta: Secondary | ICD-10-CM | POA: Diagnosis not present

## 2022-08-22 DIAGNOSIS — R69 Illness, unspecified: Secondary | ICD-10-CM | POA: Diagnosis not present

## 2022-08-22 DIAGNOSIS — J439 Emphysema, unspecified: Secondary | ICD-10-CM | POA: Diagnosis not present

## 2022-08-23 DIAGNOSIS — M545 Low back pain, unspecified: Secondary | ICD-10-CM | POA: Diagnosis not present

## 2022-09-06 DIAGNOSIS — F17210 Nicotine dependence, cigarettes, uncomplicated: Secondary | ICD-10-CM | POA: Diagnosis not present

## 2022-09-06 DIAGNOSIS — R296 Repeated falls: Secondary | ICD-10-CM | POA: Diagnosis not present

## 2022-09-06 DIAGNOSIS — E1165 Type 2 diabetes mellitus with hyperglycemia: Secondary | ICD-10-CM | POA: Diagnosis not present

## 2022-09-06 DIAGNOSIS — R69 Illness, unspecified: Secondary | ICD-10-CM | POA: Diagnosis not present

## 2022-09-06 DIAGNOSIS — Z1331 Encounter for screening for depression: Secondary | ICD-10-CM | POA: Diagnosis not present

## 2022-09-06 DIAGNOSIS — Z1389 Encounter for screening for other disorder: Secondary | ICD-10-CM | POA: Diagnosis not present

## 2022-09-06 DIAGNOSIS — I10 Essential (primary) hypertension: Secondary | ICD-10-CM | POA: Diagnosis not present

## 2022-09-06 DIAGNOSIS — Z0001 Encounter for general adult medical examination with abnormal findings: Secondary | ICD-10-CM | POA: Diagnosis not present

## 2022-09-06 DIAGNOSIS — M545 Low back pain, unspecified: Secondary | ICD-10-CM | POA: Diagnosis not present

## 2022-09-06 DIAGNOSIS — R627 Adult failure to thrive: Secondary | ICD-10-CM | POA: Diagnosis not present

## 2022-09-06 DIAGNOSIS — D649 Anemia, unspecified: Secondary | ICD-10-CM | POA: Diagnosis not present

## 2022-09-06 DIAGNOSIS — F324 Major depressive disorder, single episode, in partial remission: Secondary | ICD-10-CM | POA: Diagnosis not present

## 2022-09-06 DIAGNOSIS — Z8673 Personal history of transient ischemic attack (TIA), and cerebral infarction without residual deficits: Secondary | ICD-10-CM | POA: Diagnosis not present

## 2022-09-06 DIAGNOSIS — R5383 Other fatigue: Secondary | ICD-10-CM | POA: Diagnosis not present

## 2022-09-06 DIAGNOSIS — Z681 Body mass index (BMI) 19 or less, adult: Secondary | ICD-10-CM | POA: Diagnosis not present

## 2022-09-06 DIAGNOSIS — I700 Atherosclerosis of aorta: Secondary | ICD-10-CM | POA: Diagnosis not present

## 2022-09-08 DIAGNOSIS — Z1329 Encounter for screening for other suspected endocrine disorder: Secondary | ICD-10-CM | POA: Diagnosis not present

## 2022-09-08 DIAGNOSIS — D529 Folate deficiency anemia, unspecified: Secondary | ICD-10-CM | POA: Diagnosis not present

## 2022-09-08 DIAGNOSIS — D649 Anemia, unspecified: Secondary | ICD-10-CM | POA: Diagnosis not present

## 2022-09-08 DIAGNOSIS — E1165 Type 2 diabetes mellitus with hyperglycemia: Secondary | ICD-10-CM | POA: Diagnosis not present

## 2022-09-08 DIAGNOSIS — D519 Vitamin B12 deficiency anemia, unspecified: Secondary | ICD-10-CM | POA: Diagnosis not present

## 2022-09-08 DIAGNOSIS — R5383 Other fatigue: Secondary | ICD-10-CM | POA: Diagnosis not present

## 2022-09-09 DIAGNOSIS — M533 Sacrococcygeal disorders, not elsewhere classified: Secondary | ICD-10-CM | POA: Diagnosis not present

## 2022-09-09 DIAGNOSIS — Z681 Body mass index (BMI) 19 or less, adult: Secondary | ICD-10-CM | POA: Diagnosis not present

## 2022-09-13 DIAGNOSIS — Z1231 Encounter for screening mammogram for malignant neoplasm of breast: Secondary | ICD-10-CM | POA: Diagnosis not present

## 2022-09-19 DIAGNOSIS — R5383 Other fatigue: Secondary | ICD-10-CM | POA: Diagnosis not present

## 2022-09-19 DIAGNOSIS — E43 Unspecified severe protein-calorie malnutrition: Secondary | ICD-10-CM | POA: Diagnosis not present

## 2022-09-19 DIAGNOSIS — G118 Other hereditary ataxias: Secondary | ICD-10-CM | POA: Insufficient documentation

## 2022-09-19 DIAGNOSIS — K9049 Malabsorption due to intolerance, not elsewhere classified: Secondary | ICD-10-CM | POA: Diagnosis not present

## 2022-09-19 DIAGNOSIS — E039 Hypothyroidism, unspecified: Secondary | ICD-10-CM | POA: Insufficient documentation

## 2022-09-19 DIAGNOSIS — R29898 Other symptoms and signs involving the musculoskeletal system: Secondary | ICD-10-CM | POA: Insufficient documentation

## 2022-09-19 DIAGNOSIS — D508 Other iron deficiency anemias: Secondary | ICD-10-CM | POA: Insufficient documentation

## 2022-09-19 DIAGNOSIS — Z794 Long term (current) use of insulin: Secondary | ICD-10-CM | POA: Diagnosis not present

## 2022-09-19 DIAGNOSIS — B3781 Candidal esophagitis: Secondary | ICD-10-CM | POA: Diagnosis not present

## 2022-09-19 DIAGNOSIS — E0865 Diabetes mellitus due to underlying condition with hyperglycemia: Secondary | ICD-10-CM | POA: Diagnosis not present

## 2022-09-19 DIAGNOSIS — M549 Dorsalgia, unspecified: Secondary | ICD-10-CM | POA: Diagnosis not present

## 2022-09-20 DIAGNOSIS — M533 Sacrococcygeal disorders, not elsewhere classified: Secondary | ICD-10-CM | POA: Diagnosis not present

## 2022-09-26 DIAGNOSIS — R059 Cough, unspecified: Secondary | ICD-10-CM | POA: Diagnosis not present

## 2022-09-26 DIAGNOSIS — D649 Anemia, unspecified: Secondary | ICD-10-CM | POA: Diagnosis not present

## 2022-09-26 DIAGNOSIS — R69 Illness, unspecified: Secondary | ICD-10-CM | POA: Diagnosis not present

## 2022-09-26 DIAGNOSIS — I10 Essential (primary) hypertension: Secondary | ICD-10-CM | POA: Diagnosis not present

## 2022-09-26 DIAGNOSIS — Z20822 Contact with and (suspected) exposure to covid-19: Secondary | ICD-10-CM | POA: Diagnosis not present

## 2022-09-26 DIAGNOSIS — Z7984 Long term (current) use of oral hypoglycemic drugs: Secondary | ICD-10-CM | POA: Diagnosis not present

## 2022-09-26 DIAGNOSIS — J069 Acute upper respiratory infection, unspecified: Secondary | ICD-10-CM | POA: Diagnosis not present

## 2022-09-26 DIAGNOSIS — E119 Type 2 diabetes mellitus without complications: Secondary | ICD-10-CM | POA: Diagnosis not present

## 2022-09-26 DIAGNOSIS — Z79899 Other long term (current) drug therapy: Secondary | ICD-10-CM | POA: Diagnosis not present

## 2022-09-26 DIAGNOSIS — Z7902 Long term (current) use of antithrombotics/antiplatelets: Secondary | ICD-10-CM | POA: Diagnosis not present

## 2022-09-26 DIAGNOSIS — R739 Hyperglycemia, unspecified: Secondary | ICD-10-CM | POA: Diagnosis not present

## 2022-09-26 DIAGNOSIS — R531 Weakness: Secondary | ICD-10-CM | POA: Diagnosis not present

## 2022-09-26 DIAGNOSIS — R5383 Other fatigue: Secondary | ICD-10-CM | POA: Diagnosis not present

## 2022-09-26 DIAGNOSIS — R0602 Shortness of breath: Secondary | ICD-10-CM | POA: Diagnosis not present

## 2022-09-26 DIAGNOSIS — Z8673 Personal history of transient ischemic attack (TIA), and cerebral infarction without residual deficits: Secondary | ICD-10-CM | POA: Diagnosis not present

## 2022-09-28 ENCOUNTER — Encounter: Payer: Self-pay | Admitting: Internal Medicine

## 2022-10-03 DIAGNOSIS — D508 Other iron deficiency anemias: Secondary | ICD-10-CM | POA: Diagnosis not present

## 2022-10-05 DIAGNOSIS — D508 Other iron deficiency anemias: Secondary | ICD-10-CM | POA: Diagnosis not present

## 2022-10-07 DIAGNOSIS — D508 Other iron deficiency anemias: Secondary | ICD-10-CM | POA: Diagnosis not present

## 2022-10-10 DIAGNOSIS — D508 Other iron deficiency anemias: Secondary | ICD-10-CM | POA: Diagnosis not present

## 2022-10-12 DIAGNOSIS — D508 Other iron deficiency anemias: Secondary | ICD-10-CM | POA: Diagnosis not present

## 2022-10-17 DIAGNOSIS — M533 Sacrococcygeal disorders, not elsewhere classified: Secondary | ICD-10-CM | POA: Diagnosis not present

## 2022-10-20 DIAGNOSIS — D649 Anemia, unspecified: Secondary | ICD-10-CM | POA: Diagnosis not present

## 2022-10-28 ENCOUNTER — Other Ambulatory Visit: Payer: Self-pay | Admitting: Internal Medicine

## 2022-10-28 DIAGNOSIS — E1165 Type 2 diabetes mellitus with hyperglycemia: Secondary | ICD-10-CM | POA: Diagnosis not present

## 2022-10-28 DIAGNOSIS — Z7984 Long term (current) use of oral hypoglycemic drugs: Secondary | ICD-10-CM | POA: Diagnosis not present

## 2022-10-28 DIAGNOSIS — J449 Chronic obstructive pulmonary disease, unspecified: Secondary | ICD-10-CM | POA: Diagnosis not present

## 2022-10-28 DIAGNOSIS — R202 Paresthesia of skin: Secondary | ICD-10-CM | POA: Diagnosis not present

## 2022-10-28 DIAGNOSIS — M79603 Pain in arm, unspecified: Secondary | ICD-10-CM | POA: Diagnosis not present

## 2022-10-28 DIAGNOSIS — I10 Essential (primary) hypertension: Secondary | ICD-10-CM | POA: Diagnosis not present

## 2022-10-28 DIAGNOSIS — R69 Illness, unspecified: Secondary | ICD-10-CM | POA: Diagnosis not present

## 2022-10-28 DIAGNOSIS — Z720 Tobacco use: Secondary | ICD-10-CM | POA: Diagnosis not present

## 2022-10-28 DIAGNOSIS — Z7902 Long term (current) use of antithrombotics/antiplatelets: Secondary | ICD-10-CM | POA: Diagnosis not present

## 2022-10-28 DIAGNOSIS — R531 Weakness: Secondary | ICD-10-CM | POA: Diagnosis not present

## 2022-10-28 DIAGNOSIS — R5383 Other fatigue: Secondary | ICD-10-CM | POA: Diagnosis not present

## 2022-10-28 DIAGNOSIS — R110 Nausea: Secondary | ICD-10-CM | POA: Diagnosis not present

## 2022-10-29 NOTE — Progress Notes (Deleted)
Referring Provider: [facility] Primary Care Physician:  Royann Shivers, PA-C Primary GI Physician: Dr. Marletta Lor  No chief complaint on file.   HPI:   Ashlee Austin is a 67 y.o. female presenting today at the request of [facility] for persistent iron deficiency anemia and malnutrition, consult EGD and capsule enteroscopy.  Hemoglobin 9.2 01/08/2022, previously 11.6 in July 2022.  03/11/2022, ferritin 42, 30, iron saturation 10% (L).  Hemoglobin declined as low as 7.9 in August 2023 during hospitalization.  Suspect hemoglobin decline was likely dilutional.  She was on oral iron twice daily.  On 09/26/2022, hemoglobin 9.2, iron panel low normal.  Hemoglobin down to 8.7 on 09/26/2022.  Patient was referred by PCP to [facility] hematology/oncology for iron deficiency anemia.  She saw them in consult on 09/19/2022.  They have started IV iron (received 5 infusions with last on 10/12/2022) and recommended seeing GI for repeat EGD and consideration of capsule endoscopy.    Most recent labs 10/28/2022 with hemoglobin 10.9. Glucose was elevated at 461.    Today:      Colonoscopy 10/20/2020: Nonbleeding internal hemorrhoids, diverticulosis in the sigmoid colon, 3-4-7 mm polyp in the transverse colon resected and retrieved.  Pathology with 1 tubular adenoma and benign colonic mucosa.  Recommended 5-year surveillance.  EGD 10/20/2020: Nonsevere suspected Candida esophagitis with cells for cytology obtained, gastritis biopsied.  Gastric biopsy negative for H. pylori.  KOH with yeast.  She was prescribed Diflucan.  Past Medical History:  Diagnosis Date   Carotid artery disease (HCC)    COPD (chronic obstructive pulmonary disease) (HCC)    Coronary atherosclerosis of native coronary artery    [facility]; negative Dobutamine echo 6/11   Depression    Essential hypertension    Hyperlipidemia    Hypothyroidism    Stroke (HCC) 2020   Stroke [facility]) 2014   Type 2  diabetes mellitus (HCC)     Past Surgical History:  Procedure Laterality Date   BIOPSY  10/20/2020   Procedure: BIOPSY;  Surgeon: Lanelle Bal, DO;  Location: [facility];  Service: Endoscopy;;   CARPAL TUNNEL RELEASE     COLONOSCOPY WITH PROPOFOL N/A 10/20/2020   Procedure: COLONOSCOPY WITH PROPOFOL;  Surgeon: Lanelle Bal, DO;  Location: [facility];  Service: Endoscopy;  Laterality: N/A;  pm (early as possible), diabetic   CYSTECTOMY     Spine   ESOPHAGOGASTRODUODENOSCOPY (EGD) WITH PROPOFOL N/A 10/20/2020   Procedure: ESOPHAGOGASTRODUODENOSCOPY (EGD) WITH PROPOFOL;  Surgeon: Lanelle Bal, DO;  Location: [facility];  Service: Endoscopy;  Laterality: N/A;   TOTAL ABDOMINAL HYSTERECTOMY  2004   TYMPANOPLASTY      Current Outpatient Medications  Medication Sig Dispense Refill   ALPRAZolam (XANAX) 1 MG tablet Take 1 mg by mouth 2 (two) times daily.     amLODipine (NORVASC) 5 MG tablet Take 5 mg by mouth daily.     clopidogrel (PLAVIX) 75 MG tablet Take 75 mg by mouth daily.     Cyanocobalamin (VITAMIN B12 PO) Take 1 tablet by mouth daily.     escitalopram (LEXAPRO) 10 MG tablet Take 10 mg by mouth daily.     feeding supplement (ENSURE ENLIVE / ENSURE PLUS) LIQD Take 237 mLs by mouth 3 (three) times daily between meals. 237 mL 12   FEROSUL 325 (65 Fe) MG tablet Take 325 mg by mouth 2 (two) times daily with a meal.     metFORMIN (  GLUCOPHAGE) 1000 MG tablet Take 1,000 mg by mouth 2 (two) times daily with a meal.     Multiple Vitamin (MULTIVITAMIN WITH MINERALS) TABS tablet Take 1 tablet by mouth daily.     pantoprazole (PROTONIX) 40 MG tablet TAKE 1 TABLET(40 MG) BY MOUTH TWICE DAILY (Patient taking differently: Take 40 mg by mouth 2 (two) times daily.) 60 tablet 5   Probiotic Product (PROBIOTIC PO) Take 1 tablet by mouth daily.     sitaGLIPtin (JANUVIA) 50 MG tablet Take 50 mg by mouth daily.     No current facility-administered medications for this visit.     Allergies as of 10/31/2022   (No Known Allergies)    Family History  Problem Relation Age of Onset   Coronary artery disease Mother        MI age 58   Lung cancer Father     Social History   Socioeconomic History   Marital status: Widowed    Spouse name: Not on file   Number of children: Not on file   Years of education: Not on file   Highest education level: Not on file  Occupational History    Employer: [employer]    Comment: Full time at [employer]  Tobacco Use   Smoking status: Every Day    Packs/day: 1.00    Years: 16.00    Additional pack years: 0.00    Total pack years: 16.00    Types: Cigarettes    Start date: 01/31/1972    Last attempt to quit: 10/17/2019    Years since quitting: 3.0   Smokeless tobacco: Never  Substance and Sexual Activity   Alcohol use: No   Drug use: No   Sexual activity: Not on file  Other Topics Concern   Not on file  Social History Narrative   Not on file   Social Determinants of Health   Financial Resource Strain: Not on file  Food Insecurity: Not on file  Transportation Needs: Not on file  Physical Activity: Not on file  Stress: Not on file  Social Connections: Not on file    Review of Systems: Gen: Denies fever, chills, anorexia. Denies fatigue, weakness, weight loss.  CV: Denies chest pain, palpitations, syncope, peripheral edema, and claudication. Resp: Denies dyspnea at rest, cough, wheezing, coughing up blood, and pleurisy. GI: Denies vomiting blood, jaundice, and fecal incontinence.   Denies dysphagia or odynophagia. Derm: Denies rash, itching, dry skin Psych: Denies depression, anxiety, memory loss, confusion. No homicidal or suicidal ideation.  Heme: Denies bruising, bleeding, and enlarged lymph nodes.  Physical Exam: There were no vitals taken for this visit. General:   Alert and oriented. No distress noted. Pleasant and cooperative.  Head:  Normocephalic and atraumatic. Eyes:  Conjuctiva clear  without scleral icterus. Heart:  S1, S2 present without murmurs appreciated. Lungs:  Clear to auscultation bilaterally. No wheezes, rales, or rhonchi. No distress.  Abdomen:  +BS, soft, non-tender and non-distended. No rebound or guarding. No HSM or masses noted. Msk:  Symmetrical without gross deformities. Normal posture. Extremities:  Without edema. Neurologic:  Alert and  oriented x4 Psych:  Normal mood and affect.    Assessment:     Plan:  ***   Ermalinda Memos, PA-C [facility] 10/31/2022

## 2022-10-31 ENCOUNTER — Encounter: Payer: Medicare HMO | Admitting: Gastroenterology

## 2022-10-31 DIAGNOSIS — I10 Essential (primary) hypertension: Secondary | ICD-10-CM | POA: Diagnosis not present

## 2022-10-31 DIAGNOSIS — E1165 Type 2 diabetes mellitus with hyperglycemia: Secondary | ICD-10-CM | POA: Diagnosis not present

## 2022-10-31 DIAGNOSIS — Z8673 Personal history of transient ischemic attack (TIA), and cerebral infarction without residual deficits: Secondary | ICD-10-CM | POA: Diagnosis not present

## 2022-10-31 DIAGNOSIS — I700 Atherosclerosis of aorta: Secondary | ICD-10-CM | POA: Diagnosis not present

## 2022-10-31 DIAGNOSIS — R7989 Other specified abnormal findings of blood chemistry: Secondary | ICD-10-CM | POA: Diagnosis not present

## 2022-10-31 DIAGNOSIS — R69 Illness, unspecified: Secondary | ICD-10-CM | POA: Diagnosis not present

## 2022-10-31 DIAGNOSIS — Z681 Body mass index (BMI) 19 or less, adult: Secondary | ICD-10-CM | POA: Diagnosis not present

## 2022-10-31 DIAGNOSIS — F17210 Nicotine dependence, cigarettes, uncomplicated: Secondary | ICD-10-CM | POA: Diagnosis not present

## 2022-10-31 DIAGNOSIS — R627 Adult failure to thrive: Secondary | ICD-10-CM | POA: Diagnosis not present

## 2022-10-31 DIAGNOSIS — R296 Repeated falls: Secondary | ICD-10-CM | POA: Diagnosis not present

## 2022-10-31 DIAGNOSIS — D649 Anemia, unspecified: Secondary | ICD-10-CM | POA: Diagnosis not present

## 2022-10-31 NOTE — Telephone Encounter (Signed)
Refilled. Needs office visit.

## 2022-11-02 DIAGNOSIS — M47816 Spondylosis without myelopathy or radiculopathy, lumbar region: Secondary | ICD-10-CM | POA: Diagnosis not present

## 2022-11-02 DIAGNOSIS — Z681 Body mass index (BMI) 19 or less, adult: Secondary | ICD-10-CM | POA: Diagnosis not present

## 2022-11-06 NOTE — Progress Notes (Deleted)
Referring Provider: [facility] Primary Care Physician:  Sheela Stack Primary Gastroenterologist:  Dr. Marletta Lor  No chief complaint on file.   HPI:   Ashlee Austin is a 67 y.o. female presenting today at the request of [facility] cancer center for persistent iron deficiency anemia and malnutrition, consult EGD and capsule enteroscopy.   Hemoglobin 9.2 01/08/2022, previously 11.6 in July 2022.  03/11/2022, ferritin 42, 30, iron saturation 10% (L).  Hemoglobin declined as low as 7.9 in August 2023 during hospitalization.  Suspect hemoglobin decline was likely dilutional.  She was on oral iron twice daily.  On 09/26/2022, hemoglobin 9.2, iron panel low normal.  Hemoglobin down to 8.7 on 09/26/2022.   Patient was referred by PCP to [facility] hematology/oncology for iron deficiency anemia.  She saw them in consult on 09/19/2022.  They have started IV iron (received 5 infusions with last on 10/12/2022) and recommended seeing GI for repeat EGD and consideration of capsule endoscopy.      Most recent labs 10/28/2022 with hemoglobin 10.9. Glucose was elevated at 461.      Today:           Colonoscopy 10/20/2020: Nonbleeding internal hemorrhoids, diverticulosis in the sigmoid colon, 3-4-7 mm polyp in the transverse colon resected and retrieved.  Pathology with 1 tubular adenoma and benign colonic mucosa.  Recommended 5-year surveillance.   EGD 10/20/2020: Nonsevere suspected Candida esophagitis with cells for cytology obtained, gastritis biopsied.  Gastric biopsy negative for H. pylori.  KOH with yeast.  She was prescribed Diflucan.    Past Medical History:  Diagnosis Date   Carotid artery disease (HCC)    COPD (chronic obstructive pulmonary disease) (HCC)    Coronary atherosclerosis of native coronary artery    [facility]; negative Dobutamine echo 6/11   Depression    Essential hypertension    Hyperlipidemia    Hypothyroidism    Stroke (HCC) 2020   Stroke [facility])  2014   Type 2 diabetes mellitus (HCC)     Past Surgical History:  Procedure Laterality Date   BIOPSY  10/20/2020   Procedure: BIOPSY;  Surgeon: Lanelle Bal, DO;  [facility];  Service: Endoscopy;;   CARPAL TUNNEL RELEASE     COLONOSCOPY WITH PROPOFOL N/A 10/20/2020   Procedure: COLONOSCOPY WITH PROPOFOL;  Surgeon: Lanelle Bal, DO;  [facility];  Service: Endoscopy;  Laterality: N/A;  pm (early as possible), diabetic   CYSTECTOMY     Spine   ESOPHAGOGASTRODUODENOSCOPY (EGD) WITH PROPOFOL N/A 10/20/2020   Procedure: ESOPHAGOGASTRODUODENOSCOPY (EGD) WITH PROPOFOL;  Surgeon: Lanelle Bal, DO;  [facility];  Service: Endoscopy;  Laterality: N/A;   TOTAL ABDOMINAL HYSTERECTOMY  2004   TYMPANOPLASTY      Current Outpatient Medications  Medication Sig Dispense Refill   ALPRAZolam (XANAX) 1 MG tablet Take 1 mg by mouth 2 (two) times daily.     amLODipine (NORVASC) 5 MG tablet Take 5 mg by mouth daily.     clopidogrel (PLAVIX) 75 MG tablet Take 75 mg by mouth daily.     Cyanocobalamin (VITAMIN B12 PO) Take 1 tablet by mouth daily.     escitalopram (LEXAPRO) 10 MG tablet Take 10 mg by mouth daily.     feeding supplement (ENSURE ENLIVE / ENSURE PLUS) LIQD Take 237 mLs by mouth 3 (three) times daily between meals. 237 mL 12   FEROSUL 325 (65 Fe) MG tablet Take 325 mg by  mouth 2 (two) times daily with a meal.     metFORMIN (GLUCOPHAGE) 1000 MG tablet Take 1,000 mg by mouth 2 (two) times daily with a meal.     Multiple Vitamin (MULTIVITAMIN WITH MINERALS) TABS tablet Take 1 tablet by mouth daily.     pantoprazole (PROTONIX) 40 MG tablet TAKE 1 TABLET(40 MG) BY MOUTH TWICE DAILY 60 tablet 5   Probiotic Product (PROBIOTIC PO) Take 1 tablet by mouth daily.     sitaGLIPtin (JANUVIA) 50 MG tablet Take 50 mg by mouth daily.     No current facility-administered medications for this visit.    Allergies as of 11/09/2022   (No Known Allergies)    Family  History  Problem Relation Age of Onset   Coronary artery disease Mother        MI age 12   Lung cancer Father     Social History   Socioeconomic History   Marital status: Widowed    Spouse name: Not on file   Number of children: Not on file   Years of education: Not on file   Highest education level: Not on file  Occupational History    Employer: [employer]    Comment: Full time at [employer]  Tobacco Use   Smoking status: Every Day    Packs/day: 1.00    Years: 16.00    Additional pack years: 0.00    Total pack years: 16.00    Types: Cigarettes    Start date: 01/31/1972    Last attempt to quit: 10/17/2019    Years since quitting: 3.0   Smokeless tobacco: Never  Substance and Sexual Activity   Alcohol use: No   Drug use: No   Sexual activity: Not on file  Other Topics Concern   Not on file  Social History Narrative   Not on file   Social Determinants of Health   Financial Resource Strain: Not on file  Food Insecurity: Not on file  Transportation Needs: Not on file  Physical Activity: Not on file  Stress: Not on file  Social Connections: Not on file  Intimate Partner Violence: Not on file    Review of Systems: Gen: Denies any fever, chills, fatigue, weight loss, lack of appetite.  CV: Denies chest pain, heart palpitations, peripheral edema, syncope.  Resp: Denies shortness of breath at rest or with exertion. Denies wheezing or cough.  GI: Denies dysphagia or odynophagia. Denies jaundice, hematemesis, fecal incontinence. GU : Denies urinary burning, urinary frequency, urinary hesitancy MS: Denies joint pain, muscle weakness, cramps, or limitation of movement.  Derm: Denies rash, itching, dry skin Psych: Denies depression, anxiety, memory loss, and confusion Heme: Denies bruising, bleeding, and enlarged lymph nodes.  Physical Exam: There were no vitals taken for this visit. General:   Alert and oriented. Pleasant and cooperative. Well-nourished and  well-developed.  Head:  Normocephalic and atraumatic. Eyes:  Without icterus, sclera clear and conjunctiva pink.  Ears:  Normal auditory acuity. Lungs:  Clear to auscultation bilaterally. No wheezes, rales, or rhonchi. No distress.  Heart:  S1, S2 present without murmurs appreciated.  Abdomen:  +BS, soft, non-tender and non-distended. No HSM noted. No guarding or rebound. No masses appreciated.  Rectal:  Deferred  Msk:  Symmetrical without gross deformities. Normal posture. Extremities:  Without edema. Neurologic:  Alert and  oriented x4;  grossly normal neurologically. Skin:  Intact without significant lesions or rashes. Psych:  Alert and cooperative. Normal mood and affect.    Assessment:  Plan:  ***   Ermalinda Memos, PA-C [facility] 11/09/2022

## 2022-11-09 ENCOUNTER — Ambulatory Visit: Payer: Medicare HMO | Admitting: Gastroenterology

## 2022-11-24 DIAGNOSIS — I10 Essential (primary) hypertension: Secondary | ICD-10-CM | POA: Diagnosis not present

## 2022-11-24 DIAGNOSIS — S300XXA Contusion of lower back and pelvis, initial encounter: Secondary | ICD-10-CM | POA: Diagnosis not present

## 2022-11-24 DIAGNOSIS — R030 Elevated blood-pressure reading, without diagnosis of hypertension: Secondary | ICD-10-CM | POA: Diagnosis not present

## 2022-11-24 DIAGNOSIS — M545 Low back pain, unspecified: Secondary | ICD-10-CM | POA: Diagnosis not present

## 2022-11-24 DIAGNOSIS — Z8673 Personal history of transient ischemic attack (TIA), and cerebral infarction without residual deficits: Secondary | ICD-10-CM | POA: Diagnosis not present

## 2022-11-24 DIAGNOSIS — F17210 Nicotine dependence, cigarettes, uncomplicated: Secondary | ICD-10-CM | POA: Diagnosis not present

## 2022-11-24 DIAGNOSIS — E119 Type 2 diabetes mellitus without complications: Secondary | ICD-10-CM | POA: Diagnosis not present

## 2022-11-24 DIAGNOSIS — N189 Chronic kidney disease, unspecified: Secondary | ICD-10-CM | POA: Diagnosis not present

## 2022-11-24 DIAGNOSIS — E860 Dehydration: Secondary | ICD-10-CM | POA: Diagnosis not present

## 2022-11-24 DIAGNOSIS — I6381 Other cerebral infarction due to occlusion or stenosis of small artery: Secondary | ICD-10-CM | POA: Diagnosis not present

## 2022-11-24 DIAGNOSIS — E039 Hypothyroidism, unspecified: Secondary | ICD-10-CM | POA: Diagnosis not present

## 2022-11-24 DIAGNOSIS — R296 Repeated falls: Secondary | ICD-10-CM | POA: Diagnosis not present

## 2022-11-24 DIAGNOSIS — R55 Syncope and collapse: Secondary | ICD-10-CM | POA: Diagnosis not present

## 2022-11-24 DIAGNOSIS — E861 Hypovolemia: Secondary | ICD-10-CM | POA: Diagnosis not present

## 2022-11-24 DIAGNOSIS — E785 Hyperlipidemia, unspecified: Secondary | ICD-10-CM | POA: Diagnosis not present

## 2022-11-24 DIAGNOSIS — N179 Acute kidney failure, unspecified: Secondary | ICD-10-CM | POA: Diagnosis not present

## 2022-11-24 DIAGNOSIS — F324 Major depressive disorder, single episode, in partial remission: Secondary | ICD-10-CM | POA: Diagnosis not present

## 2022-11-24 DIAGNOSIS — Z681 Body mass index (BMI) 19 or less, adult: Secondary | ICD-10-CM | POA: Diagnosis not present

## 2022-11-25 DIAGNOSIS — E119 Type 2 diabetes mellitus without complications: Secondary | ICD-10-CM | POA: Diagnosis not present

## 2022-11-25 DIAGNOSIS — R911 Solitary pulmonary nodule: Secondary | ICD-10-CM | POA: Diagnosis not present

## 2022-11-25 DIAGNOSIS — E1169 Type 2 diabetes mellitus with other specified complication: Secondary | ICD-10-CM | POA: Diagnosis not present

## 2022-11-25 DIAGNOSIS — E876 Hypokalemia: Secondary | ICD-10-CM | POA: Diagnosis not present

## 2022-11-25 DIAGNOSIS — N179 Acute kidney failure, unspecified: Secondary | ICD-10-CM | POA: Insufficient documentation

## 2022-11-25 DIAGNOSIS — F17210 Nicotine dependence, cigarettes, uncomplicated: Secondary | ICD-10-CM | POA: Diagnosis not present

## 2022-11-25 DIAGNOSIS — E1142 Type 2 diabetes mellitus with diabetic polyneuropathy: Secondary | ICD-10-CM | POA: Diagnosis not present

## 2022-11-25 DIAGNOSIS — I5032 Chronic diastolic (congestive) heart failure: Secondary | ICD-10-CM | POA: Diagnosis not present

## 2022-11-25 DIAGNOSIS — J9809 Other diseases of bronchus, not elsewhere classified: Secondary | ICD-10-CM | POA: Diagnosis not present

## 2022-11-25 DIAGNOSIS — E039 Hypothyroidism, unspecified: Secondary | ICD-10-CM | POA: Diagnosis not present

## 2022-11-25 DIAGNOSIS — Z7984 Long term (current) use of oral hypoglycemic drugs: Secondary | ICD-10-CM | POA: Diagnosis not present

## 2022-11-25 DIAGNOSIS — J439 Emphysema, unspecified: Secondary | ICD-10-CM | POA: Diagnosis not present

## 2022-11-25 DIAGNOSIS — E875 Hyperkalemia: Secondary | ICD-10-CM | POA: Diagnosis not present

## 2022-11-25 DIAGNOSIS — I6381 Other cerebral infarction due to occlusion or stenosis of small artery: Secondary | ICD-10-CM | POA: Diagnosis not present

## 2022-11-25 DIAGNOSIS — E861 Hypovolemia: Secondary | ICD-10-CM | POA: Diagnosis not present

## 2022-11-25 DIAGNOSIS — Z79899 Other long term (current) drug therapy: Secondary | ICD-10-CM | POA: Diagnosis not present

## 2022-11-25 DIAGNOSIS — I5022 Chronic systolic (congestive) heart failure: Secondary | ICD-10-CM | POA: Insufficient documentation

## 2022-11-25 DIAGNOSIS — I110 Hypertensive heart disease with heart failure: Secondary | ICD-10-CM | POA: Diagnosis not present

## 2022-11-25 DIAGNOSIS — E860 Dehydration: Secondary | ICD-10-CM | POA: Diagnosis not present

## 2022-11-25 DIAGNOSIS — Z8673 Personal history of transient ischemic attack (TIA), and cerebral infarction without residual deficits: Secondary | ICD-10-CM | POA: Diagnosis not present

## 2022-11-25 DIAGNOSIS — I5042 Chronic combined systolic (congestive) and diastolic (congestive) heart failure: Secondary | ICD-10-CM | POA: Diagnosis not present

## 2022-11-25 DIAGNOSIS — R627 Adult failure to thrive: Secondary | ICD-10-CM | POA: Diagnosis not present

## 2022-11-25 DIAGNOSIS — Z681 Body mass index (BMI) 19 or less, adult: Secondary | ICD-10-CM | POA: Diagnosis not present

## 2022-11-25 DIAGNOSIS — Z66 Do not resuscitate: Secondary | ICD-10-CM | POA: Diagnosis not present

## 2022-11-25 DIAGNOSIS — E785 Hyperlipidemia, unspecified: Secondary | ICD-10-CM | POA: Diagnosis not present

## 2022-11-25 DIAGNOSIS — I130 Hypertensive heart and chronic kidney disease with heart failure and stage 1 through stage 4 chronic kidney disease, or unspecified chronic kidney disease: Secondary | ICD-10-CM | POA: Diagnosis not present

## 2022-11-25 DIAGNOSIS — N200 Calculus of kidney: Secondary | ICD-10-CM | POA: Diagnosis not present

## 2022-11-25 DIAGNOSIS — Z720 Tobacco use: Secondary | ICD-10-CM | POA: Diagnosis not present

## 2022-11-25 DIAGNOSIS — Z7902 Long term (current) use of antithrombotics/antiplatelets: Secondary | ICD-10-CM | POA: Diagnosis not present

## 2022-11-25 DIAGNOSIS — Z90710 Acquired absence of both cervix and uterus: Secondary | ICD-10-CM | POA: Diagnosis not present

## 2022-11-25 DIAGNOSIS — R296 Repeated falls: Secondary | ICD-10-CM | POA: Diagnosis not present

## 2022-11-25 DIAGNOSIS — I517 Cardiomegaly: Secondary | ICD-10-CM | POA: Diagnosis not present

## 2022-11-25 DIAGNOSIS — E878 Other disorders of electrolyte and fluid balance, not elsewhere classified: Secondary | ICD-10-CM | POA: Diagnosis not present

## 2022-11-25 DIAGNOSIS — F324 Major depressive disorder, single episode, in partial remission: Secondary | ICD-10-CM | POA: Diagnosis not present

## 2022-11-25 DIAGNOSIS — N1832 Chronic kidney disease, stage 3b: Secondary | ICD-10-CM | POA: Diagnosis not present

## 2022-11-25 DIAGNOSIS — Z90721 Acquired absence of ovaries, unilateral: Secondary | ICD-10-CM | POA: Diagnosis not present

## 2022-11-25 DIAGNOSIS — E871 Hypo-osmolality and hyponatremia: Secondary | ICD-10-CM | POA: Diagnosis not present

## 2022-11-25 DIAGNOSIS — Z0389 Encounter for observation for other suspected diseases and conditions ruled out: Secondary | ICD-10-CM | POA: Diagnosis not present

## 2022-11-25 DIAGNOSIS — E1165 Type 2 diabetes mellitus with hyperglycemia: Secondary | ICD-10-CM | POA: Diagnosis not present

## 2022-11-26 DIAGNOSIS — Z720 Tobacco use: Secondary | ICD-10-CM | POA: Diagnosis not present

## 2022-11-26 DIAGNOSIS — E878 Other disorders of electrolyte and fluid balance, not elsewhere classified: Secondary | ICD-10-CM | POA: Diagnosis not present

## 2022-11-26 DIAGNOSIS — E1169 Type 2 diabetes mellitus with other specified complication: Secondary | ICD-10-CM | POA: Diagnosis not present

## 2022-11-26 DIAGNOSIS — Z8673 Personal history of transient ischemic attack (TIA), and cerebral infarction without residual deficits: Secondary | ICD-10-CM | POA: Diagnosis not present

## 2022-11-26 DIAGNOSIS — I5032 Chronic diastolic (congestive) heart failure: Secondary | ICD-10-CM | POA: Diagnosis not present

## 2022-11-26 DIAGNOSIS — E785 Hyperlipidemia, unspecified: Secondary | ICD-10-CM | POA: Diagnosis not present

## 2022-11-26 DIAGNOSIS — R296 Repeated falls: Secondary | ICD-10-CM | POA: Diagnosis not present

## 2022-11-26 DIAGNOSIS — J439 Emphysema, unspecified: Secondary | ICD-10-CM | POA: Diagnosis not present

## 2022-11-26 DIAGNOSIS — E039 Hypothyroidism, unspecified: Secondary | ICD-10-CM | POA: Diagnosis not present

## 2022-11-26 DIAGNOSIS — N200 Calculus of kidney: Secondary | ICD-10-CM | POA: Diagnosis not present

## 2022-11-26 DIAGNOSIS — E1165 Type 2 diabetes mellitus with hyperglycemia: Secondary | ICD-10-CM | POA: Diagnosis not present

## 2022-11-26 DIAGNOSIS — N179 Acute kidney failure, unspecified: Secondary | ICD-10-CM | POA: Diagnosis not present

## 2022-11-26 DIAGNOSIS — J9809 Other diseases of bronchus, not elsewhere classified: Secondary | ICD-10-CM | POA: Diagnosis not present

## 2022-11-26 DIAGNOSIS — Z681 Body mass index (BMI) 19 or less, adult: Secondary | ICD-10-CM | POA: Diagnosis not present

## 2022-11-26 DIAGNOSIS — I110 Hypertensive heart disease with heart failure: Secondary | ICD-10-CM | POA: Diagnosis not present

## 2022-11-26 DIAGNOSIS — R911 Solitary pulmonary nodule: Secondary | ICD-10-CM | POA: Diagnosis not present

## 2022-11-27 DIAGNOSIS — N179 Acute kidney failure, unspecified: Secondary | ICD-10-CM | POA: Diagnosis not present

## 2022-11-27 DIAGNOSIS — Z720 Tobacco use: Secondary | ICD-10-CM | POA: Diagnosis not present

## 2022-11-27 DIAGNOSIS — E119 Type 2 diabetes mellitus without complications: Secondary | ICD-10-CM | POA: Diagnosis not present

## 2022-11-27 DIAGNOSIS — E785 Hyperlipidemia, unspecified: Secondary | ICD-10-CM | POA: Diagnosis not present

## 2022-11-27 DIAGNOSIS — Z8673 Personal history of transient ischemic attack (TIA), and cerebral infarction without residual deficits: Secondary | ICD-10-CM | POA: Diagnosis not present

## 2022-11-27 DIAGNOSIS — I110 Hypertensive heart disease with heart failure: Secondary | ICD-10-CM | POA: Diagnosis not present

## 2022-11-27 DIAGNOSIS — R296 Repeated falls: Secondary | ICD-10-CM | POA: Diagnosis not present

## 2022-11-27 DIAGNOSIS — R627 Adult failure to thrive: Secondary | ICD-10-CM | POA: Diagnosis not present

## 2022-11-27 DIAGNOSIS — E878 Other disorders of electrolyte and fluid balance, not elsewhere classified: Secondary | ICD-10-CM | POA: Diagnosis not present

## 2022-11-27 DIAGNOSIS — Z681 Body mass index (BMI) 19 or less, adult: Secondary | ICD-10-CM | POA: Diagnosis not present

## 2022-11-27 DIAGNOSIS — I5042 Chronic combined systolic (congestive) and diastolic (congestive) heart failure: Secondary | ICD-10-CM | POA: Diagnosis not present

## 2022-11-27 DIAGNOSIS — E039 Hypothyroidism, unspecified: Secondary | ICD-10-CM | POA: Diagnosis not present

## 2022-11-28 DIAGNOSIS — F17210 Nicotine dependence, cigarettes, uncomplicated: Secondary | ICD-10-CM | POA: Diagnosis not present

## 2022-11-28 DIAGNOSIS — R911 Solitary pulmonary nodule: Secondary | ICD-10-CM | POA: Insufficient documentation

## 2022-11-28 DIAGNOSIS — E785 Hyperlipidemia, unspecified: Secondary | ICD-10-CM | POA: Diagnosis not present

## 2022-11-28 DIAGNOSIS — N1832 Chronic kidney disease, stage 3b: Secondary | ICD-10-CM | POA: Diagnosis not present

## 2022-11-28 DIAGNOSIS — E43 Unspecified severe protein-calorie malnutrition: Secondary | ICD-10-CM | POA: Diagnosis not present

## 2022-11-28 DIAGNOSIS — I5042 Chronic combined systolic (congestive) and diastolic (congestive) heart failure: Secondary | ICD-10-CM | POA: Diagnosis not present

## 2022-11-28 DIAGNOSIS — E1122 Type 2 diabetes mellitus with diabetic chronic kidney disease: Secondary | ICD-10-CM | POA: Diagnosis not present

## 2022-11-28 DIAGNOSIS — N179 Acute kidney failure, unspecified: Secondary | ICD-10-CM | POA: Diagnosis not present

## 2022-11-28 DIAGNOSIS — F324 Major depressive disorder, single episode, in partial remission: Secondary | ICD-10-CM | POA: Diagnosis not present

## 2022-11-28 DIAGNOSIS — R296 Repeated falls: Secondary | ICD-10-CM | POA: Diagnosis not present

## 2022-11-28 DIAGNOSIS — I130 Hypertensive heart and chronic kidney disease with heart failure and stage 1 through stage 4 chronic kidney disease, or unspecified chronic kidney disease: Secondary | ICD-10-CM | POA: Diagnosis not present

## 2022-11-28 DIAGNOSIS — E039 Hypothyroidism, unspecified: Secondary | ICD-10-CM | POA: Diagnosis not present

## 2022-12-05 DIAGNOSIS — R6251 Failure to thrive (child): Secondary | ICD-10-CM | POA: Diagnosis not present

## 2022-12-05 DIAGNOSIS — E43 Unspecified severe protein-calorie malnutrition: Secondary | ICD-10-CM | POA: Diagnosis not present

## 2022-12-05 DIAGNOSIS — N1832 Chronic kidney disease, stage 3b: Secondary | ICD-10-CM | POA: Diagnosis not present

## 2022-12-05 DIAGNOSIS — I69344 Monoplegia of lower limb following cerebral infarction affecting left non-dominant side: Secondary | ICD-10-CM | POA: Diagnosis not present

## 2022-12-05 DIAGNOSIS — M549 Dorsalgia, unspecified: Secondary | ICD-10-CM | POA: Diagnosis not present

## 2022-12-05 DIAGNOSIS — F17200 Nicotine dependence, unspecified, uncomplicated: Secondary | ICD-10-CM | POA: Diagnosis not present

## 2022-12-05 DIAGNOSIS — I951 Orthostatic hypotension: Secondary | ICD-10-CM | POA: Diagnosis not present

## 2022-12-05 DIAGNOSIS — M79605 Pain in left leg: Secondary | ICD-10-CM | POA: Diagnosis not present

## 2022-12-05 DIAGNOSIS — M79604 Pain in right leg: Secondary | ICD-10-CM | POA: Diagnosis not present

## 2022-12-05 DIAGNOSIS — I5042 Chronic combined systolic (congestive) and diastolic (congestive) heart failure: Secondary | ICD-10-CM | POA: Diagnosis not present

## 2022-12-05 DIAGNOSIS — E1122 Type 2 diabetes mellitus with diabetic chronic kidney disease: Secondary | ICD-10-CM | POA: Diagnosis not present

## 2022-12-05 DIAGNOSIS — R296 Repeated falls: Secondary | ICD-10-CM | POA: Diagnosis not present

## 2022-12-05 DIAGNOSIS — R911 Solitary pulmonary nodule: Secondary | ICD-10-CM | POA: Diagnosis not present

## 2022-12-05 DIAGNOSIS — N179 Acute kidney failure, unspecified: Secondary | ICD-10-CM | POA: Diagnosis not present

## 2022-12-05 DIAGNOSIS — E785 Hyperlipidemia, unspecified: Secondary | ICD-10-CM | POA: Diagnosis not present

## 2022-12-05 DIAGNOSIS — G8929 Other chronic pain: Secondary | ICD-10-CM | POA: Diagnosis not present

## 2022-12-05 DIAGNOSIS — E039 Hypothyroidism, unspecified: Secondary | ICD-10-CM | POA: Diagnosis not present

## 2022-12-05 DIAGNOSIS — Z681 Body mass index (BMI) 19 or less, adult: Secondary | ICD-10-CM | POA: Diagnosis not present

## 2022-12-05 DIAGNOSIS — E871 Hypo-osmolality and hyponatremia: Secondary | ICD-10-CM | POA: Diagnosis not present

## 2022-12-05 DIAGNOSIS — I130 Hypertensive heart and chronic kidney disease with heart failure and stage 1 through stage 4 chronic kidney disease, or unspecified chronic kidney disease: Secondary | ICD-10-CM | POA: Diagnosis not present

## 2022-12-05 DIAGNOSIS — J439 Emphysema, unspecified: Secondary | ICD-10-CM | POA: Diagnosis not present

## 2022-12-05 DIAGNOSIS — E1140 Type 2 diabetes mellitus with diabetic neuropathy, unspecified: Secondary | ICD-10-CM | POA: Diagnosis not present

## 2022-12-05 DIAGNOSIS — F324 Major depressive disorder, single episode, in partial remission: Secondary | ICD-10-CM | POA: Diagnosis not present

## 2022-12-05 DIAGNOSIS — E875 Hyperkalemia: Secondary | ICD-10-CM | POA: Diagnosis not present

## 2022-12-17 DIAGNOSIS — L03031 Cellulitis of right toe: Secondary | ICD-10-CM | POA: Diagnosis not present

## 2022-12-31 DIAGNOSIS — G894 Chronic pain syndrome: Secondary | ICD-10-CM | POA: Diagnosis not present

## 2022-12-31 DIAGNOSIS — M545 Low back pain, unspecified: Secondary | ICD-10-CM | POA: Diagnosis not present

## 2023-01-05 DIAGNOSIS — I700 Atherosclerosis of aorta: Secondary | ICD-10-CM | POA: Diagnosis not present

## 2023-01-05 DIAGNOSIS — R296 Repeated falls: Secondary | ICD-10-CM | POA: Diagnosis not present

## 2023-01-05 DIAGNOSIS — E1165 Type 2 diabetes mellitus with hyperglycemia: Secondary | ICD-10-CM | POA: Diagnosis not present

## 2023-01-05 DIAGNOSIS — R7989 Other specified abnormal findings of blood chemistry: Secondary | ICD-10-CM | POA: Diagnosis not present

## 2023-01-05 DIAGNOSIS — F17210 Nicotine dependence, cigarettes, uncomplicated: Secondary | ICD-10-CM | POA: Diagnosis not present

## 2023-01-05 DIAGNOSIS — I10 Essential (primary) hypertension: Secondary | ICD-10-CM | POA: Diagnosis not present

## 2023-01-05 DIAGNOSIS — D649 Anemia, unspecified: Secondary | ICD-10-CM | POA: Diagnosis not present

## 2023-01-05 DIAGNOSIS — R627 Adult failure to thrive: Secondary | ICD-10-CM | POA: Diagnosis not present

## 2023-01-05 DIAGNOSIS — Z681 Body mass index (BMI) 19 or less, adult: Secondary | ICD-10-CM | POA: Diagnosis not present

## 2023-01-05 DIAGNOSIS — F324 Major depressive disorder, single episode, in partial remission: Secondary | ICD-10-CM | POA: Diagnosis not present

## 2023-01-05 DIAGNOSIS — Z8673 Personal history of transient ischemic attack (TIA), and cerebral infarction without residual deficits: Secondary | ICD-10-CM | POA: Diagnosis not present

## 2023-01-11 DIAGNOSIS — I5042 Chronic combined systolic (congestive) and diastolic (congestive) heart failure: Secondary | ICD-10-CM | POA: Diagnosis not present

## 2023-01-11 DIAGNOSIS — N183 Chronic kidney disease, stage 3 unspecified: Secondary | ICD-10-CM | POA: Insufficient documentation

## 2023-01-11 DIAGNOSIS — M5186 Other intervertebral disc disorders, lumbar region: Secondary | ICD-10-CM | POA: Diagnosis not present

## 2023-01-11 DIAGNOSIS — R54 Age-related physical debility: Secondary | ICD-10-CM | POA: Diagnosis not present

## 2023-01-11 DIAGNOSIS — M47812 Spondylosis without myelopathy or radiculopathy, cervical region: Secondary | ICD-10-CM | POA: Diagnosis not present

## 2023-01-11 DIAGNOSIS — E43 Unspecified severe protein-calorie malnutrition: Secondary | ICD-10-CM | POA: Diagnosis not present

## 2023-01-11 DIAGNOSIS — M25562 Pain in left knee: Secondary | ICD-10-CM | POA: Diagnosis not present

## 2023-01-11 DIAGNOSIS — E860 Dehydration: Secondary | ICD-10-CM | POA: Diagnosis not present

## 2023-01-11 DIAGNOSIS — E079 Disorder of thyroid, unspecified: Secondary | ICD-10-CM | POA: Diagnosis not present

## 2023-01-11 DIAGNOSIS — I724 Aneurysm of artery of lower extremity: Secondary | ICD-10-CM | POA: Diagnosis not present

## 2023-01-11 DIAGNOSIS — I130 Hypertensive heart and chronic kidney disease with heart failure and stage 1 through stage 4 chronic kidney disease, or unspecified chronic kidney disease: Secondary | ICD-10-CM | POA: Diagnosis not present

## 2023-01-11 DIAGNOSIS — R911 Solitary pulmonary nodule: Secondary | ICD-10-CM | POA: Diagnosis not present

## 2023-01-11 DIAGNOSIS — M549 Dorsalgia, unspecified: Secondary | ICD-10-CM | POA: Diagnosis not present

## 2023-01-11 DIAGNOSIS — N179 Acute kidney failure, unspecified: Secondary | ICD-10-CM | POA: Diagnosis not present

## 2023-01-11 DIAGNOSIS — R918 Other nonspecific abnormal finding of lung field: Secondary | ICD-10-CM | POA: Diagnosis not present

## 2023-01-11 DIAGNOSIS — E1122 Type 2 diabetes mellitus with diabetic chronic kidney disease: Secondary | ICD-10-CM | POA: Diagnosis not present

## 2023-01-11 DIAGNOSIS — G8929 Other chronic pain: Secondary | ICD-10-CM | POA: Diagnosis not present

## 2023-01-11 DIAGNOSIS — Z79899 Other long term (current) drug therapy: Secondary | ICD-10-CM | POA: Diagnosis not present

## 2023-01-11 DIAGNOSIS — R630 Anorexia: Secondary | ICD-10-CM | POA: Diagnosis not present

## 2023-01-11 DIAGNOSIS — M25561 Pain in right knee: Secondary | ICD-10-CM | POA: Diagnosis not present

## 2023-01-11 DIAGNOSIS — M4856XA Collapsed vertebra, not elsewhere classified, lumbar region, initial encounter for fracture: Secondary | ICD-10-CM | POA: Diagnosis not present

## 2023-01-11 DIAGNOSIS — M47814 Spondylosis without myelopathy or radiculopathy, thoracic region: Secondary | ICD-10-CM | POA: Diagnosis not present

## 2023-01-12 DIAGNOSIS — E860 Dehydration: Secondary | ICD-10-CM | POA: Diagnosis not present

## 2023-01-12 DIAGNOSIS — D539 Nutritional anemia, unspecified: Secondary | ICD-10-CM | POA: Diagnosis not present

## 2023-01-12 DIAGNOSIS — I5042 Chronic combined systolic (congestive) and diastolic (congestive) heart failure: Secondary | ICD-10-CM | POA: Diagnosis not present

## 2023-01-12 DIAGNOSIS — F5000 Anorexia nervosa, unspecified: Secondary | ICD-10-CM | POA: Diagnosis not present

## 2023-01-12 DIAGNOSIS — E43 Unspecified severe protein-calorie malnutrition: Secondary | ICD-10-CM | POA: Diagnosis not present

## 2023-01-12 DIAGNOSIS — N183 Chronic kidney disease, stage 3 unspecified: Secondary | ICD-10-CM | POA: Diagnosis not present

## 2023-01-12 DIAGNOSIS — R54 Age-related physical debility: Secondary | ICD-10-CM | POA: Diagnosis not present

## 2023-01-13 DIAGNOSIS — Z9181 History of falling: Secondary | ICD-10-CM | POA: Diagnosis not present

## 2023-01-13 DIAGNOSIS — Z7902 Long term (current) use of antithrombotics/antiplatelets: Secondary | ICD-10-CM | POA: Diagnosis not present

## 2023-01-13 DIAGNOSIS — I700 Atherosclerosis of aorta: Secondary | ICD-10-CM | POA: Diagnosis not present

## 2023-01-13 DIAGNOSIS — D649 Anemia, unspecified: Secondary | ICD-10-CM | POA: Diagnosis not present

## 2023-01-13 DIAGNOSIS — F324 Major depressive disorder, single episode, in partial remission: Secondary | ICD-10-CM | POA: Diagnosis not present

## 2023-01-13 DIAGNOSIS — I10 Essential (primary) hypertension: Secondary | ICD-10-CM | POA: Diagnosis not present

## 2023-01-13 DIAGNOSIS — Z7984 Long term (current) use of oral hypoglycemic drugs: Secondary | ICD-10-CM | POA: Diagnosis not present

## 2023-01-13 DIAGNOSIS — E119 Type 2 diabetes mellitus without complications: Secondary | ICD-10-CM | POA: Diagnosis not present

## 2023-01-13 DIAGNOSIS — F17210 Nicotine dependence, cigarettes, uncomplicated: Secondary | ICD-10-CM | POA: Diagnosis not present

## 2023-01-13 DIAGNOSIS — F419 Anxiety disorder, unspecified: Secondary | ICD-10-CM | POA: Diagnosis not present

## 2023-01-23 DIAGNOSIS — I10 Essential (primary) hypertension: Secondary | ICD-10-CM | POA: Diagnosis not present

## 2023-01-23 DIAGNOSIS — D649 Anemia, unspecified: Secondary | ICD-10-CM | POA: Diagnosis not present

## 2023-01-23 DIAGNOSIS — Z7902 Long term (current) use of antithrombotics/antiplatelets: Secondary | ICD-10-CM | POA: Diagnosis not present

## 2023-01-23 DIAGNOSIS — I700 Atherosclerosis of aorta: Secondary | ICD-10-CM | POA: Diagnosis not present

## 2023-01-23 DIAGNOSIS — Z9181 History of falling: Secondary | ICD-10-CM | POA: Diagnosis not present

## 2023-01-23 DIAGNOSIS — F324 Major depressive disorder, single episode, in partial remission: Secondary | ICD-10-CM | POA: Diagnosis not present

## 2023-01-23 DIAGNOSIS — F17210 Nicotine dependence, cigarettes, uncomplicated: Secondary | ICD-10-CM | POA: Diagnosis not present

## 2023-01-23 DIAGNOSIS — F419 Anxiety disorder, unspecified: Secondary | ICD-10-CM | POA: Diagnosis not present

## 2023-01-23 DIAGNOSIS — E119 Type 2 diabetes mellitus without complications: Secondary | ICD-10-CM | POA: Diagnosis not present

## 2023-01-23 DIAGNOSIS — Z7984 Long term (current) use of oral hypoglycemic drugs: Secondary | ICD-10-CM | POA: Diagnosis not present

## 2023-01-25 DIAGNOSIS — F419 Anxiety disorder, unspecified: Secondary | ICD-10-CM | POA: Diagnosis not present

## 2023-01-25 DIAGNOSIS — R030 Elevated blood-pressure reading, without diagnosis of hypertension: Secondary | ICD-10-CM | POA: Diagnosis not present

## 2023-01-25 DIAGNOSIS — Z7902 Long term (current) use of antithrombotics/antiplatelets: Secondary | ICD-10-CM | POA: Diagnosis not present

## 2023-01-25 DIAGNOSIS — Z8673 Personal history of transient ischemic attack (TIA), and cerebral infarction without residual deficits: Secondary | ICD-10-CM | POA: Diagnosis not present

## 2023-01-25 DIAGNOSIS — D649 Anemia, unspecified: Secondary | ICD-10-CM | POA: Diagnosis not present

## 2023-01-25 DIAGNOSIS — I700 Atherosclerosis of aorta: Secondary | ICD-10-CM | POA: Diagnosis not present

## 2023-01-25 DIAGNOSIS — Z9181 History of falling: Secondary | ICD-10-CM | POA: Diagnosis not present

## 2023-01-25 DIAGNOSIS — E119 Type 2 diabetes mellitus without complications: Secondary | ICD-10-CM | POA: Diagnosis not present

## 2023-01-25 DIAGNOSIS — Z7984 Long term (current) use of oral hypoglycemic drugs: Secondary | ICD-10-CM | POA: Diagnosis not present

## 2023-01-25 DIAGNOSIS — I10 Essential (primary) hypertension: Secondary | ICD-10-CM | POA: Diagnosis not present

## 2023-01-25 DIAGNOSIS — F17210 Nicotine dependence, cigarettes, uncomplicated: Secondary | ICD-10-CM | POA: Diagnosis not present

## 2023-01-25 DIAGNOSIS — R627 Adult failure to thrive: Secondary | ICD-10-CM | POA: Diagnosis not present

## 2023-01-25 DIAGNOSIS — E1165 Type 2 diabetes mellitus with hyperglycemia: Secondary | ICD-10-CM | POA: Diagnosis not present

## 2023-01-25 DIAGNOSIS — F324 Major depressive disorder, single episode, in partial remission: Secondary | ICD-10-CM | POA: Diagnosis not present

## 2023-01-25 DIAGNOSIS — Z720 Tobacco use: Secondary | ICD-10-CM | POA: Diagnosis not present

## 2023-01-25 DIAGNOSIS — R911 Solitary pulmonary nodule: Secondary | ICD-10-CM | POA: Diagnosis not present

## 2023-01-25 DIAGNOSIS — R296 Repeated falls: Secondary | ICD-10-CM | POA: Diagnosis not present

## 2023-01-25 DIAGNOSIS — Z681 Body mass index (BMI) 19 or less, adult: Secondary | ICD-10-CM | POA: Diagnosis not present

## 2023-01-30 DIAGNOSIS — E119 Type 2 diabetes mellitus without complications: Secondary | ICD-10-CM | POA: Diagnosis not present

## 2023-01-30 DIAGNOSIS — D649 Anemia, unspecified: Secondary | ICD-10-CM | POA: Diagnosis not present

## 2023-01-30 DIAGNOSIS — I10 Essential (primary) hypertension: Secondary | ICD-10-CM | POA: Diagnosis not present

## 2023-01-30 DIAGNOSIS — I700 Atherosclerosis of aorta: Secondary | ICD-10-CM | POA: Diagnosis not present

## 2023-01-30 DIAGNOSIS — F324 Major depressive disorder, single episode, in partial remission: Secondary | ICD-10-CM | POA: Diagnosis not present

## 2023-01-30 DIAGNOSIS — F419 Anxiety disorder, unspecified: Secondary | ICD-10-CM | POA: Diagnosis not present

## 2023-01-30 DIAGNOSIS — E1165 Type 2 diabetes mellitus with hyperglycemia: Secondary | ICD-10-CM | POA: Diagnosis not present

## 2023-01-30 DIAGNOSIS — R5383 Other fatigue: Secondary | ICD-10-CM | POA: Diagnosis not present

## 2023-01-30 DIAGNOSIS — Z7902 Long term (current) use of antithrombotics/antiplatelets: Secondary | ICD-10-CM | POA: Diagnosis not present

## 2023-01-30 DIAGNOSIS — F17210 Nicotine dependence, cigarettes, uncomplicated: Secondary | ICD-10-CM | POA: Diagnosis not present

## 2023-01-30 DIAGNOSIS — Z9181 History of falling: Secondary | ICD-10-CM | POA: Diagnosis not present

## 2023-01-30 DIAGNOSIS — Z7984 Long term (current) use of oral hypoglycemic drugs: Secondary | ICD-10-CM | POA: Diagnosis not present

## 2023-02-03 DIAGNOSIS — D649 Anemia, unspecified: Secondary | ICD-10-CM | POA: Diagnosis not present

## 2023-02-03 DIAGNOSIS — F419 Anxiety disorder, unspecified: Secondary | ICD-10-CM | POA: Diagnosis not present

## 2023-02-03 DIAGNOSIS — I10 Essential (primary) hypertension: Secondary | ICD-10-CM | POA: Diagnosis not present

## 2023-02-03 DIAGNOSIS — I700 Atherosclerosis of aorta: Secondary | ICD-10-CM | POA: Diagnosis not present

## 2023-02-03 DIAGNOSIS — F324 Major depressive disorder, single episode, in partial remission: Secondary | ICD-10-CM | POA: Diagnosis not present

## 2023-02-03 DIAGNOSIS — Z7902 Long term (current) use of antithrombotics/antiplatelets: Secondary | ICD-10-CM | POA: Diagnosis not present

## 2023-02-03 DIAGNOSIS — Z9181 History of falling: Secondary | ICD-10-CM | POA: Diagnosis not present

## 2023-02-03 DIAGNOSIS — F17210 Nicotine dependence, cigarettes, uncomplicated: Secondary | ICD-10-CM | POA: Diagnosis not present

## 2023-02-03 DIAGNOSIS — Z7984 Long term (current) use of oral hypoglycemic drugs: Secondary | ICD-10-CM | POA: Diagnosis not present

## 2023-02-03 DIAGNOSIS — E119 Type 2 diabetes mellitus without complications: Secondary | ICD-10-CM | POA: Diagnosis not present

## 2023-02-06 DIAGNOSIS — Z681 Body mass index (BMI) 19 or less, adult: Secondary | ICD-10-CM | POA: Diagnosis not present

## 2023-02-06 DIAGNOSIS — M533 Sacrococcygeal disorders, not elsewhere classified: Secondary | ICD-10-CM | POA: Diagnosis not present

## 2023-02-08 DIAGNOSIS — F17210 Nicotine dependence, cigarettes, uncomplicated: Secondary | ICD-10-CM | POA: Diagnosis not present

## 2023-02-08 DIAGNOSIS — F419 Anxiety disorder, unspecified: Secondary | ICD-10-CM | POA: Diagnosis not present

## 2023-02-08 DIAGNOSIS — D649 Anemia, unspecified: Secondary | ICD-10-CM | POA: Diagnosis not present

## 2023-02-08 DIAGNOSIS — Z7984 Long term (current) use of oral hypoglycemic drugs: Secondary | ICD-10-CM | POA: Diagnosis not present

## 2023-02-08 DIAGNOSIS — I10 Essential (primary) hypertension: Secondary | ICD-10-CM | POA: Diagnosis not present

## 2023-02-08 DIAGNOSIS — Z9181 History of falling: Secondary | ICD-10-CM | POA: Diagnosis not present

## 2023-02-08 DIAGNOSIS — F324 Major depressive disorder, single episode, in partial remission: Secondary | ICD-10-CM | POA: Diagnosis not present

## 2023-02-08 DIAGNOSIS — I700 Atherosclerosis of aorta: Secondary | ICD-10-CM | POA: Diagnosis not present

## 2023-02-08 DIAGNOSIS — E119 Type 2 diabetes mellitus without complications: Secondary | ICD-10-CM | POA: Diagnosis not present

## 2023-02-08 DIAGNOSIS — Z7902 Long term (current) use of antithrombotics/antiplatelets: Secondary | ICD-10-CM | POA: Diagnosis not present

## 2023-02-13 DIAGNOSIS — I739 Peripheral vascular disease, unspecified: Secondary | ICD-10-CM | POA: Diagnosis not present

## 2023-02-14 DIAGNOSIS — F324 Major depressive disorder, single episode, in partial remission: Secondary | ICD-10-CM | POA: Diagnosis not present

## 2023-02-14 DIAGNOSIS — E119 Type 2 diabetes mellitus without complications: Secondary | ICD-10-CM | POA: Diagnosis not present

## 2023-02-14 DIAGNOSIS — F419 Anxiety disorder, unspecified: Secondary | ICD-10-CM | POA: Diagnosis not present

## 2023-02-14 DIAGNOSIS — Z7902 Long term (current) use of antithrombotics/antiplatelets: Secondary | ICD-10-CM | POA: Diagnosis not present

## 2023-02-14 DIAGNOSIS — F17210 Nicotine dependence, cigarettes, uncomplicated: Secondary | ICD-10-CM | POA: Diagnosis not present

## 2023-02-14 DIAGNOSIS — Z7984 Long term (current) use of oral hypoglycemic drugs: Secondary | ICD-10-CM | POA: Diagnosis not present

## 2023-02-14 DIAGNOSIS — I10 Essential (primary) hypertension: Secondary | ICD-10-CM | POA: Diagnosis not present

## 2023-02-14 DIAGNOSIS — D649 Anemia, unspecified: Secondary | ICD-10-CM | POA: Diagnosis not present

## 2023-02-14 DIAGNOSIS — I700 Atherosclerosis of aorta: Secondary | ICD-10-CM | POA: Diagnosis not present

## 2023-02-14 DIAGNOSIS — Z9181 History of falling: Secondary | ICD-10-CM | POA: Diagnosis not present

## 2023-02-16 DIAGNOSIS — G8929 Other chronic pain: Secondary | ICD-10-CM | POA: Diagnosis not present

## 2023-02-16 DIAGNOSIS — F17210 Nicotine dependence, cigarettes, uncomplicated: Secondary | ICD-10-CM | POA: Diagnosis not present

## 2023-02-16 DIAGNOSIS — E43 Unspecified severe protein-calorie malnutrition: Secondary | ICD-10-CM | POA: Diagnosis not present

## 2023-02-16 DIAGNOSIS — I5032 Chronic diastolic (congestive) heart failure: Secondary | ICD-10-CM | POA: Diagnosis not present

## 2023-02-16 DIAGNOSIS — Z515 Encounter for palliative care: Secondary | ICD-10-CM | POA: Diagnosis not present

## 2023-02-16 DIAGNOSIS — F332 Major depressive disorder, recurrent severe without psychotic features: Secondary | ICD-10-CM | POA: Diagnosis not present

## 2023-02-17 DIAGNOSIS — Z90710 Acquired absence of both cervix and uterus: Secondary | ICD-10-CM | POA: Diagnosis not present

## 2023-02-17 DIAGNOSIS — Z780 Asymptomatic menopausal state: Secondary | ICD-10-CM | POA: Diagnosis not present

## 2023-02-17 DIAGNOSIS — M545 Low back pain, unspecified: Secondary | ICD-10-CM | POA: Diagnosis not present

## 2023-02-17 DIAGNOSIS — M533 Sacrococcygeal disorders, not elsewhere classified: Secondary | ICD-10-CM | POA: Diagnosis not present

## 2023-02-17 DIAGNOSIS — Z9049 Acquired absence of other specified parts of digestive tract: Secondary | ICD-10-CM | POA: Diagnosis not present

## 2023-02-20 DIAGNOSIS — F419 Anxiety disorder, unspecified: Secondary | ICD-10-CM | POA: Diagnosis not present

## 2023-02-20 DIAGNOSIS — D649 Anemia, unspecified: Secondary | ICD-10-CM | POA: Diagnosis not present

## 2023-02-20 DIAGNOSIS — F324 Major depressive disorder, single episode, in partial remission: Secondary | ICD-10-CM | POA: Diagnosis not present

## 2023-02-20 DIAGNOSIS — F17210 Nicotine dependence, cigarettes, uncomplicated: Secondary | ICD-10-CM | POA: Diagnosis not present

## 2023-02-20 DIAGNOSIS — I10 Essential (primary) hypertension: Secondary | ICD-10-CM | POA: Diagnosis not present

## 2023-02-20 DIAGNOSIS — Z7902 Long term (current) use of antithrombotics/antiplatelets: Secondary | ICD-10-CM | POA: Diagnosis not present

## 2023-02-20 DIAGNOSIS — Z9181 History of falling: Secondary | ICD-10-CM | POA: Diagnosis not present

## 2023-02-20 DIAGNOSIS — I700 Atherosclerosis of aorta: Secondary | ICD-10-CM | POA: Diagnosis not present

## 2023-02-20 DIAGNOSIS — Z7984 Long term (current) use of oral hypoglycemic drugs: Secondary | ICD-10-CM | POA: Diagnosis not present

## 2023-02-20 DIAGNOSIS — E119 Type 2 diabetes mellitus without complications: Secondary | ICD-10-CM | POA: Diagnosis not present

## 2023-03-02 DIAGNOSIS — I2510 Atherosclerotic heart disease of native coronary artery without angina pectoris: Secondary | ICD-10-CM | POA: Diagnosis not present

## 2023-03-02 DIAGNOSIS — R2681 Unsteadiness on feet: Secondary | ICD-10-CM | POA: Diagnosis not present

## 2023-03-02 DIAGNOSIS — M8580 Other specified disorders of bone density and structure, unspecified site: Secondary | ICD-10-CM | POA: Diagnosis not present

## 2023-03-02 DIAGNOSIS — R636 Underweight: Secondary | ICD-10-CM | POA: Diagnosis not present

## 2023-03-02 DIAGNOSIS — E1151 Type 2 diabetes mellitus with diabetic peripheral angiopathy without gangrene: Secondary | ICD-10-CM | POA: Diagnosis not present

## 2023-03-02 DIAGNOSIS — N1832 Chronic kidney disease, stage 3b: Secondary | ICD-10-CM | POA: Diagnosis not present

## 2023-03-02 DIAGNOSIS — E785 Hyperlipidemia, unspecified: Secondary | ICD-10-CM | POA: Diagnosis not present

## 2023-03-02 DIAGNOSIS — M1990 Unspecified osteoarthritis, unspecified site: Secondary | ICD-10-CM | POA: Diagnosis not present

## 2023-03-02 DIAGNOSIS — K219 Gastro-esophageal reflux disease without esophagitis: Secondary | ICD-10-CM | POA: Diagnosis not present

## 2023-03-02 DIAGNOSIS — F329 Major depressive disorder, single episode, unspecified: Secondary | ICD-10-CM | POA: Diagnosis not present

## 2023-03-02 DIAGNOSIS — F419 Anxiety disorder, unspecified: Secondary | ICD-10-CM | POA: Diagnosis not present

## 2023-03-02 DIAGNOSIS — I509 Heart failure, unspecified: Secondary | ICD-10-CM | POA: Diagnosis not present

## 2023-03-23 DIAGNOSIS — I5032 Chronic diastolic (congestive) heart failure: Secondary | ICD-10-CM | POA: Diagnosis not present

## 2023-03-23 DIAGNOSIS — E43 Unspecified severe protein-calorie malnutrition: Secondary | ICD-10-CM | POA: Diagnosis not present

## 2023-03-23 DIAGNOSIS — F332 Major depressive disorder, recurrent severe without psychotic features: Secondary | ICD-10-CM | POA: Diagnosis not present

## 2023-03-23 DIAGNOSIS — Z515 Encounter for palliative care: Secondary | ICD-10-CM | POA: Diagnosis not present

## 2023-03-23 DIAGNOSIS — G8929 Other chronic pain: Secondary | ICD-10-CM | POA: Diagnosis not present

## 2023-03-23 DIAGNOSIS — F17210 Nicotine dependence, cigarettes, uncomplicated: Secondary | ICD-10-CM | POA: Diagnosis not present

## 2023-03-30 ENCOUNTER — Telehealth: Payer: Self-pay | Admitting: Surgery

## 2023-03-31 ENCOUNTER — Ambulatory Visit: Payer: Medicare HMO | Attending: Internal Medicine | Admitting: Internal Medicine

## 2023-03-31 ENCOUNTER — Encounter: Payer: Self-pay | Admitting: Internal Medicine

## 2023-03-31 VITALS — BP 120/60 | HR 90 | Ht 62.0 in | Wt 80.0 lb

## 2023-03-31 DIAGNOSIS — Z720 Tobacco use: Secondary | ICD-10-CM

## 2023-03-31 DIAGNOSIS — I429 Cardiomyopathy, unspecified: Secondary | ICD-10-CM | POA: Diagnosis not present

## 2023-03-31 DIAGNOSIS — Z136 Encounter for screening for cardiovascular disorders: Secondary | ICD-10-CM | POA: Diagnosis not present

## 2023-03-31 DIAGNOSIS — I5022 Chronic systolic (congestive) heart failure: Secondary | ICD-10-CM | POA: Diagnosis not present

## 2023-03-31 DIAGNOSIS — Z8673 Personal history of transient ischemic attack (TIA), and cerebral infarction without residual deficits: Secondary | ICD-10-CM | POA: Diagnosis not present

## 2023-03-31 DIAGNOSIS — I739 Peripheral vascular disease, unspecified: Secondary | ICD-10-CM | POA: Diagnosis not present

## 2023-03-31 MED ORDER — ASPIRIN 81 MG PO TBEC: 81.0000 mg | DELAYED_RELEASE_TABLET | Freq: Every day | ORAL | 1 refills | Status: DC

## 2023-03-31 MED ORDER — CILOSTAZOL 50 MG PO TABS: 50.0000 mg | ORAL_TABLET | Freq: Two times a day (BID) | ORAL | 1 refills | Status: DC

## 2023-03-31 NOTE — Patient Instructions (Addendum)
Medication Instructions:  Your physician has recommended you make the following change in your medication:  Stop taking Plavix Start taking Aspirin 81 mg once daily Start taking Cilostazol 50 mg twice a day Continue taking all other medications as prescribed  Labwork: None  Testing/Procedures: Your physician has requested that you have an echocardiogram. Echocardiography is a painless test that uses sound waves to create images of your heart. It provides your doctor with information about the size and shape of your heart and how well your heart's chambers and valves are working. This procedure takes approximately one hour. There are no restrictions for this procedure. Please do NOT wear cologne, perfume, aftershave, or lotions (deodorant is allowed). Please arrive 15 minutes prior to your appointment time.   Follow-Up: Your physician recommends that you schedule a follow-up appointment in: 4 months  Any Other Special Instructions Will Be Listed Below (If Applicable).  If you need a refill on your cardiac medications before your next appointment, please call your pharmacy.

## 2023-03-31 NOTE — Progress Notes (Signed)
Cardiology Office Note  Date: 03/31/2023   ID: IVANELL MARCINKO, DOB 1955-08-30, MRN [id number]  PCP:  Royann Shivers, PA-C  Cardiologist:  Nona Dell, MD Electrophysiologist:  None   History of Present Illness: Ashlee Austin is a 67 y.o. female known to have chronic systolic heart failure LVEF 30 to 35% in 5/24, history of stroke in 2021 on chronic Plavix monotherapy, HTN, COPD was referred to cardiology clinic to reestablish care.  Upon chart review, patient was newly diagnosed with cardiomyopathy LVEF 3035% at [facility] in 5/24.  She had multiple ER visits for frequent falls due to severe protein calorie malnutrition, poor p.o. intake of fluids and diet.  She lost her husband in 12 years ago and lost any desire to take care of herself.  She does have help at home, cleaner comes to clean her house once a month, her daughter-in-law helps her with groceries.  Patient sometimes cooks and mostly depends on the microwave food.  Denies having angina or DOE.  Does not do much at home to even reproduce her symptoms of angina or DOE.  Denies feeling dizzy/lightheaded.  No syncope.  She reported feeling wobbly in her legs before having a fall.  Past Medical History:  Diagnosis Date   Carotid artery disease (HCC)    COPD (chronic obstructive pulmonary disease) (HCC)    Coronary atherosclerosis of native coronary artery    [facility]; negative Dobutamine echo 6/11   Depression    Essential hypertension    Hyperlipidemia    Hypothyroidism    Stroke (HCC) 2020   Stroke [facility]) 2014   Type 2 diabetes mellitus (HCC)     Past Surgical History:  Procedure Laterality Date   BIOPSY  10/20/2020   Procedure: BIOPSY;  Surgeon: Lanelle Bal, DO;  [facility];  Service: Endoscopy;;   CARPAL TUNNEL RELEASE     COLONOSCOPY WITH PROPOFOL N/A 10/20/2020   Procedure: COLONOSCOPY WITH PROPOFOL;  Surgeon: Lanelle Bal, DO;  [facility];  Service: Endoscopy;  Laterality:  N/A;  pm (early as possible), diabetic   CYSTECTOMY     Spine   ESOPHAGOGASTRODUODENOSCOPY (EGD) WITH PROPOFOL N/A 10/20/2020   Procedure: ESOPHAGOGASTRODUODENOSCOPY (EGD) WITH PROPOFOL;  Surgeon: Lanelle Bal, DO;  [facility];  Service: Endoscopy;  Laterality: N/A;   TOTAL ABDOMINAL HYSTERECTOMY  2004   TYMPANOPLASTY      Current Outpatient Medications  Medication Sig Dispense Refill   amLODipine (NORVASC) 5 MG tablet Take 5 mg by mouth daily.     aspirin EC 81 MG tablet Take 1 tablet (81 mg total) by mouth daily. Swallow whole. 90 tablet 1   cilostazol (PLETAL) 50 MG tablet Take 1 tablet (50 mg total) by mouth 2 (two) times daily. 180 tablet 1   Cyanocobalamin (VITAMIN B12 PO) Take 1 tablet by mouth daily.     ENTRESTO 24-26 MG Take 1 tablet by mouth 2 (two) times daily.     escitalopram (LEXAPRO) 10 MG tablet Take 10 mg by mouth daily.     feeding supplement (ENSURE ENLIVE / ENSURE PLUS) LIQD Take 237 mLs by mouth 3 (three) times daily between meals. 237 mL 12   FEROSUL 325 (65 Fe) MG tablet Take 325 mg by mouth 2 (two) times daily with a meal.     metFORMIN (GLUCOPHAGE) 1000 MG tablet Take 1,000 mg by mouth 2 (two) times daily with a meal.     metoprolol succinate (TOPROL-XL)  25 MG 24 hr tablet Take 25 mg by mouth daily.     Multiple Vitamin (MULTIVITAMIN WITH MINERALS) TABS tablet Take 1 tablet by mouth daily.     pantoprazole (PROTONIX) 40 MG tablet TAKE 1 TABLET(40 MG) BY MOUTH TWICE DAILY 60 tablet 5   Probiotic Product (PROBIOTIC PO) Take 1 tablet by mouth daily.     sitaGLIPtin (JANUVIA) 50 MG tablet Take 50 mg by mouth daily.     No current facility-administered medications for this visit.   Allergies:  Codeine   Social History: The patient  reports that she has been smoking cigarettes. She started smoking about 51 years ago. She has a 47.7 pack-year smoking history. She has never used smokeless tobacco. She reports that she does not drink alcohol and does not  use drugs.   Family History: The patient's family history includes Coronary artery disease in her mother; Lung cancer in her father.   ROS:  Please see the history of present illness. Otherwise, complete review of systems is positive for none  All other systems are reviewed and negative.   Physical Exam: VS:  BP 120/60   Pulse 90   Ht 5\' 2"  (1.575 m)   Wt 80 lb (36.3 kg)   SpO2 97%   BMI 14.63 kg/m , BMI Body mass index is 14.63 kg/m.  Wt Readings from Last 3 Encounters:  03/31/23 80 lb (36.3 kg)  03/10/22 84 lb 3.5 oz (38.2 kg)  10/16/20 91 lb 8 oz (41.5 kg)    General: Patient appears comfortable at rest. HEENT: Conjunctiva and lids normal, oropharynx clear with moist mucosa. Neck: Supple, no elevated JVP or carotid bruits, no thyromegaly. Lungs: Clear to auscultation, nonlabored breathing at rest. Cardiac: Regular rate and rhythm, no S3 or significant systolic murmur, no pericardial rub. Abdomen: Soft, nontender, no hepatomegaly, bowel sounds present, no guarding or rebound. Extremities: No pitting edema Skin: Warm and dry. Musculoskeletal: No kyphosis. Neuropsychiatric: Alert and oriented x3, affect grossly appropriate.  Recent Labwork: No results found for requested labs within last 365 days.     Component Value Date/Time   CHOL 99 03/11/2022 0047   TRIG 129 03/11/2022 0047   HDL 28 (L) 03/11/2022 0047   CHOLHDL 3.5 03/11/2022 0047   VLDL 26 03/11/2022 0047   LDLCALC 45 03/11/2022 0047      Assessment and Plan:  New onset cardiomyopathy LVEF 30 to 35% in 11/2022 at [facility], unclear etiology: BMI is 14.63, does not like to drink water or eat healthy meals. Lost desire to take care of herself after her husband passed away around 12 years ago.  Follow-up with PCP for management of any underlying undiagnosed depression. She is currently on GDMT, metoprolol succinate 25 mg once daily and Entresto 24-26 mg twice daily.  Will obtain limited echocardiogram for LVEF assessment.   Asymptomatic, not much active at home given to reproduce symptoms of angina or DOE.  Will defer ischemia evaluation at this time, will discuss about ischemia evaluation in this clinic visit.  Bilateral lower EXTR PAD (R ABI 0.79 and L ABI 0.84), claudication in RLE> LLE: Switch Plavix to aspirin 81 mg once daily, not on statin due to normal LDL levels, smoking cessation counseling provided and strongly encouraged, start cilostazol 50 mg twice daily (discussed side effects of palpitations).  Discussed the benefits of supervised exercise therapy for PAD, patient declined at this time due to transportation issues.  History of CVA in 2021: Not embolic in etiology, TEE was not  indicated.  She was supposed to get a 2-week event monitor but never underwent this study.  As it has been more than 3 years, will defer event monitor at this time.  Denies having any palpitations.  She has been on Plavix monotherapy since 2021, will switch Plavix to aspirin 81 mg once daily and currently not on statin due to normal LDL levels.  Echocardiogram showed no interatrial communication.  Nicotine abuse: Smokes currently, 1 pack/day.  Discussed cutting down cigarettes.  She is motivated to cut down.  Smoking cessation counseling provided. Smoking cessation instruction/counseling given:  counseled patient on the dangers of tobacco use, advised patient to stop smoking, and reviewed strategies to maximize success   History of frequent falls and severe protein calorie malnutrition: Denies having any dizziness/lightheadedness prior to fall.  She reported feeling wobbly in her legs prior to the fall.  Does not drink water as she does not like the taste of water or eat any healthy meals.  Strongly recommended to drink water(fruit infused).    Medication Adjustments/Labs and Tests Ordered: Current medicines are reviewed at length with the patient today.  Concerns regarding medicines are outlined above.    Disposition:  Follow up   4 months  Signed Olsen Mccutchan Verne Spurr, MD, 03/31/2023 4:00 PM    [facility] at [facility] [address] [place], [place], [place] [address]

## 2023-04-03 ENCOUNTER — Encounter: Payer: Medicare HMO | Admitting: Surgery

## 2023-04-07 DIAGNOSIS — Z681 Body mass index (BMI) 19 or less, adult: Secondary | ICD-10-CM | POA: Diagnosis not present

## 2023-04-07 DIAGNOSIS — M533 Sacrococcygeal disorders, not elsewhere classified: Secondary | ICD-10-CM | POA: Diagnosis not present

## 2023-04-17 DIAGNOSIS — Z515 Encounter for palliative care: Secondary | ICD-10-CM | POA: Diagnosis not present

## 2023-04-17 DIAGNOSIS — I5032 Chronic diastolic (congestive) heart failure: Secondary | ICD-10-CM | POA: Diagnosis not present

## 2023-04-17 DIAGNOSIS — F332 Major depressive disorder, recurrent severe without psychotic features: Secondary | ICD-10-CM | POA: Diagnosis not present

## 2023-04-17 DIAGNOSIS — E43 Unspecified severe protein-calorie malnutrition: Secondary | ICD-10-CM | POA: Diagnosis not present

## 2023-04-17 DIAGNOSIS — F17210 Nicotine dependence, cigarettes, uncomplicated: Secondary | ICD-10-CM | POA: Diagnosis not present

## 2023-04-17 DIAGNOSIS — G8929 Other chronic pain: Secondary | ICD-10-CM | POA: Diagnosis not present

## 2023-04-18 ENCOUNTER — Other Ambulatory Visit: Payer: Medicare HMO

## 2023-04-20 ENCOUNTER — Other Ambulatory Visit: Payer: Medicare HMO

## 2023-04-27 ENCOUNTER — Ambulatory Visit: Payer: Medicare HMO | Attending: Internal Medicine

## 2023-04-27 DIAGNOSIS — I429 Cardiomyopathy, unspecified: Secondary | ICD-10-CM

## 2023-04-27 DIAGNOSIS — I5030 Unspecified diastolic (congestive) heart failure: Secondary | ICD-10-CM

## 2023-05-01 LAB — ECHOCARDIOGRAM LIMITED
AV Peak grad: 5.7 mm[Hg]
Ao pk vel: 1.19 m/s
Area-P 1/2: 3.16 cm2
Calc EF: 47.2 %
S' Lateral: 3.6 cm
Single Plane A2C EF: 49.8 %
Single Plane A4C EF: 48.1 %

## 2023-05-03 ENCOUNTER — Ambulatory Visit: Payer: Medicare HMO | Admitting: "Endocrinology

## 2023-05-19 ENCOUNTER — Telehealth: Payer: Self-pay

## 2023-05-19 NOTE — Telephone Encounter (Signed)
Patient informed and verbalized understanding of plan. 

## 2023-05-19 NOTE — Telephone Encounter (Signed)
-----   Message from Vishnu P Mallipeddi sent at 05/12/2023  1:52 PM EDT ----- Heart pumping function is mildly reduced, 40 to 45% (55% and above is normal), abnormal diastolic function with elevated LVEDP and no valvular heart disease.  CVP 3 mmHg.  Heart pumping function has improved from 30 to 35% in May 2024 at [facility].  Continue current medications.

## 2023-05-22 ENCOUNTER — Encounter: Payer: Self-pay | Admitting: Vascular Surgery

## 2023-05-22 ENCOUNTER — Encounter: Payer: Medicare HMO | Admitting: Surgery

## 2023-05-23 ENCOUNTER — Encounter: Payer: Self-pay | Admitting: Vascular Surgery

## 2023-05-23 ENCOUNTER — Ambulatory Visit (INDEPENDENT_AMBULATORY_CARE_PROVIDER_SITE_OTHER): Payer: Medicare HMO | Admitting: Vascular Surgery

## 2023-05-23 VITALS — BP 144/82 | HR 102 | Temp 97.9°F | Ht 62.0 in | Wt 83.0 lb

## 2023-05-23 DIAGNOSIS — I739 Peripheral vascular disease, unspecified: Secondary | ICD-10-CM | POA: Diagnosis not present

## 2023-05-23 MED ORDER — CILOSTAZOL 100 MG PO TABS: 100.0000 mg | ORAL_TABLET | Freq: Two times a day (BID) | ORAL | 11 refills | Status: DC

## 2023-05-23 NOTE — Progress Notes (Signed)
Patient name: Ashlee Austin MRN: [id number] DOB: [birth year] Sex: female  REASON FOR CONSULT: Moderate loss of blood flow to legs  HPI: Ashlee Austin is a 67 y.o. female, with hx carotid artery disease, COPD, CAD, HTN, HLD, DM, tobacco abuse that presents for evaluation of moderate loss of blood flow to legs.  Patient states she has been having pain in both her lower legs for months.  This is worse in the left leg.  Today she states more paiun around the knee joint.  The pain bothers her during the day and even at night.  She does not have to be walking.  She did have ABIs that were 0.79 on the right and 0.84 on the left.  Past Medical History:  Diagnosis Date   Carotid artery disease (HCC)    COPD (chronic obstructive pulmonary disease) (HCC)    Coronary atherosclerosis of native coronary artery    [facility]; negative Dobutamine echo 6/11   Depression    Essential hypertension    Hyperlipidemia    Hypothyroidism    Stroke (HCC) 2020   Stroke [facility]) 2014   Type 2 diabetes mellitus (HCC)     Past Surgical History:  Procedure Laterality Date   BIOPSY  10/20/2020   Procedure: BIOPSY;  Surgeon: Lanelle Bal, DO;  Location: [facility];  Service: Endoscopy;;   CARPAL TUNNEL RELEASE     COLONOSCOPY WITH PROPOFOL N/A 10/20/2020   Procedure: COLONOSCOPY WITH PROPOFOL;  Surgeon: Lanelle Bal, DO;  Location: [facility];  Service: Endoscopy;  Laterality: N/A;  pm (early as possible), diabetic   CYSTECTOMY     Spine   ESOPHAGOGASTRODUODENOSCOPY (EGD) WITH PROPOFOL N/A 10/20/2020   Procedure: ESOPHAGOGASTRODUODENOSCOPY (EGD) WITH PROPOFOL;  Surgeon: Lanelle Bal, DO;  Location: [facility];  Service: Endoscopy;  Laterality: N/A;   TOTAL ABDOMINAL HYSTERECTOMY  2004   TYMPANOPLASTY      Family History  Problem Relation Age of Onset   Diabetes Mother    Coronary artery disease Mother        MI age 78   Heart attack Mother    Lung cancer Father    Diabetes Sister      SOCIAL HISTORY: Social History   Socioeconomic History   Marital status: Widowed    Spouse name: Not on file   Number of children: Not on file   Years of education: Not on file   Highest education level: Not on file  Occupational History    Employer: [employer]    Comment: Full time at [employer]  Tobacco Use   Smoking status: Every Day    Current packs/day: 0.00    Average packs/day: 1 pack/day for 47.7 years (47.7 ttl pk-yrs)    Types: Cigarettes    Start date: 01/31/1972    Last attempt to quit: 10/17/2019    Years since quitting: 3.6   Smokeless tobacco: Never  Substance and Sexual Activity   Alcohol use: No   Drug use: No   Sexual activity: Not on file  Other Topics Concern   Not on file  Social History Narrative   Not on file   Social Determinants of Health   Financial Resource Strain: Low Risk  (01/12/2023)   Received from [facility]   Overall Financial Resource Strain (CARDIA)    Difficulty of Paying Living Expenses: Not hard at all  Food Insecurity: No Food Insecurity (01/12/2023)   Received from [facility]  Hunger Vital Sign    Worried About Running Out of Food in the Last Year: Never true    Ran Out of Food in the Last Year: Never true  Transportation Needs: No Transportation Needs (01/12/2023)   Received from [facility] - Transportation    Lack of Transportation (Medical): No    Lack of Transportation (Non-Medical): No  Physical Activity: Inactive (09/19/2022)   Received from [facility], [facility]   Exercise Vital Sign    Days of Exercise per Week: 0 days    Minutes of Exercise per Session: 0 min  Stress: No Stress Concern Present (09/19/2022)   Received from [facility], [facility] - Occupational Stress Questionnaire    Feeling of Stress : Not at all  Social Connections: Moderately Isolated (09/19/2022)   Received from [facility]    Social Connection and Isolation Panel [NHANES]    Frequency of Communication with Friends and Family: Three times a week    Frequency of Social Gatherings with Friends and Family: Never    Attends Religious Services: More than 4 times per year    Active Member of Clubs or Organizations: No    Attends Banker Meetings: Never    Marital Status: Widowed  Intimate Partner Violence: Not At Risk (09/19/2022)   Received from [facility], [facility]   Humiliation, Afraid, Rape, and Kick questionnaire    Fear of Current or Ex-Partner: No    Emotionally Abused: No    Physically Abused: No    Sexually Abused: No    Allergies  Allergen Reactions   Codeine Itching    Current Outpatient Medications  Medication Sig Dispense Refill   BD PEN NEEDLE NANO 2ND GEN 32G X 4 MM MISC USE AS DIRECTED EVERY DAY     buPROPion (WELLBUTRIN XL) 150 MG 24 hr tablet Take 150 mg by mouth daily.     cholestyramine (QUESTRAN) 4 GM/DOSE powder Take 4 g by mouth 3 (three) times daily with meals.     HYDROcodone-acetaminophen (NORCO) 10-325 MG tablet Take 1 tablet by mouth 2 (two) times daily.     LANTUS SOLOSTAR 100 UNIT/ML Solostar Pen SMARTSIG:10 Unit(s) SUB-Q Daily     mirtazapine (REMERON) 30 MG tablet Take 30 mg by mouth daily.     albuterol (VENTOLIN HFA) 108 (90 Base) MCG/ACT inhaler Inhale 2 puffs into the lungs every 4 (four) hours as needed.     amLODipine (NORVASC) 5 MG tablet Take 5 mg by mouth daily.     aspirin EC 81 MG tablet Take 1 tablet (81 mg total) by mouth daily. Swallow whole. 90 tablet 1   atorvastatin (LIPITOR) 80 MG tablet Take 80 mg by mouth at bedtime.     cilostazol (PLETAL) 50 MG tablet Take 1 tablet (50 mg total) by mouth 2 (two) times daily. 180 tablet 1   Cyanocobalamin (VITAMIN B12 PO) Take 1 tablet by mouth daily.     ENTRESTO 24-26 MG Take 1 tablet by mouth 2 (two) times daily.     escitalopram (LEXAPRO) 10 MG tablet Take 10 mg by mouth daily.     feeding  supplement (ENSURE ENLIVE / ENSURE PLUS) LIQD Take 237 mLs by mouth 3 (three) times daily between meals. 237 mL 12   FEROSUL 325 (65 Fe) MG tablet Take 325 mg by mouth 2 (two) times daily with a meal.  metFORMIN (GLUCOPHAGE) 1000 MG tablet Take 1,000 mg by mouth 2 (two) times daily with a meal.     metoprolol succinate (TOPROL-XL) 25 MG 24 hr tablet Take 25 mg by mouth daily.     Multiple Vitamin (MULTIVITAMIN WITH MINERALS) TABS tablet Take 1 tablet by mouth daily.     pantoprazole (PROTONIX) 40 MG tablet TAKE 1 TABLET(40 MG) BY MOUTH TWICE DAILY 60 tablet 5   Probiotic Product (PROBIOTIC PO) Take 1 tablet by mouth daily.     sitaGLIPtin (JANUVIA) 50 MG tablet Take 50 mg by mouth daily.     No current facility-administered medications for this visit.    REVIEW OF SYSTEMS:  [X]  denotes positive finding, [ ]  denotes negative finding Cardiac  Comments:  Chest pain or chest pressure:    Shortness of breath upon exertion:    Short of breath when lying flat:    Irregular heart rhythm:        Vascular    Pain in calf, thigh, or hip brought on by ambulation:    Pain in feet at night that wakes you up from your sleep:     Blood clot in your veins:    Leg swelling:         Pulmonary    Oxygen at home:    Productive cough:     Wheezing:         Neurologic    Sudden weakness in arms or legs:     Sudden numbness in arms or legs:     Sudden onset of difficulty speaking or slurred speech:    Temporary loss of vision in one eye:     Problems with dizziness:         Gastrointestinal    Blood in stool:     Vomited blood:         Genitourinary    Burning when urinating:     Blood in urine:        Psychiatric    Major depression:         Hematologic    Bleeding problems:    Problems with blood clotting too easily:        Skin    Rashes or ulcers:        Constitutional    Fever or chills:      PHYSICAL EXAM: There were no vitals filed for this visit.  GENERAL: The  patient is a well-nourished female, in no acute distress. The vital signs are documented above. CARDIAC: There is a regular rate and rhythm.  VASCULAR:  Bilateral femoral pulses palpable Right DP palpable Left DP palpable PULMONARY: No respiratory distress. ABDOMEN: Soft and non-tender with normal pitched bowel sounds.  MUSCULOSKELETAL: There are no major deformities or cyanosis. NEUROLOGIC: No focal weakness or paresthesias are detected. SKIN: There are no ulcers or rashes noted. PSYCHIATRIC: The patient has a normal affect.  DATA:   ABI's today were 0.79 on the right and 0.84 on the left  Assessment/Plan:  67 y.o. female, with hx carotid artery disease, COPD, CAD, HTN, HLD, DM, tobacco abuse that presents for evaluation of moderate loss of blood flow to legs.  Her main complaint is lower extremity pain both day and night even without exertion.  This is worse in the left leg and she has more pain around the knee today.  On exam she has easily palpable dorsalis pedis pulse in the left foot and I do not think her symptoms are entirely consistent with  arterial insufficiency.  She certainly has a number of risk factors for underlying PAD.  I have increased her Pletal to 100 mg twice a day to see if this helps.  I discussed walking therapies.  I will see her in 6 months with repeat ABIs and further follow-up.  Again discussed I do not think she merits intervention at this time and likely has other underlying etiologies for her leg pain that she states is worse at night.  With an ABI of 0.84 on the left and a palpable pulse this is not an ischemic presentation that would explain rest pain throughout the night.   Cephus Shelling, MD [facility] Office: [phone]

## 2023-06-02 DIAGNOSIS — F324 Major depressive disorder, single episode, in partial remission: Secondary | ICD-10-CM | POA: Diagnosis not present

## 2023-06-02 DIAGNOSIS — M545 Low back pain, unspecified: Secondary | ICD-10-CM | POA: Diagnosis not present

## 2023-06-02 DIAGNOSIS — D649 Anemia, unspecified: Secondary | ICD-10-CM | POA: Diagnosis not present

## 2023-06-02 DIAGNOSIS — R197 Diarrhea, unspecified: Secondary | ICD-10-CM | POA: Diagnosis not present

## 2023-06-02 DIAGNOSIS — R296 Repeated falls: Secondary | ICD-10-CM | POA: Diagnosis not present

## 2023-06-02 DIAGNOSIS — E1165 Type 2 diabetes mellitus with hyperglycemia: Secondary | ICD-10-CM | POA: Diagnosis not present

## 2023-06-02 DIAGNOSIS — I700 Atherosclerosis of aorta: Secondary | ICD-10-CM | POA: Diagnosis not present

## 2023-06-02 DIAGNOSIS — I739 Peripheral vascular disease, unspecified: Secondary | ICD-10-CM | POA: Diagnosis not present

## 2023-06-02 DIAGNOSIS — I10 Essential (primary) hypertension: Secondary | ICD-10-CM | POA: Diagnosis not present

## 2023-06-02 DIAGNOSIS — R627 Adult failure to thrive: Secondary | ICD-10-CM | POA: Diagnosis not present

## 2023-06-02 DIAGNOSIS — Z8673 Personal history of transient ischemic attack (TIA), and cerebral infarction without residual deficits: Secondary | ICD-10-CM | POA: Diagnosis not present

## 2023-06-05 DIAGNOSIS — I10 Essential (primary) hypertension: Secondary | ICD-10-CM | POA: Diagnosis not present

## 2023-06-05 DIAGNOSIS — D649 Anemia, unspecified: Secondary | ICD-10-CM | POA: Diagnosis not present

## 2023-06-05 DIAGNOSIS — E1165 Type 2 diabetes mellitus with hyperglycemia: Secondary | ICD-10-CM | POA: Diagnosis not present

## 2023-06-05 DIAGNOSIS — K219 Gastro-esophageal reflux disease without esophagitis: Secondary | ICD-10-CM | POA: Diagnosis not present

## 2023-06-05 DIAGNOSIS — E782 Mixed hyperlipidemia: Secondary | ICD-10-CM | POA: Diagnosis not present

## 2023-06-05 DIAGNOSIS — Z1329 Encounter for screening for other suspected endocrine disorder: Secondary | ICD-10-CM | POA: Diagnosis not present

## 2023-06-05 DIAGNOSIS — E7849 Other hyperlipidemia: Secondary | ICD-10-CM | POA: Diagnosis not present

## 2023-06-06 DIAGNOSIS — I10 Essential (primary) hypertension: Secondary | ICD-10-CM | POA: Diagnosis not present

## 2023-06-06 DIAGNOSIS — E0865 Diabetes mellitus due to underlying condition with hyperglycemia: Secondary | ICD-10-CM | POA: Diagnosis not present

## 2023-06-06 DIAGNOSIS — F17210 Nicotine dependence, cigarettes, uncomplicated: Secondary | ICD-10-CM | POA: Diagnosis not present

## 2023-06-06 DIAGNOSIS — E785 Hyperlipidemia, unspecified: Secondary | ICD-10-CM | POA: Diagnosis not present

## 2023-06-06 DIAGNOSIS — Z794 Long term (current) use of insulin: Secondary | ICD-10-CM | POA: Diagnosis not present

## 2023-06-06 DIAGNOSIS — E1165 Type 2 diabetes mellitus with hyperglycemia: Secondary | ICD-10-CM | POA: Diagnosis not present

## 2023-06-06 DIAGNOSIS — Z90710 Acquired absence of both cervix and uterus: Secondary | ICD-10-CM | POA: Diagnosis not present

## 2023-06-06 DIAGNOSIS — D649 Anemia, unspecified: Secondary | ICD-10-CM | POA: Diagnosis not present

## 2023-06-06 DIAGNOSIS — R799 Abnormal finding of blood chemistry, unspecified: Secondary | ICD-10-CM | POA: Diagnosis not present

## 2023-06-08 ENCOUNTER — Other Ambulatory Visit: Payer: Self-pay

## 2023-06-08 DIAGNOSIS — I739 Peripheral vascular disease, unspecified: Secondary | ICD-10-CM

## 2023-06-09 ENCOUNTER — Encounter: Payer: Medicare HMO | Admitting: Vascular Surgery

## 2023-06-10 DIAGNOSIS — F17210 Nicotine dependence, cigarettes, uncomplicated: Secondary | ICD-10-CM | POA: Diagnosis not present

## 2023-06-10 DIAGNOSIS — E1165 Type 2 diabetes mellitus with hyperglycemia: Secondary | ICD-10-CM | POA: Diagnosis not present

## 2023-06-10 DIAGNOSIS — F324 Major depressive disorder, single episode, in partial remission: Secondary | ICD-10-CM | POA: Diagnosis not present

## 2023-06-10 DIAGNOSIS — I700 Atherosclerosis of aorta: Secondary | ICD-10-CM | POA: Diagnosis not present

## 2023-06-10 DIAGNOSIS — M545 Low back pain, unspecified: Secondary | ICD-10-CM | POA: Diagnosis not present

## 2023-06-10 DIAGNOSIS — Z8673 Personal history of transient ischemic attack (TIA), and cerebral infarction without residual deficits: Secondary | ICD-10-CM | POA: Diagnosis not present

## 2023-06-10 DIAGNOSIS — I10 Essential (primary) hypertension: Secondary | ICD-10-CM | POA: Diagnosis not present

## 2023-06-10 DIAGNOSIS — D649 Anemia, unspecified: Secondary | ICD-10-CM | POA: Diagnosis not present

## 2023-06-10 DIAGNOSIS — R296 Repeated falls: Secondary | ICD-10-CM | POA: Diagnosis not present

## 2023-06-10 DIAGNOSIS — I739 Peripheral vascular disease, unspecified: Secondary | ICD-10-CM | POA: Diagnosis not present

## 2023-06-10 DIAGNOSIS — R627 Adult failure to thrive: Secondary | ICD-10-CM | POA: Diagnosis not present

## 2023-06-12 DIAGNOSIS — F332 Major depressive disorder, recurrent severe without psychotic features: Secondary | ICD-10-CM | POA: Diagnosis not present

## 2023-06-12 DIAGNOSIS — F17210 Nicotine dependence, cigarettes, uncomplicated: Secondary | ICD-10-CM | POA: Diagnosis not present

## 2023-06-12 DIAGNOSIS — E43 Unspecified severe protein-calorie malnutrition: Secondary | ICD-10-CM | POA: Diagnosis not present

## 2023-06-12 DIAGNOSIS — I5032 Chronic diastolic (congestive) heart failure: Secondary | ICD-10-CM | POA: Diagnosis not present

## 2023-06-12 DIAGNOSIS — Z515 Encounter for palliative care: Secondary | ICD-10-CM | POA: Diagnosis not present

## 2023-06-12 DIAGNOSIS — G8929 Other chronic pain: Secondary | ICD-10-CM | POA: Diagnosis not present

## 2023-06-13 DIAGNOSIS — R197 Diarrhea, unspecified: Secondary | ICD-10-CM | POA: Diagnosis not present

## 2023-06-23 IMAGING — MR MR SACRUM / SI JOINTS WO CM
5 series · 45 of 48 positions shown · non-contrast
Comparison: None.

CLINICAL DATA: Low back pain for 6 months

EXAM:
MRI SACRUM WITHOUT CONTRAST
TECHNIQUE: Multiplanar multi-sequence MR imaging of the sacrum was performed.
No intravenous contrast was administered.

[Series 4: T2 fat-sat · sagittal · 4.0mm · 1.02mm/px · 8 of 30 slices shown (1 of 2)]
[im 1/30]
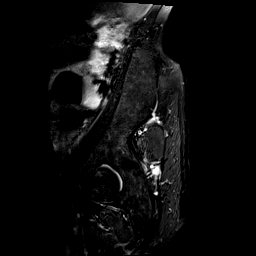
[im 5/30]
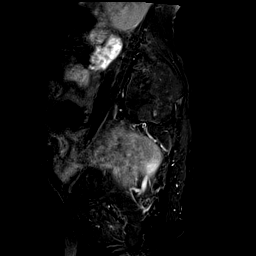
[im 9/30]
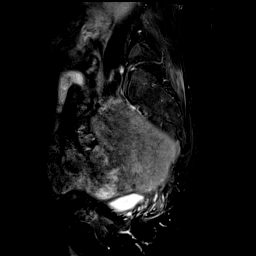
[im 13/30]
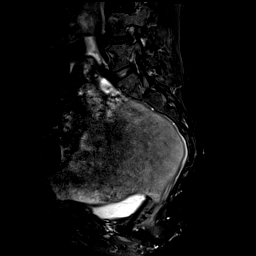
[im 17/30]
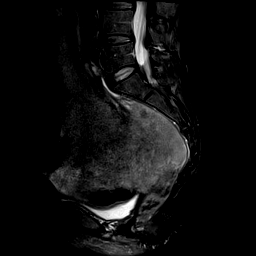
[im 21/30]
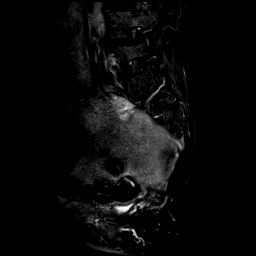
[im 25/30]
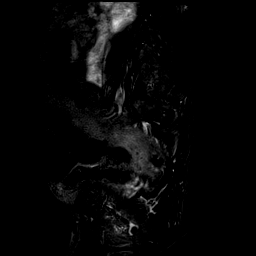
[im 30/30]
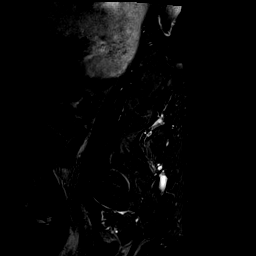

[Series 6: T2 fat-sat · axial · 4.0mm · 0.86mm/px · z∈[-150,+65]mm · 9 of 44 slices shown (2 of 2)]
[im 1/44]
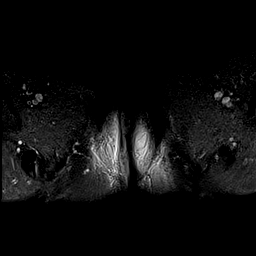
[im 4/44]
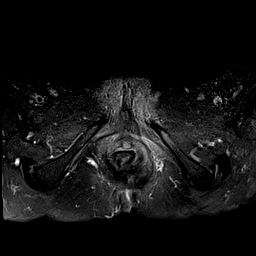
[im 8/44]
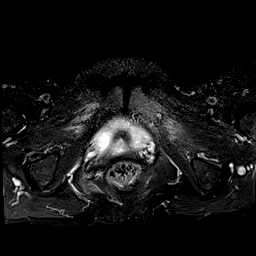
[im 12/44]
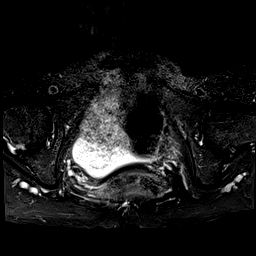
[im 20/44]
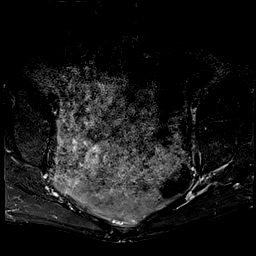
[im 24/44]
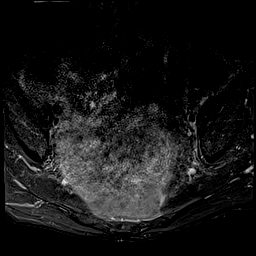
[im 32/44]
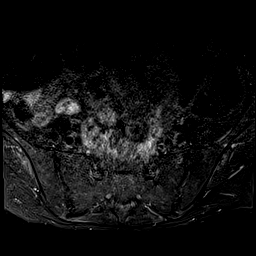
[im 36/44]
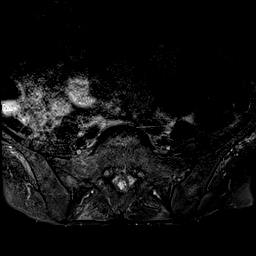
[im 44/44]
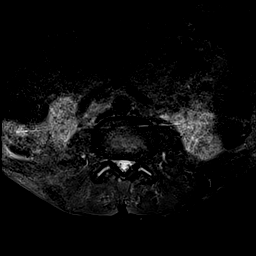

[Series 7: T1 · axial · 4.0mm · 0.86mm/px · z∈[-150,+65]mm · 12 of 44 slices shown (1 of 2)]
[im 1/44]
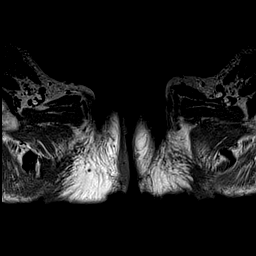
[im 4/44]
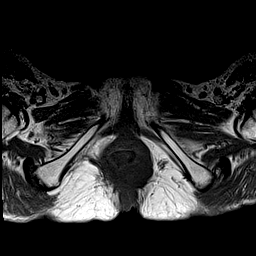
[im 8/44]
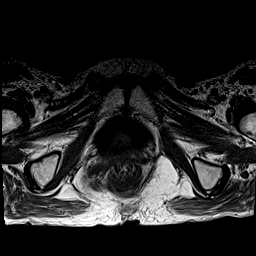
[im 12/44]
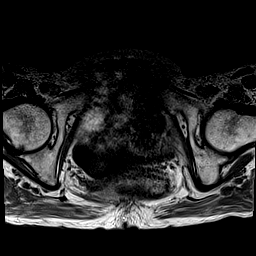
[im 16/44]
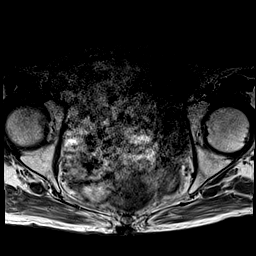
[im 20/44]
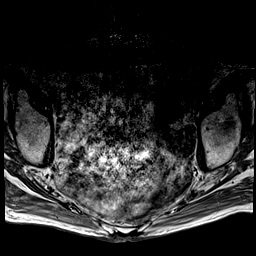
[im 24/44]
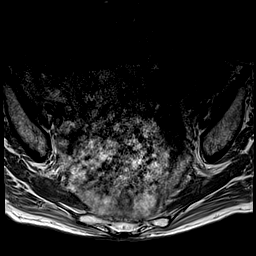
[im 28/44]
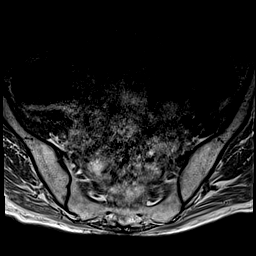
[im 32/44]
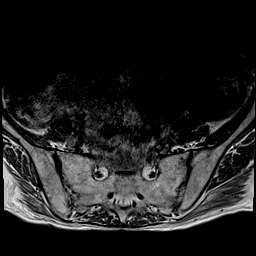
[im 36/44]
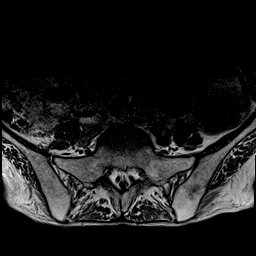
[im 40/44]
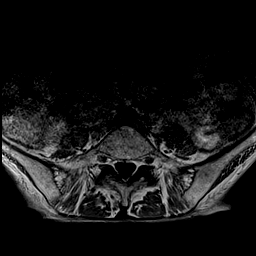
[im 44/44]
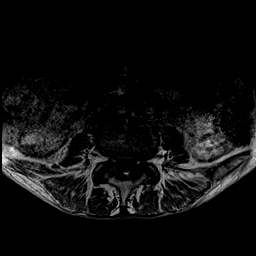

[Series 8: STIR · coronal · 3.0mm · 0.43mm/px · 8 of 28 slices shown]
[im 1/28]
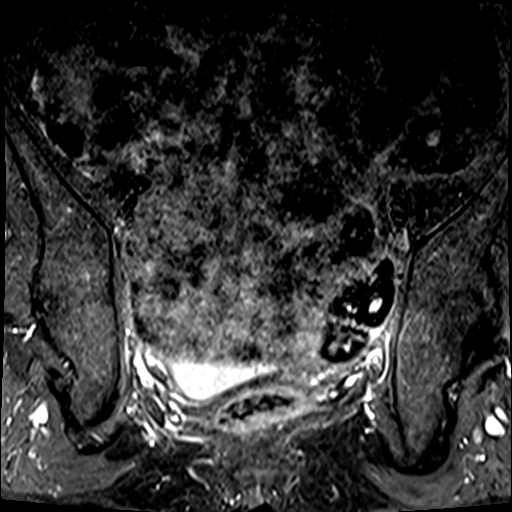
[im 4/28]
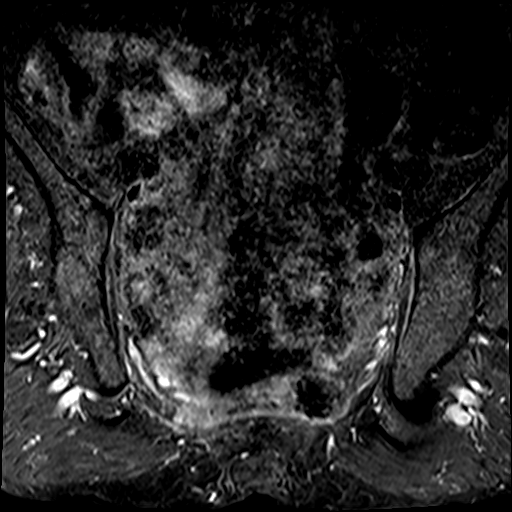
[im 8/28]
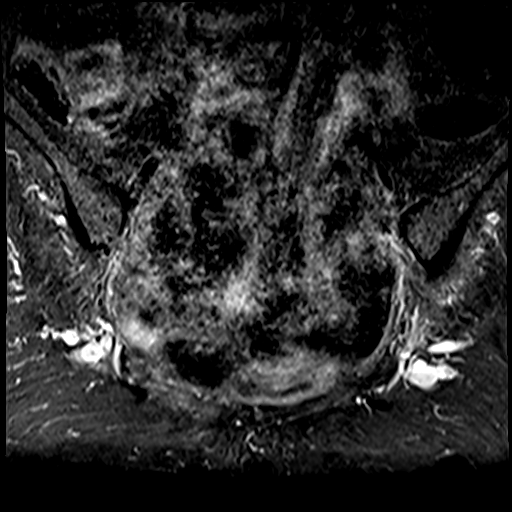
[im 12/28]
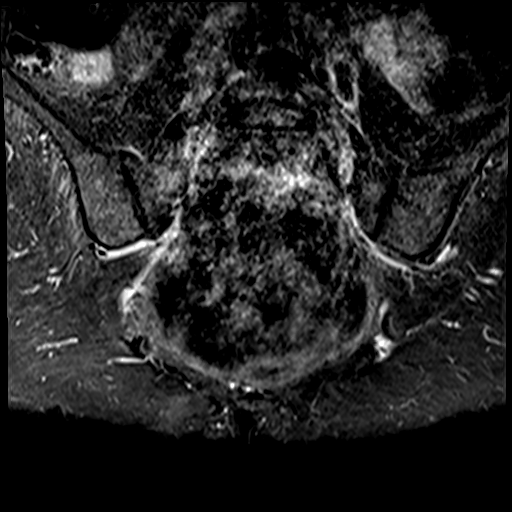
[im 16/28]
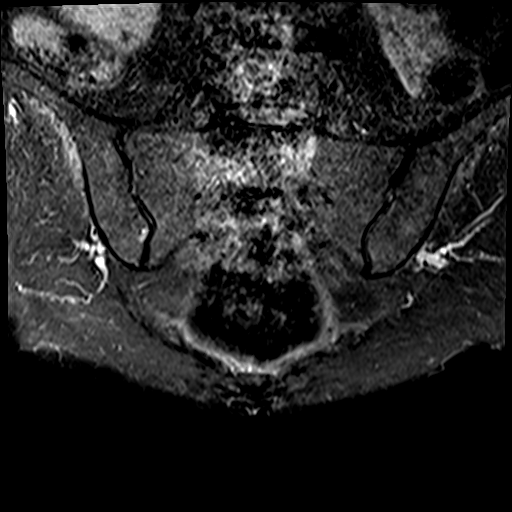
[im 20/28]
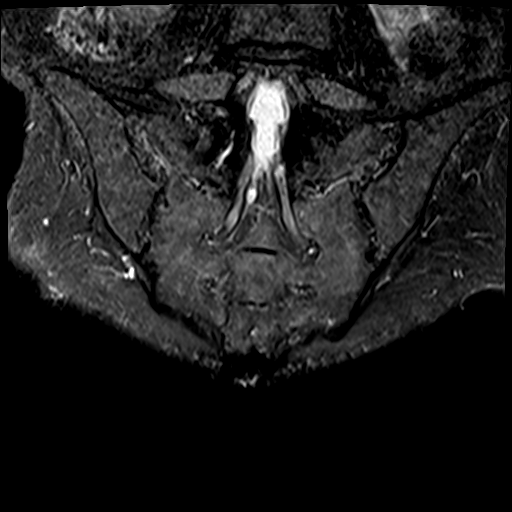
[im 24/28]
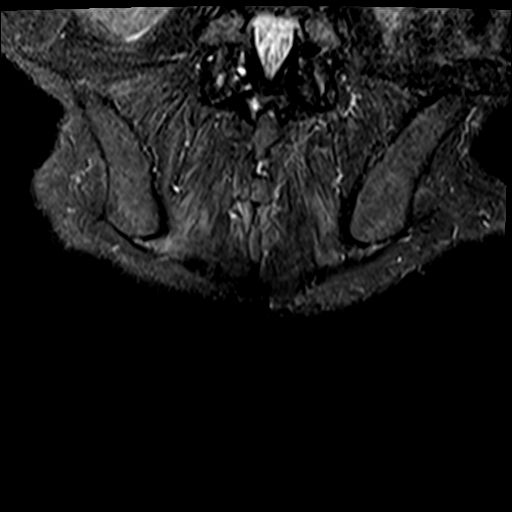
[im 28/28]
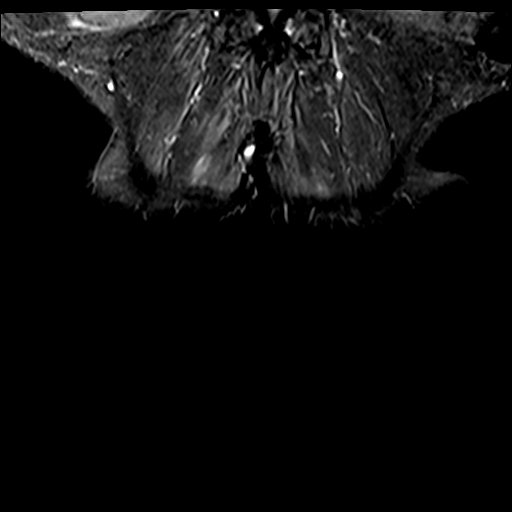

[Series 9: T1 · coronal · 3.0mm · 0.86mm/px · 8 of 28 slices shown (2 of 2)]
[im 1/28]
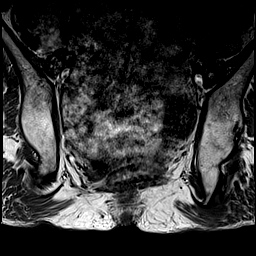
[im 4/28]
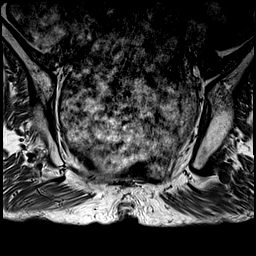
[im 8/28]
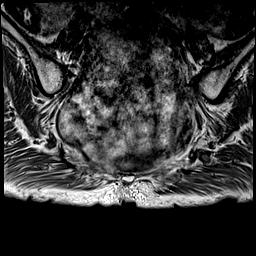
[im 12/28]
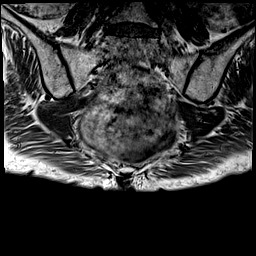
[im 16/28]
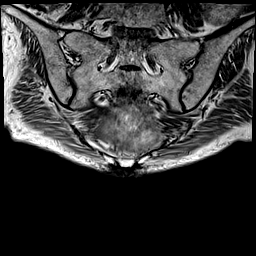
[im 20/28]
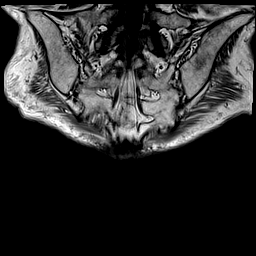
[im 24/28]
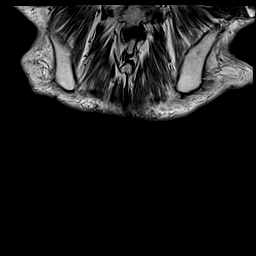
[im 28/28]
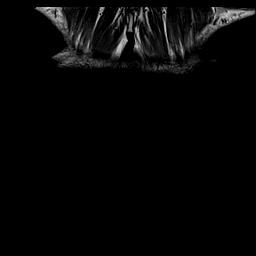

[45 of 48 positions shown; findings below may reference images not displayed]

FINDINGS: Bones/Joint/Cartilage

No fracture or dislocation. Normal alignment. No joint effusion. No
marrow signal abnormality.

No SI joint widening or erosive changes. No subchondral reactive
marrow changes. No SI joint effusion.

Mild broad-based disc bulge at L4-5. Disc spaces are maintained.
Mild disc desiccation at L3-4 and L4-5.

T2 hyperintense signal in the left inferior pubic ramus without T1
signal abnormality likely reflecting artifact from poor fat
saturation.

Ligaments, Muscles and Tendons

Mild muscle edema in the adductor magnus bilaterally which may
reflect mild muscle strain versus myositis. No muscle atrophy.
Piriformis muscles are normal bilaterally without signal
abnormality.

Soft tissue
No fluid collection or hematoma. No soft tissue mass. Normal
neurovascular bundles.
IMPRESSION: 1. No acute abnormality of the sacrum.  No evidence of sacroiliitis.
2. Mild muscle edema in the adductor magnus bilaterally which may
reflect mild muscle strain versus myositis.

## 2023-06-27 ENCOUNTER — Ambulatory Visit: Payer: Medicare HMO | Admitting: Gastroenterology

## 2023-06-27 ENCOUNTER — Encounter: Payer: Self-pay | Admitting: Gastroenterology

## 2023-06-27 ENCOUNTER — Other Ambulatory Visit (HOSPITAL_COMMUNITY)
Admission: RE | Admit: 2023-06-27 | Discharge: 2023-06-27 | Disposition: A | Discharge status: Home / Self Care | Payer: Medicare HMO | Source: Ambulatory Visit | Attending: Gastroenterology | Admitting: Gastroenterology

## 2023-06-27 ENCOUNTER — Ambulatory Visit (HOSPITAL_COMMUNITY)
Admission: RE | Admit: 2023-06-27 | Discharge: 2023-06-27 | Disposition: A | Discharge status: Home / Self Care | Payer: Medicare HMO | Source: Ambulatory Visit | Attending: Gastroenterology | Admitting: Gastroenterology

## 2023-06-27 VITALS — BP 145/85 | HR 105 | Temp 97.3°F | Ht 62.0 in | Wt 85.0 lb

## 2023-06-27 DIAGNOSIS — R748 Abnormal levels of other serum enzymes: Secondary | ICD-10-CM

## 2023-06-27 DIAGNOSIS — R1915 Other abnormal bowel sounds: Secondary | ICD-10-CM

## 2023-06-27 DIAGNOSIS — R1030 Lower abdominal pain, unspecified: Secondary | ICD-10-CM | POA: Insufficient documentation

## 2023-06-27 DIAGNOSIS — K529 Noninfective gastroenteritis and colitis, unspecified: Secondary | ICD-10-CM

## 2023-06-27 DIAGNOSIS — R630 Anorexia: Secondary | ICD-10-CM

## 2023-06-27 DIAGNOSIS — R197 Diarrhea, unspecified: Secondary | ICD-10-CM | POA: Diagnosis not present

## 2023-06-27 LAB — CBC WITH DIFFERENTIAL/PLATELET
Abs Immature Granulocytes: 0.02 10*3/uL (ref 0.00–0.07)
Basophils Absolute: 0 10*3/uL (ref 0.0–0.1)
Basophils Relative: 1 %
Eosinophils Absolute: 0.1 10*3/uL (ref 0.0–0.5)
Eosinophils Relative: 1 %
HCT: 31.5 % — ABNORMAL LOW (ref 36.0–46.0)
Hemoglobin: 9.9 g/dL — ABNORMAL LOW (ref 12.0–15.0)
Immature Granulocytes: 0 %
Lymphocytes Relative: 18 %
Lymphs Abs: 1.1 10*3/uL (ref 0.7–4.0)
MCH: 30 pg (ref 26.0–34.0)
MCHC: 31.4 g/dL (ref 30.0–36.0)
MCV: 95.5 fL (ref 80.0–100.0)
Monocytes Absolute: 0.6 10*3/uL (ref 0.1–1.0)
Monocytes Relative: 10 %
Neutro Abs: 4.5 10*3/uL (ref 1.7–7.7)
Neutrophils Relative %: 70 %
Platelets: 367 10*3/uL (ref 150–400)
RBC: 3.3 MIL/uL — ABNORMAL LOW (ref 3.87–5.11)
RDW: 13.2 % (ref 11.5–15.5)
WBC: 6.4 10*3/uL (ref 4.0–10.5)
nRBC: 0 % (ref 0.0–0.2)

## 2023-06-27 LAB — COMPREHENSIVE METABOLIC PANEL
ALT: 28 U/L (ref 0–44)
AST: 21 U/L (ref 15–41)
Albumin: 3.5 g/dL (ref 3.5–5.0)
Alkaline Phosphatase: 182 U/L — ABNORMAL HIGH (ref 38–126)
Anion gap: 13 (ref 5–15)
BUN: 39 mg/dL — ABNORMAL HIGH (ref 8–23)
CO2: 19 mmol/L — ABNORMAL LOW (ref 22–32)
Calcium: 9.3 mg/dL (ref 8.9–10.3)
Chloride: 102 mmol/L (ref 98–111)
Creatinine, Ser: 1.25 mg/dL — ABNORMAL HIGH (ref 0.44–1.00)
GFR, Estimated: 47 mL/min — ABNORMAL LOW (ref >60)
Glucose, Bld: 302 mg/dL — ABNORMAL HIGH (ref 70–99)
Potassium: 3.7 mmol/L (ref 3.5–5.1)
Sodium: 134 mmol/L — ABNORMAL LOW (ref 135–145)
Total Bilirubin: 0.4 mg/dL (ref <1.2)
Total Protein: 6.8 g/dL (ref 6.5–8.1)

## 2023-06-27 LAB — T4, FREE: Free T4: 1.03 ng/dL (ref 0.61–1.12)

## 2023-06-27 LAB — GAMMA GT: GGT: 29 U/L (ref 7–50)

## 2023-06-27 LAB — TSH: TSH: 3.246 u[IU]/mL (ref 0.350–4.500)

## 2023-06-27 LAB — C-REACTIVE PROTEIN: CRP: 0.7 mg/dL (ref <1.0)

## 2023-06-27 LAB — LIPASE, BLOOD: Lipase: 29 U/L (ref 11–51)

## 2023-06-27 NOTE — Progress Notes (Signed)
GI Office Note    Referring Provider: Estanislado Pandy, MD Primary Care Physician:  Estanislado Pandy, MD  Primary Gastroenterologist: Hennie Duos. Marletta Lor, DO   Chief Complaint   Chief Complaint  Patient presents with   Diarrhea    Pt says has been going on for months. Every morning going 4-5 times and it's just like water. No blood or mucus in stools.    History of Present Illness   Ashlee Austin is a 67 y.o. female presenting today at the request of Roma Kayser, PA-C for diarrhea.   Last seen 09/2020 for epigastric pain, N/V, weight loss, CRC screening at that time.   Labs 05/2023: GI pathogen panel and Cdiff PCR both negative. Na 132, K 4.2, Cre 1.48, glucose 462, Tbili 0.2, AP 426, AST 18, ALT 63 normal. WBC 7.7, Hgb 11L, hct 33.4L, MCV 93, Platelets 360  Today: several month history of watery stools, 4-5 times every morning. No blood or mucous in the stool. Symptoms initially started with constipation.  Now however passing nothing but watery stool.  No rectal pressure or discomfort.  Lower abdominal discomfort intermittently, not daily.  Often worse after a bowel movement.  Not necessarily related to meals.  Notes that she has a lot of gurgling throughout her stomach most of the day.  Has a lot of stool urgency.  Denies any rectal bleeding.  Stools are dark on iron.  Waking up at 3am with diarrhea.  Typically has 4-5 stools daily.  Sometimes later in the afternoon as well.  Poor appetite. Some nausea.  No vomiting.  PCP stopped her metformin but this did not seem to make any difference in her diarrhea.  She has tried taking Imodium 2 in the morning without relief.  Tried Lomotil as well.  No heartburn or dysphagia.  She does have some renal insufficiency, does not see nephrology.  She is also taking magnesium twice daily but she is not sure how long she has been on these.    CT chest/abd/pelvis without contrast 11/2022: 1. No acute intrathoracic or intra-abdominal pathology  identified.  No definite radiographic explanation for the patient's reported  symptoms.  2. Extensive multi-vessel coronary artery calcification.  3. Mild emphysema.  4. Minimal right nonobstructing nephrolithiasis.  5. Moderate colonic stool burden without evidence of obstruction.  6. Extensive aortoiliac atherosclerotic calcification. Particularly  prominent atherosclerotic calcification is seen at the renal and  mesenteric arterial origins, however, the degree of stenosis is not  well assessed on this noncontrast examination. If there is clinical  evidence of chronic mesenteric ischemia or hemodynamically  significant renal artery stenosis, CT arteriography may be more  helpful for further evaluation.  7. 5 mm pulmonary nodule within the subpleural right lower lobe is  new from prior examination, axial image # 74/4, and is  indeterminate. Short-term follow-up in 6 months is recommended with  repeat low-dose chest CT without contrast (please use the following  order, "CT CHEST LCS NODULE FOLLOW-UP W/O CM").   Colonoscopy 10/2020: -non-bleeding internal hemorrhoids -diverticulosis -three 4-19mm polyps in transverse , tubular adenoma -next colonoscopy five years.   EGD 10/2020: -non-severe candidiasis esophagitis with no bleeding. Cells for cytology positive for candida, treated -gastritis, reactive gastropathy, no h.pylori   Wt Readings from Last 3 Encounters:  06/27/23 85 lb (38.6 kg)  05/23/23 83 lb (37.6 kg)  03/31/23 80 lb (36.3 kg)    Medications   Current Outpatient Medications  Medication Sig Dispense Refill  albuterol (VENTOLIN HFA) 108 (90 Base) MCG/ACT inhaler Inhale 2 puffs into the lungs every 4 (four) hours as needed.     aspirin EC 81 MG tablet Take 1 tablet (81 mg total) by mouth daily. Swallow whole. 90 tablet 1   atorvastatin (LIPITOR) 80 MG tablet Take 80 mg by mouth at bedtime.     BD PEN NEEDLE NANO 2ND GEN 32G X 4 MM MISC USE AS DIRECTED EVERY DAY      cilostazol (PLETAL) 100 MG tablet Take 1 tablet (100 mg total) by mouth 2 (two) times daily before a meal. (Patient taking differently: Take 50 mg by mouth 2 (two) times daily before a meal.) 60 tablet 11   Cyanocobalamin (VITAMIN B12 PO) Take 1 tablet by mouth daily.     ENTRESTO 24-26 MG Take 1 tablet by mouth 2 (two) times daily.     feeding supplement (ENSURE ENLIVE / ENSURE PLUS) LIQD Take 237 mLs by mouth 3 (three) times daily between meals. 237 mL 12   FEROSUL 325 (65 Fe) MG tablet Take 325 mg by mouth 2 (two) times daily with a meal.     glipiZIDE (GLUCOTROL XL) 5 MG 24 hr tablet Take 5 mg by mouth daily.     LANTUS SOLOSTAR 100 UNIT/ML Solostar Pen SMARTSIG:10 Unit(s) SUB-Q Daily     metoprolol succinate (TOPROL-XL) 25 MG 24 hr tablet Take 25 mg by mouth daily.     Multiple Vitamin (MULTIVITAMIN WITH MINERALS) TABS tablet Take 1 tablet by mouth daily.     oxyCODONE (OXY IR/ROXICODONE) 5 MG immediate release tablet Take 5 mg by mouth every 8 (eight) hours as needed.     pantoprazole (PROTONIX) 40 MG tablet TAKE 1 TABLET(40 MG) BY MOUTH TWICE DAILY 60 tablet 5   Probiotic Product (PROBIOTIC PO) Take 1 tablet by mouth daily.     No current facility-administered medications for this visit.    Allergies   Allergies as of 06/27/2023 - Review Complete 06/27/2023  Allergen Reaction Noted   Codeine Itching 03/31/2023      Review of Systems   General: Negative for weight loss, fever, chills, fatigue, weakness.  See HPI ENT: Negative for hoarseness, difficulty swallowing , nasal congestion. CV: Negative for chest pain, angina, palpitations, dyspnea on exertion, peripheral edema.  Respiratory: Negative for dyspnea at rest, dyspnea on exertion, cough, sputum, wheezing.  GI: See history of present illness. GU:  Negative for dysuria, hematuria, urinary incontinence, urinary frequency, nocturnal urination.  Endo: Negative for unusual weight change.     Physical Exam   BP (!) 145/85 (BP  Location: Right Arm, Patient Position: Sitting, Cuff Size: Normal)   Pulse (!) 105   Temp (!) 97.3 F (36.3 C) (Temporal)   Ht 5\' 2"  (1.575 m)   Wt 85 lb (38.6 kg)   BMI 15.55 kg/m    General: Thin female, appears older than stated age.  No acute distress. Eyes: No icterus. Mouth: Oropharyngeal mucosa moist and pink   Abdomen: Bowel sounds are hyperactive, somewhat tympanic dysphagia in the lower abdomen.  Slight lower abdominal distention.  Nontender exam.  No masses or hernia.  No hepatosplenomegaly.    Extremities: No lower extremity edema. No clubbing or deformities. Neuro: Alert and oriented x 4   Skin: Warm and dry, no jaundice.   Psych: Alert and cooperative, normal mood and affect.  Labs   See HPI Imaging Studies   No results found.  Assessment/plan   Chronic diarrhea: Possibly multifactorial, cannot exclude  side effect from medication or supplement.  Stool studies negative for C. difficile.  GI pathogen panel negative.  Cannot exclude possibility of EPI (exocrine pancreatic insufficiency).  Cannot chronic mesenteric ischemia.  IBD less likely.  Microscopic colitis remains a possibility as well. -Abdominal x-ray today due to the distention and tympanic bowel sounds, rule out ileus, colonic distention and obstruction less likely -Update labs, screen for celiac, inflammatory markers, thyroid function, check renal function and electrolytes -Consider holding magnesium for 1 week to see if that makes any difference -Consider CTA abdomen/pelvis if renal function allows -Trial of Creon 2 with meals and 1 with snacks  Elevated alkaline phosphatase in the 400 range recently, elevated on a couple of occasions -GGT, mitochondrial antibodies, repeat LFTs      Leanna Battles. Melvyn Neth, MHS, PA-C [facility]

## 2023-06-27 NOTE — Patient Instructions (Addendum)
Please complete labs at [facility] today when you go for abdominal xray.   Try creon (36,000 units) 2 capsules with each meal and 1 capsule with each snack, no more than 8 per day. Samples provided.  Patient aware to take capsules while eating, not before or after.  Have your daughter in law double check medication list for accuracy. Let us know if we need to make any corrections.   Magnesium can contribute to diarrhea, you could consider holding for a week to see if makes a difference.

## 2023-06-29 LAB — MITOCHONDRIAL ANTIBODIES: Mitochondrial M2 Ab, IgG: <20 U (ref 0.0–20.0)

## 2023-06-29 LAB — TISSUE TRANSGLUTAMINASE, IGA: Tissue Transglutaminase Ab, IgA: <2 U/mL (ref 0–3)

## 2023-07-06 ENCOUNTER — Other Ambulatory Visit: Payer: Self-pay

## 2023-07-06 DIAGNOSIS — D509 Iron deficiency anemia, unspecified: Secondary | ICD-10-CM

## 2023-07-06 DIAGNOSIS — R748 Abnormal levels of other serum enzymes: Secondary | ICD-10-CM

## 2023-07-07 NOTE — Addendum Note (Signed)
Addended by: Zada Finders on: 07/07/2023 08:49 AM   Modules accepted: Orders

## 2023-07-13 ENCOUNTER — Telehealth: Payer: Self-pay

## 2023-07-13 MED ORDER — PANCRELIPASE (LIP-PROT-AMYL) 36000-114000 UNITS PO CPEP: ORAL_CAPSULE | ORAL | 5 refills | Status: DC

## 2023-07-13 NOTE — Telephone Encounter (Signed)
Pt states that she will wait until Verlon Au returns for her to send it to pharmacy.

## 2023-07-13 NOTE — Telephone Encounter (Signed)
Rx done. 

## 2023-07-13 NOTE — Telephone Encounter (Signed)
Pt called stating that she is almost out of the samples of Creon and that is has helped. Pt is wanting to know if you are wanting her to continue taking it and if so an Rx needs to be sent to her pharmacy.

## 2023-07-13 NOTE — Addendum Note (Signed)
Addended by: Tiffany Kocher on: 07/13/2023 04:02 PM   Modules accepted: Orders

## 2023-07-17 ENCOUNTER — Other Ambulatory Visit: Payer: Self-pay

## 2023-07-17 ENCOUNTER — Telehealth: Payer: Self-pay

## 2023-07-17 MED ORDER — PANCRELIPASE (LIP-PROT-AMYL) 36000-114000 UNITS PO CPEP: ORAL_CAPSULE | ORAL | 5 refills | Status: DC

## 2023-07-17 NOTE — Telephone Encounter (Signed)
error 

## 2023-07-17 NOTE — Telephone Encounter (Signed)
Pt was made aware and verbalized understanding.  

## 2023-07-26 LAB — CBC WITH DIFFERENTIAL/PLATELET
Basophils Absolute: 0 10*3/uL (ref 0.0–0.2)
Basos: 0 %
EOS (ABSOLUTE): 0.1 10*3/uL (ref 0.0–0.4)
Eos: 1 %
Hematocrit: 33.3 % — ABNORMAL LOW (ref 34.0–46.6)
Hemoglobin: 10.3 g/dL — ABNORMAL LOW (ref 11.1–15.9)
Immature Grans (Abs): 0 10*3/uL (ref 0.0–0.1)
Immature Granulocytes: 0 %
Lymphocytes Absolute: 1.2 10*3/uL (ref 0.7–3.1)
Lymphs: 14 %
MCH: 29.7 pg (ref 26.6–33.0)
MCHC: 30.9 g/dL — ABNORMAL LOW (ref 31.5–35.7)
MCV: 96 fL (ref 79–97)
Monocytes Absolute: 0.6 10*3/uL (ref 0.1–0.9)
Monocytes: 7 %
Neutrophils Absolute: 6.5 10*3/uL (ref 1.4–7.0)
Neutrophils: 78 %
Platelets: 392 10*3/uL (ref 150–450)
RBC: 3.47 x10E6/uL — ABNORMAL LOW (ref 3.77–5.28)
RDW: 12.2 % (ref 11.7–15.4)
WBC: 8.4 10*3/uL (ref 3.4–10.8)

## 2023-07-26 LAB — B12 AND FOLATE PANEL
Folate: >20 ng/mL (ref >3.0)
Vitamin B-12: >2000 pg/mL — ABNORMAL HIGH (ref 232–1245)

## 2023-07-26 LAB — IRON,TIBC AND FERRITIN PANEL
Ferritin: 128 ng/mL (ref 15–150)
Iron Saturation: 16 % (ref 15–55)
Iron: 44 ug/dL (ref 27–139)
Total Iron Binding Capacity: 275 ug/dL (ref 250–450)
UIBC: 231 ug/dL (ref 118–369)

## 2023-07-26 LAB — PRIMARY BILIARY CHOLANGITIS PR
Anti-GP-210 Ab (RDL): <20 U (ref <20)
Anti-Mitochondrial M2 Ab (RDL): <20 U (ref <20)
Anti-Nuclear Ab by IFA (RDL): POSITIVE — AB
Anti-SP-100 Ab (RDL): <20 U (ref <20)

## 2023-07-26 LAB — ANA TITER AND PATTERN: Speckled Pattern: 1:80 {titer} — ABNORMAL HIGH

## 2023-07-26 LAB — ALKALINE PHOSPHATASE, ISOENZYMES
Alkaline Phosphatase: 212 [IU]/L — ABNORMAL HIGH (ref 44–121)
BONE FRACTION: 79 % — ABNORMAL HIGH (ref 14–68)
INTESTINAL FRAC.: 0 % (ref 0–18)
LIVER FRACTION: 21 % (ref 18–85)

## 2023-07-26 LAB — MAGNESIUM: Magnesium: 1.6 mg/dL (ref 1.6–2.3)

## 2023-07-31 ENCOUNTER — Ambulatory Visit: Payer: Medicare HMO | Admitting: Internal Medicine

## 2023-08-02 ENCOUNTER — Ambulatory Visit: Payer: Medicare HMO | Attending: Internal Medicine | Admitting: Internal Medicine

## 2023-08-02 ENCOUNTER — Encounter: Payer: Self-pay | Admitting: Internal Medicine

## 2023-08-02 VITALS — BP 120/80 | HR 100 | Ht 62.0 in | Wt 87.4 lb

## 2023-08-02 DIAGNOSIS — I5022 Chronic systolic (congestive) heart failure: Secondary | ICD-10-CM

## 2023-08-02 DIAGNOSIS — I739 Peripheral vascular disease, unspecified: Secondary | ICD-10-CM

## 2023-08-02 DIAGNOSIS — R296 Repeated falls: Secondary | ICD-10-CM | POA: Diagnosis not present

## 2023-08-02 DIAGNOSIS — F17200 Nicotine dependence, unspecified, uncomplicated: Secondary | ICD-10-CM

## 2023-08-02 NOTE — Patient Instructions (Signed)

## 2023-08-02 NOTE — Progress Notes (Signed)
 Cardiology Office Note  Date: 08/02/2023   ID: Ashlee Austin, DOB 02-27-1956, MRN [id number]  PCP:  Lizabeth Riggs, PA-C  Cardiologist:  Teddie Favre, MD Electrophysiologist:  None   History of Present Illness: Ashlee Austin is a 68 y.o. female known to have chronic systolic heart failure LVEF 30 to 35% in 5/24 that improved to 40 to 45% in 10/24, history of stroke in 2021, PAD, HTN, COPD is here for follow-up visit.  Upon chart review, patient was newly diagnosed with cardiomyopathy LVEF 3035% at [facility] in 5/24.  She had multiple ER visits for frequent falls due to severe protein calorie malnutrition, poor p.o. intake of fluids and diet.  She lost her husband in 12 years ago and lost any desire to take care of herself.  She does have help at home, cleaner comes to clean her house once a month, her daughter-in-law helps her with groceries.  Patient sometimes cooks and mostly depends on the microwave food.  Patient is here for follow-up visit.  Repeat echocardiogram in October 24 showed improved LVEF from 30 to 35% in 5/24 to 40 to 45% in 10/24.  She denies having any symptoms of angina, DOE, orthopnea, PND, leg swelling.  No dizziness, syncope or palpitations.  Claudication resolved after starting Pletal .  Past Medical History:  Diagnosis Date   Carotid artery disease (HCC)    COPD (chronic obstructive pulmonary disease) (HCC)    Coronary atherosclerosis of native coronary artery    [facility]; negative Dobutamine echo 6/11   Depression    Essential hypertension    Hyperlipidemia    Hypothyroidism    Stroke (HCC) 2020   Stroke [facility]) 2014   Type 2 diabetes mellitus (HCC)     Past Surgical History:  Procedure Laterality Date   BIOPSY  10/20/2020   Procedure: BIOPSY;  Surgeon: Vinetta Greening, DO;  Location: [facility];  Service: Endoscopy;;   CARPAL TUNNEL RELEASE     COLONOSCOPY WITH PROPOFOL  N/A 10/20/2020   Procedure: COLONOSCOPY WITH PROPOFOL ;  Surgeon: Vinetta Greening, DO;  Location: [facility];  Service: Endoscopy;  Laterality: N/A;  pm (early as possible), diabetic   CYSTECTOMY     Spine   ESOPHAGOGASTRODUODENOSCOPY (EGD) WITH PROPOFOL  N/A 10/20/2020   Procedure: ESOPHAGOGASTRODUODENOSCOPY (EGD) WITH PROPOFOL ;  Surgeon: Vinetta Greening, DO;  Location: [facility];  Service: Endoscopy;  Laterality: N/A;   TOTAL ABDOMINAL HYSTERECTOMY  2004   TYMPANOPLASTY      Current Outpatient Medications  Medication Sig Dispense Refill   albuterol (VENTOLIN HFA) 108 (90 Base) MCG/ACT inhaler Inhale 2 puffs into the lungs every 4 (four) hours as needed.     ascorbic acid (VITAMIN C) 500 MG tablet Take 500 mg by mouth daily.     aspirin  EC 81 MG tablet Take 1 tablet (81 mg total) by mouth daily. Swallow whole. 90 tablet 1   atorvastatin (LIPITOR) 80 MG tablet Take 80 mg by mouth at bedtime.     BD PEN NEEDLE NANO 2ND GEN 32G X 4 MM MISC USE AS DIRECTED EVERY DAY     Cholecalciferol (VITAMIN D3) 10 MCG (400 UNIT) CAPS Take 1 capsule by mouth daily.     cilostazol  (PLETAL ) 100 MG tablet Take 1 tablet (100 mg total) by mouth 2 (two) times daily before a meal. (Patient taking differently: Take 50 mg by mouth 2 (two) times daily before a meal.) 60 tablet 11   Cyanocobalamin  (VITAMIN B12  PO) Take 1 tablet by mouth daily.     DULoxetine (CYMBALTA) 60 MG capsule Take 60 mg by mouth daily.     ENTRESTO 24-26 MG Take 1 tablet by mouth 2 (two) times daily.     feeding supplement (ENSURE ENLIVE / ENSURE PLUS) LIQD Take 237 mLs by mouth 3 (three) times daily between meals. 237 mL 12   FEROSUL 325 (65 Fe) MG tablet Take 325 mg by mouth 2 (two) times daily with a meal.     glipiZIDE (GLUCOTROL XL) 5 MG 24 hr tablet Take 5 mg by mouth daily.     LANTUS  SOLOSTAR 100 UNIT/ML Solostar Pen SMARTSIG:10 Unit(s) SUB-Q Daily     lipase/protease/amylase (CREON ) 36000 UNITS CPEP capsule Take two capsules with meals and 1 capsule with snacks, up to 8 capsules daily 240 capsule 5    magnesium  oxide (MAG-OX) 400 (240 Mg) MG tablet Take 400 mg by mouth 2 (two) times daily.     metoprolol succinate (TOPROL-XL) 25 MG 24 hr tablet Take 25 mg by mouth daily.     Multiple Vitamin (MULTIVITAMIN WITH MINERALS) TABS tablet Take 1 tablet by mouth daily.     oxyCODONE (OXY IR/ROXICODONE) 5 MG immediate release tablet Take 5 mg by mouth every 8 (eight) hours as needed.     pantoprazole  (PROTONIX ) 40 MG tablet TAKE 1 TABLET(40 MG) BY MOUTH TWICE DAILY 60 tablet 5   Probiotic Product (PROBIOTIC PO) Take 1 tablet by mouth daily.     No current facility-administered medications for this visit.   Allergies:  Codeine   Social History: The patient  reports that she has been smoking cigarettes. She started smoking about 51 years ago. She has a 47.7 pack-year smoking history. She has never used smokeless tobacco. She reports that she does not drink alcohol  and does not use drugs.   Family History: The patient's family history includes Coronary artery disease in her mother; Diabetes in her mother and sister; Heart attack in her mother; Lung cancer in her father.   ROS:  Please see the history of present illness. Otherwise, complete review of systems is positive for none  All other systems are reviewed and negative.   Physical Exam: VS:  There were no vitals taken for this visit., BMI There is no height or weight on file to calculate BMI.  Wt Readings from Last 3 Encounters:  06/27/23 85 lb (38.6 kg)  05/23/23 83 lb (37.6 kg)  03/31/23 80 lb (36.3 kg)    General: Patient appears comfortable at rest. HEENT: Conjunctiva and lids normal, oropharynx clear with moist mucosa. Neck: Supple, no elevated JVP or carotid bruits, no thyromegaly. Lungs: Clear to auscultation, nonlabored breathing at rest. Cardiac: Regular rate and rhythm, no S3 or significant systolic murmur, no pericardial rub. Abdomen: Soft, nontender, no hepatomegaly, bowel sounds present, no guarding or rebound. Extremities: No  pitting edema Skin: Warm and dry. Musculoskeletal: No kyphosis. Neuropsychiatric: Alert and oriented x3, affect grossly appropriate.  Recent Labwork: 06/27/2023: ALT 28; AST 21; BUN 39; Creatinine, Ser 1.25; Potassium 3.7; Sodium 134; TSH 3.246 07/10/2023: Hemoglobin 10.3; Magnesium  1.6; Platelets 392     Component Value Date/Time   CHOL 99 03/11/2022 0047   TRIG 129 03/11/2022 0047   HDL 28 (L) 03/11/2022 0047   CHOLHDL 3.5 03/11/2022 0047   VLDL 26 03/11/2022 0047   LDLCALC 45 03/11/2022 0047      Assessment and Plan:  HFimpEF (30 to 35% in 5/24 at [facility] that improved to  40 to 45% in 10/24): Unclear etiology, BMI is 15.99, improved from 14.63.  Continue GDMT with metoprolol succinate 25 mg once daily and Entresto 24-26 mg twice daily.  Drinks only 16 ounces of water daily and today her HR is mildly elevated. Will uptitrate GDMT after she is adequately hydrated.  Will defer ischemia evaluation due to absence of symptoms.  Bilateral lower EXTR PAD (R ABI 0.79 and L ABI 0.84), claudication in RLE> LLE: Claudication free after starting Pletal .  Continue Pletal  50 mg twice daily.  No palpitations.  Mildly elevated HR today.  Continue cardioprotective occasions, aspirin  81 mg once daily.  Not on statin due to normal LDL levels.  History of CVA in 2021: Not embolic in etiology, TEE was not indicated.  She was supposed to get a 2-week event monitor but never underwent this study.  As it has been more than 3 years, will defer event monitor at this time.  Continue cardiovert medications, aspirin  81 mg once daily and not on statin due to normal LDL levels. Echocardiogram showed no interatrial communication.  Nicotine abuse: Smokes currently, 1 pack/day.  Has not cut back on smoking.  Smoking cessation counseling provided.  History of frequent falls and severe protein calorie malnutrition: BMI improved from 14.63 to 15.99 today.  No more falls.  Encouraged p.o. hydration.  Currently drinks only 16  ounces of water daily.    Medication Adjustments/Labs and Tests Ordered: Current medicines are reviewed at length with the patient today.  Concerns regarding medicines are outlined above.    Disposition:  Follow up 6 months  Signed Kelita Wallis Priya Askari Kinley, MD, 08/02/2023 1:10 PM    [facility] at [facility] [address] [place], [place], [place] [address]

## 2023-08-08 ENCOUNTER — Encounter: Payer: Self-pay | Admitting: Gastroenterology

## 2023-08-08 ENCOUNTER — Ambulatory Visit (INDEPENDENT_AMBULATORY_CARE_PROVIDER_SITE_OTHER): Payer: Medicare HMO | Admitting: Gastroenterology

## 2023-08-08 ENCOUNTER — Telehealth: Payer: Self-pay | Admitting: *Deleted

## 2023-08-08 VITALS — BP 148/86 | HR 91 | Temp 98.1°F | Ht 62.0 in | Wt 87.0 lb

## 2023-08-08 DIAGNOSIS — R197 Diarrhea, unspecified: Secondary | ICD-10-CM | POA: Diagnosis not present

## 2023-08-08 DIAGNOSIS — R748 Abnormal levels of other serum enzymes: Secondary | ICD-10-CM | POA: Diagnosis not present

## 2023-08-08 DIAGNOSIS — R1013 Epigastric pain: Secondary | ICD-10-CM | POA: Diagnosis not present

## 2023-08-08 DIAGNOSIS — D649 Anemia, unspecified: Secondary | ICD-10-CM

## 2023-08-08 DIAGNOSIS — R911 Solitary pulmonary nodule: Secondary | ICD-10-CM | POA: Diagnosis not present

## 2023-08-08 DIAGNOSIS — R1030 Lower abdominal pain, unspecified: Secondary | ICD-10-CM | POA: Diagnosis not present

## 2023-08-08 DIAGNOSIS — R760 Raised antibody titer: Secondary | ICD-10-CM

## 2023-08-08 MED ORDER — HYOSCYAMINE SULFATE 0.125 MG PO TABS: ORAL_TABLET | ORAL | 1 refills | Status: DC

## 2023-08-08 NOTE — Progress Notes (Unsigned)
GI Office Note    Referring Provider: Royann Shivers, * Primary Care Physician:  Sheela Stack  Primary Gastroenterologist: Hennie Duos. Marletta Lor, DO   Chief Complaint   Chief Complaint  Patient presents with   Follow-up    History of Present Illness   Ashlee Austin is a 68 y.o. female presenting today for follow up. Last seen in 06/2023 for diarrhea. Also noted to have AP of 426, Hgb 11.  Labs 05/2023: GI pathogen panel and Cdiff PCR both negative. Na 132, K 4.2, Cre 1.48, glucose 462, Tbili 0.2, AP 426, AST 18, ALT 63 normal. WBC 7.7, Hgb 11L, hct 33.4L, MCV 93, Platelets 360   Labs from December 2024: ANA positive, AMA negative, anti-GP 210 negative, anti-SSB 100 negative, ASMA negative, TIBC 275, iron 44, iron sat 16, ferritin 128, hemoglobin 10.3, hematocrit 33.3, white blood cell count 8400, platelets 392,000, B12 greater than 2000, folate greater than 20, alkaline phosphatase isoenzymes with 79% from bone fraction, magnesium 1.6, GGT 29, TSH 3.246, CRP 0.7, TTG IgA less than 2, lipase 29, creatinine 1.25, alkaline phosphatase 182, total bilirubin 0.4, AST 21, ALT 28  Abdominal x-ray June 27, 2023 with diffuse colonic dilatation suggesting adynamic colonic ileus, normal colon stool burden.  CT chest/abd/pelvis without contrast 11/2022: 1. No acute intrathoracic or intra-abdominal pathology identified.  No definite radiographic explanation for the patient's reported  symptoms.  2. Extensive multi-vessel coronary artery calcification.  3. Mild emphysema.  4. Minimal right nonobstructing nephrolithiasis.  5. Moderate colonic stool burden without evidence of obstruction.  6. Extensive aortoiliac atherosclerotic calcification. Particularly  prominent atherosclerotic calcification is seen at the renal and  mesenteric arterial origins, however, the degree of stenosis is not  well assessed on this noncontrast examination. If there is clinical  evidence  of chronic mesenteric ischemia or hemodynamically  significant renal artery stenosis, CT arteriography may be more  helpful for further evaluation.  7. 5 mm pulmonary nodule within the subpleural right lower lobe is  new from prior examination, axial image # 74/4, and is  indeterminate. Short-term follow-up in 6 months is recommended with  repeat low-dose chest CT without contrast (please use the following  order, "CT CHEST LCS NODULE FOLLOW-UP W/O CM").    Colonoscopy 10/2020: -non-bleeding internal hemorrhoids -diverticulosis -three 4-58mm polyps in transverse , tubular adenoma -next colonoscopy five years.    EGD 10/2020: -non-severe candidiasis esophagitis with no bleeding. Cells for cytology positive for candida, treated -gastritis, reactive gastropathy, no h.pylori   Today: no nocturnal. Mostly am. About 5 tiems per daily. Nasuea am. No brbpr. Mostly water. Creon not helping. Held magnesium no better. Epig and lower abd pain, not pp.  No heartburn.   Medications   Current Outpatient Medications  Medication Sig Dispense Refill   albuterol (VENTOLIN HFA) 108 (90 Base) MCG/ACT inhaler Inhale 2 puffs into the lungs every 4 (four) hours as needed.     ascorbic acid (VITAMIN C) 500 MG tablet Take 500 mg by mouth daily.     aspirin EC 81 MG tablet Take 1 tablet (81 mg total) by mouth daily. Swallow whole. 90 tablet 1   atorvastatin (LIPITOR) 80 MG tablet Take 80 mg by mouth at bedtime.     BD PEN NEEDLE NANO 2ND GEN 32G X 4 MM MISC USE AS DIRECTED EVERY DAY     Cholecalciferol (VITAMIN D3) 10 MCG (400 UNIT) CAPS Take 1 capsule by mouth daily.     cilostazol (PLETAL)  50 MG tablet Take 50 mg by mouth 2 (two) times daily.     Cyanocobalamin (VITAMIN B12 PO) Take 1 tablet by mouth daily.     diphenoxylate-atropine (LOMOTIL) 2.5-0.025 MG tablet Take 1 tablet by mouth every 6 (six) hours as needed.     DULoxetine (CYMBALTA) 60 MG capsule Take 60 mg by mouth daily.     ENTRESTO 24-26 MG  Take 1 tablet by mouth 2 (two) times daily.     feeding supplement (ENSURE ENLIVE / ENSURE PLUS) LIQD Take 237 mLs by mouth 3 (three) times daily between meals. 237 mL 12   FEROSUL 325 (65 Fe) MG tablet Take 325 mg by mouth 2 (two) times daily with a meal.     glipiZIDE (GLUCOTROL XL) 5 MG 24 hr tablet Take 5 mg by mouth daily.     LANTUS SOLOSTAR 100 UNIT/ML Solostar Pen SMARTSIG:10 Unit(s) SUB-Q Daily     lipase/protease/amylase (CREON) 36000 UNITS CPEP capsule Take two capsules with meals and 1 capsule with snacks, up to 8 capsules daily 240 capsule 5   magnesium oxide (MAG-OX) 400 (240 Mg) MG tablet Take 400 mg by mouth 2 (two) times daily.     metoprolol succinate (TOPROL-XL) 25 MG 24 hr tablet Take 25 mg by mouth daily.     Multiple Vitamin (MULTIVITAMIN WITH MINERALS) TABS tablet Take 1 tablet by mouth daily.     oxyCODONE (OXY IR/ROXICODONE) 5 MG immediate release tablet Take 5 mg by mouth every 8 (eight) hours as needed.     pantoprazole (PROTONIX) 40 MG tablet TAKE 1 TABLET(40 MG) BY MOUTH TWICE DAILY 60 tablet 5   Probiotic Product (PROBIOTIC PO) Take 1 tablet by mouth daily.     No current facility-administered medications for this visit.    Allergies   Allergies as of 08/08/2023 - Review Complete 08/08/2023  Allergen Reaction Noted   Codeine Itching 03/31/2023        Review of Systems   General: Negative for anorexia, weight loss, fever, chills, fatigue, +weakness. ENT: Negative for hoarseness, difficulty swallowing , nasal congestion. CV: Negative for chest pain, angina, palpitations, dyspnea on exertion, peripheral edema.  Respiratory: Negative for dyspnea at rest, dyspnea on exertion, cough, sputum, wheezing.  GI: See history of present illness. GU:  Negative for dysuria, hematuria, urinary incontinence, urinary frequency, nocturnal urination.  Endo: Negative for unusual weight change.     Physical Exam   BP (!) 148/86 (BP Location: Left Arm, Patient Position:  Sitting, Cuff Size: Small)   Pulse 91   Temp 98.1 F (36.7 C) (Oral)   Ht 5\' 2"  (1.575 m)   Wt 87 lb (39.5 kg)   SpO2 98%   BMI 15.91 kg/m    General: Well-nourished, well-developed in no acute distress.  Eyes: No icterus. Mouth: Oropharyngeal mucosa moist and pink    Abdomen: Bowel sounds are normal, , nondistended, no hepatosplenomegaly or masses,  no abdominal bruits or hernia , no rebound or guarding. nontender Rectal:not performed Extremities: No lower extremity edema. No clubbing or deformities. Neuro: Alert and oriented x 4   Skin: Warm and dry, no jaundice.   Psych: Alert and cooperative, normal mood and affect.  Labs   *** Imaging Studies   No results found.  Assessment       PLAN  Any labs, anemia? CT chest with pcp Cta ab/pelvis Talk with endo about ap No joing pain  Leanna Battles. Melvyn Neth, MHS, PA-C [facility]

## 2023-08-08 NOTE — Telephone Encounter (Signed)
[employer] PA for CTA: CPT Code: 56213 Description: CT ANGIO ABD&PELV W/O&W/DYE Authorization Number: [id number] Case Number: [id number] Review Date: 08/08/2023 4:06:05 PM Expiration Date: 02/04/2024 Status: Your case has been Approved. The prior authorization you submitted, Case [id number], has been received. Additional case status notifications will be sent if you opted in for email notifications. Thank you.

## 2023-08-08 NOTE — Patient Instructions (Addendum)
CT scan of your abdominal blood vessels/blood flow planned. Please follow up with [facility] for the chest CT, as we could not coordinate with the CT of your abdomen due to contrast issues.  Try hyoscyamine one under tongue at bedtime and in am for diarrhea. Rx sent to pharmacy.  I will send note to the endocrinologist about the bone issues.  Ok to stop creon capsules.

## 2023-09-01 ENCOUNTER — Ambulatory Visit (HOSPITAL_COMMUNITY)
Admission: RE | Admit: 2023-09-01 | Discharge: 2023-09-01 | Disposition: A | Discharge status: Home / Self Care | Payer: Medicare HMO | Source: Ambulatory Visit | Attending: Gastroenterology | Admitting: Gastroenterology

## 2023-09-01 DIAGNOSIS — G8929 Other chronic pain: Secondary | ICD-10-CM | POA: Diagnosis not present

## 2023-09-01 DIAGNOSIS — N289 Disorder of kidney and ureter, unspecified: Secondary | ICD-10-CM | POA: Diagnosis not present

## 2023-09-01 DIAGNOSIS — R1013 Epigastric pain: Secondary | ICD-10-CM | POA: Diagnosis not present

## 2023-09-01 DIAGNOSIS — R1030 Lower abdominal pain, unspecified: Secondary | ICD-10-CM | POA: Diagnosis not present

## 2023-09-01 DIAGNOSIS — R197 Diarrhea, unspecified: Secondary | ICD-10-CM | POA: Diagnosis not present

## 2023-09-01 DIAGNOSIS — I700 Atherosclerosis of aorta: Secondary | ICD-10-CM | POA: Diagnosis not present

## 2023-09-01 DIAGNOSIS — R109 Unspecified abdominal pain: Secondary | ICD-10-CM | POA: Diagnosis not present

## 2023-09-01 LAB — POCT I-STAT CREATININE: Creatinine, Ser: 1.4 mg/dL — ABNORMAL HIGH (ref 0.44–1.00)

## 2023-09-01 MED ORDER — IOHEXOL 350 MG/ML SOLN
100.0000 mL | Freq: Once | INTRAVENOUS | Status: AC | PRN
  Administered 2023-09-01: 100 mL via INTRAVENOUS

## 2023-09-02 ENCOUNTER — Other Ambulatory Visit: Payer: Self-pay | Admitting: Gastroenterology

## 2023-09-06 NOTE — Telephone Encounter (Signed)
 Spoke with pharmacy and they are going to delete the request, it is not cost effective.

## 2023-09-08 NOTE — Telephone Encounter (Signed)
 Spoke with pharmacy and the pt was able to pick up the 30 day supply

## 2023-09-18 DIAGNOSIS — Z1231 Encounter for screening mammogram for malignant neoplasm of breast: Secondary | ICD-10-CM | POA: Diagnosis not present

## 2023-09-20 DIAGNOSIS — R739 Hyperglycemia, unspecified: Secondary | ICD-10-CM | POA: Diagnosis not present

## 2023-09-20 DIAGNOSIS — R627 Adult failure to thrive: Secondary | ICD-10-CM | POA: Diagnosis not present

## 2023-09-20 DIAGNOSIS — I10 Essential (primary) hypertension: Secondary | ICD-10-CM | POA: Diagnosis not present

## 2023-09-20 DIAGNOSIS — M545 Low back pain, unspecified: Secondary | ICD-10-CM | POA: Diagnosis not present

## 2023-09-20 DIAGNOSIS — D649 Anemia, unspecified: Secondary | ICD-10-CM | POA: Diagnosis not present

## 2023-09-20 DIAGNOSIS — I5022 Chronic systolic (congestive) heart failure: Secondary | ICD-10-CM | POA: Diagnosis not present

## 2023-09-20 DIAGNOSIS — R300 Dysuria: Secondary | ICD-10-CM | POA: Diagnosis not present

## 2023-09-20 LAB — HEMOGLOBIN A1C: Hemoglobin A1C: 13.3

## 2023-09-20 LAB — HEPATIC FUNCTION PANEL
ALT: 38 U/L — AB (ref 7–35)
AST: 25 (ref 13–35)
Alkaline Phosphatase: 157 — AB (ref 25–125)

## 2023-09-20 LAB — BASIC METABOLIC PANEL WITH GFR
BUN: 39 — AB (ref 4–21)
Creatinine: 1.4 — AB (ref 0.5–1.1)

## 2023-09-22 LAB — COMPREHENSIVE METABOLIC PANEL WITH GFR: Calcium: 9.3 (ref 8.7–10.7)

## 2023-09-26 ENCOUNTER — Other Ambulatory Visit: Payer: Self-pay | Admitting: *Deleted

## 2023-09-26 DIAGNOSIS — R768 Other specified abnormal immunological findings in serum: Secondary | ICD-10-CM

## 2023-09-26 DIAGNOSIS — R9389 Abnormal findings on diagnostic imaging of other specified body structures: Secondary | ICD-10-CM

## 2023-09-26 DIAGNOSIS — R748 Abnormal levels of other serum enzymes: Secondary | ICD-10-CM

## 2023-09-26 NOTE — Progress Notes (Signed)
 referrals

## 2023-10-04 DIAGNOSIS — Z515 Encounter for palliative care: Secondary | ICD-10-CM | POA: Diagnosis not present

## 2023-10-04 DIAGNOSIS — E46 Unspecified protein-calorie malnutrition: Secondary | ICD-10-CM | POA: Diagnosis not present

## 2023-10-04 DIAGNOSIS — R911 Solitary pulmonary nodule: Secondary | ICD-10-CM | POA: Diagnosis not present

## 2023-10-04 DIAGNOSIS — F339 Major depressive disorder, recurrent, unspecified: Secondary | ICD-10-CM | POA: Diagnosis not present

## 2023-10-04 DIAGNOSIS — R059 Cough, unspecified: Secondary | ICD-10-CM | POA: Diagnosis not present

## 2023-10-04 DIAGNOSIS — J449 Chronic obstructive pulmonary disease, unspecified: Secondary | ICD-10-CM | POA: Diagnosis not present

## 2023-10-04 DIAGNOSIS — G8929 Other chronic pain: Secondary | ICD-10-CM | POA: Diagnosis not present

## 2023-10-04 DIAGNOSIS — I5032 Chronic diastolic (congestive) heart failure: Secondary | ICD-10-CM | POA: Diagnosis not present

## 2023-10-17 ENCOUNTER — Ambulatory Visit: Payer: Medicare HMO | Admitting: "Endocrinology

## 2023-10-17 ENCOUNTER — Encounter: Payer: Self-pay | Admitting: "Endocrinology

## 2023-10-17 VITALS — BP 132/74 | HR 88 | Ht 62.0 in | Wt 91.6 lb

## 2023-10-17 DIAGNOSIS — E1122 Type 2 diabetes mellitus with diabetic chronic kidney disease: Secondary | ICD-10-CM

## 2023-10-17 DIAGNOSIS — I10 Essential (primary) hypertension: Secondary | ICD-10-CM | POA: Diagnosis not present

## 2023-10-17 DIAGNOSIS — R748 Abnormal levels of other serum enzymes: Secondary | ICD-10-CM | POA: Insufficient documentation

## 2023-10-17 DIAGNOSIS — N183 Chronic kidney disease, stage 3 unspecified: Secondary | ICD-10-CM

## 2023-10-17 DIAGNOSIS — E782 Mixed hyperlipidemia: Secondary | ICD-10-CM | POA: Diagnosis not present

## 2023-10-17 DIAGNOSIS — Z7984 Long term (current) use of oral hypoglycemic drugs: Secondary | ICD-10-CM

## 2023-10-17 DIAGNOSIS — Z794 Long term (current) use of insulin: Secondary | ICD-10-CM

## 2023-10-17 NOTE — Progress Notes (Signed)
 Endocrinology Consult Note       10/17/2023, 6:00 PM   Subjective:    Patient ID: Ashlee Austin, female    DOB: 11-19-1955.  Ashlee Austin is being seen in consultation for management of currently uncontrolled symptomatic diabetes requested by  Royann Shivers, PA-C.   Past Medical History:  Diagnosis Date   Carotid artery disease (HCC)    COPD (chronic obstructive pulmonary disease) (HCC)    Coronary atherosclerosis of native coronary artery    [facility]; negative Dobutamine echo 6/11   Depression    Essential hypertension    Hyperlipidemia    Hypothyroidism    Stroke (HCC) 2020   Stroke [facility]) 2014   Type 2 diabetes mellitus (HCC)     Past Surgical History:  Procedure Laterality Date   BIOPSY  10/20/2020   Procedure: BIOPSY;  Surgeon: Lanelle Bal, DO;  Location: [facility];  Service: Endoscopy;;   CARPAL TUNNEL RELEASE     COLONOSCOPY WITH PROPOFOL N/A 10/20/2020   Procedure: COLONOSCOPY WITH PROPOFOL;  Surgeon: Lanelle Bal, DO;  Location: [facility];  Service: Endoscopy;  Laterality: N/A;  pm (early as possible), diabetic   CYSTECTOMY     Spine   ESOPHAGOGASTRODUODENOSCOPY (EGD) WITH PROPOFOL N/A 10/20/2020   Procedure: ESOPHAGOGASTRODUODENOSCOPY (EGD) WITH PROPOFOL;  Surgeon: Lanelle Bal, DO;  Location: [facility];  Service: Endoscopy;  Laterality: N/A;   TOTAL ABDOMINAL HYSTERECTOMY  2004   TYMPANOPLASTY      Social History   Socioeconomic History   Marital status: Widowed    Spouse name: Not on file   Number of children: Not on file   Years of education: Not on file   Highest education level: Not on file  Occupational History    Employer: [employer]    Comment: Full time at [employer]  Tobacco Use   Smoking status: Every Day    Current packs/day: 0.00    Average packs/day: 1 pack/day for 47.7 years (47.7 ttl pk-yrs)    Types: Cigarettes     Start date: 01/31/1972    Last attempt to quit: 10/17/2019    Years since quitting: 4.0   Smokeless tobacco: Never  Vaping Use   Vaping status: Former  Substance and Sexual Activity   Alcohol use: No   Drug use: No   Sexual activity: Not on file  Other Topics Concern   Not on file  Social History Narrative   Not on file   Social Drivers of Health   Financial Resource Strain: Low Risk  (01/12/2023)   Received from [facility]   Overall Financial Resource Strain (CARDIA)    Difficulty of Paying Living Expenses: Not hard at all  Food Insecurity: No Food Insecurity (01/12/2023)   Received from [facility]   Hunger Vital Sign    Worried About Running Out of Food in the Last Year: Never true    Ran Out of Food in the Last Year: Never true  Transportation Needs: No Transportation Needs (01/12/2023)   Received from [facility]   [facility] - Transportation  Lack of Transportation (Medical): No    Lack of Transportation (Non-Medical): No  Physical Activity: Inactive (09/19/2022)   [facility]   Exercise Vital Sign    Days of Exercise per Week: 0 days    Minutes of Exercise per Session: 0 min  Stress: No Stress Concern Present (09/19/2022)   Received from [facility], [facility] - Occupational Stress Questionnaire    Feeling of Stress : Not at all  Social Connections: Moderately Isolated (09/19/2022)   Received from [facility]   Social Connection and Isolation Panel [NHANES]    Frequency of Communication with Friends and Family: Three times a week    Frequency of Social Gatherings with Friends and Family: Never    Attends Religious Services: More than 4 times per year    Active Member of Clubs or Organizations: No    Attends Banker Meetings: Never    Marital Status: Widowed    Family History  Problem Relation Age of Onset   Diabetes Mother    Coronary  artery disease Mother        MI age 75   Heart attack Mother    Lung cancer Father    Diabetes Sister     Outpatient Encounter Medications as of 10/17/2023  Medication Sig   albuterol (VENTOLIN HFA) 108 (90 Base) MCG/ACT inhaler Inhale 2 puffs into the lungs every 4 (four) hours as needed.   ascorbic acid (VITAMIN C) 500 MG tablet Take 500 mg by mouth daily.   aspirin EC 81 MG tablet Take 1 tablet (81 mg total) by mouth daily. Swallow whole.   atorvastatin (LIPITOR) 80 MG tablet Take 80 mg by mouth at bedtime.   BD PEN NEEDLE NANO 2ND GEN 32G X 4 MM MISC USE AS DIRECTED EVERY DAY   Cholecalciferol (VITAMIN D3) 10 MCG (400 UNIT) CAPS Take 1 capsule by mouth daily.   cilostazol (PLETAL) 50 MG tablet Take 50 mg by mouth 2 (two) times daily.   Cyanocobalamin (VITAMIN B12 PO) Take 1 tablet by mouth daily.   DULoxetine (CYMBALTA) 60 MG capsule Take 60 mg by mouth daily.   ENTRESTO 24-26 MG Take 1 tablet by mouth 2 (two) times daily.   feeding supplement (ENSURE ENLIVE / ENSURE PLUS) LIQD Take 237 mLs by mouth 3 (three) times daily between meals.   FEROSUL 325 (65 Fe) MG tablet Take 325 mg by mouth 2 (two) times daily with a meal.   glipiZIDE (GLUCOTROL XL) 5 MG 24 hr tablet Take 5 mg by mouth daily.   hyoscyamine (LEVSIN) 0.125 MG tablet Take one dose under tongue at bedtime and in am for diarrhea (Patient not taking: Reported on 10/17/2023)   LANTUS SOLOSTAR 100 UNIT/ML Solostar Pen Inject 30 Units into the skin at bedtime.   lipase/protease/amylase (CREON) 36000 UNITS CPEP capsule Take two capsules with meals and 1 capsule with snacks, up to 8 capsules daily   magnesium oxide (MAG-OX) 400 (240 Mg) MG tablet Take 400 mg by mouth 2 (two) times daily.   metoprolol succinate (TOPROL-XL) 25 MG 24 hr tablet Take 25 mg by mouth daily.   Multiple Vitamin (MULTIVITAMIN WITH MINERALS) TABS tablet Take 1 tablet by mouth daily.   oxyCODONE (OXY IR/ROXICODONE) 5 MG immediate release tablet Take 5 mg by mouth  every 8 (eight) hours as needed.   pantoprazole (PROTONIX) 40 MG tablet TAKE 1  TABLET(40 MG) BY MOUTH TWICE DAILY   Probiotic Product (PROBIOTIC PO) Take 1 tablet by mouth daily.   No facility-administered encounter medications on file as of 10/17/2023.    ALLERGIES: Allergies  Allergen Reactions   Codeine Itching    VACCINATION STATUS:  There is no immunization history on file for this patient.  Diabetes She presents for her initial diabetic visit. She has type 2 diabetes mellitus. Her disease course has been worsening. There are no hypoglycemic associated symptoms. Pertinent negatives for hypoglycemia include no confusion, headaches, pallor or seizures. Associated symptoms include fatigue, polydipsia and polyuria. Pertinent negatives for diabetes include no chest pain and no polyphagia. There are no hypoglycemic complications. Symptoms are worsening. Diabetic complications include a CVA, heart disease and nephropathy. Risk factors for coronary artery disease include dyslipidemia, hypertension, tobacco exposure, post-menopausal and sedentary lifestyle. Current diabetic treatment includes insulin injections. Her weight is fluctuating minimally. She is following a generally unhealthy diet. When asked about meal planning, she reported none. She has not had a previous visit with a dietitian. She never participates in exercise. Her overall blood glucose range is >200 mg/dl. (She presents with his appointment which has a few blood glucose readings, significantly above target averaging 250 mg/day.  Her recent labs show A1c of 13.3% on September 22, 2023.) An ACE inhibitor/angiotensin II receptor blocker is being taken.   -Elevated alkaline phosphatase: This is patient's other concern.  She was observed to have elevated alkaline phosphatase of unclear etiology.  She has multiple medical problems including type 2 diabetes, exocrine pancreatic insufficiency, hypertension, coronary artery disease, CVA, pain  syndrome. Her lab work so far showed stage 3 CKD.  She did have normal GGT. denies any prior history of bone disorders.  She was found to have hypervitaminosis B12 until recently when she was advised to stop. She has history of chronic diarrhea on ongoing workup and GI follow-up.  She does have positive ANA antibodies, iron deficiency anemia.    Review of Systems  Constitutional:  Positive for fatigue. Negative for chills, fever and unexpected weight change.  HENT:  Negative for trouble swallowing and voice change.   Eyes:  Negative for visual disturbance.  Respiratory:  Negative for cough, shortness of breath and wheezing.   Cardiovascular:  Negative for chest pain, palpitations and leg swelling.  Gastrointestinal:  Negative for diarrhea, nausea and vomiting.  Endocrine: Positive for polydipsia and polyuria. Negative for cold intolerance, heat intolerance and polyphagia.  Musculoskeletal:  Negative for arthralgias and myalgias.  Skin:  Negative for color change, pallor, rash and wound.  Neurological:  Negative for seizures and headaches.  Psychiatric/Behavioral:  Negative for confusion and suicidal ideas.     Objective:       10/17/2023    2:00 PM 08/08/2023    3:06 PM 08/08/2023    3:02 PM  Vitals with BMI  Height 5\' 2"   5\' 2"   Weight 91 lbs 10 oz  87 lbs  BMI 16.75  15.91  Systolic 132 148 161  Diastolic 74 86 78  Pulse 88 91 91    BP 132/74   Pulse 88   Ht 5\' 2"  (1.575 m)   Wt 91 lb 9.6 oz (41.5 kg)   BMI 16.75 kg/m   Wt Readings from Last 3 Encounters:  10/17/23 91 lb 9.6 oz (41.5 kg)  08/08/23 87 lb (39.5 kg)  08/02/23 87 lb 6.4 oz (39.6 kg)     Physical Exam Constitutional:      Appearance: She  is well-developed.  HENT:     Head: Normocephalic and atraumatic.  Neck:     Thyroid: No thyromegaly.     Trachea: No tracheal deviation.  Cardiovascular:     Rate and Rhythm: Normal rate and regular rhythm.  Pulmonary:     Effort: Pulmonary effort is normal.      Breath sounds: Normal breath sounds.  Abdominal:     General: Bowel sounds are normal.     Palpations: Abdomen is soft.     Tenderness: There is no abdominal tenderness. There is no guarding.  Musculoskeletal:        General: Normal range of motion.     Cervical back: Normal range of motion and neck supple.     Comments: No gross bony abnormalities, tenderness or deformities.  Skin:    General: Skin is warm and dry.     Coloration: Skin is not pale.     Findings: No erythema or rash.  Neurological:     Mental Status: She is alert and oriented to person, place, and time.     Cranial Nerves: No cranial nerve deficit.     Coordination: Coordination normal.     Deep Tendon Reflexes: Reflexes are normal and symmetric.  Psychiatric:        Judgment: Judgment normal.       CMP ( most recent) CMP     Component Value Date/Time   NA 134 (L) 06/27/2023 1501   K 3.7 06/27/2023 1501   CL 102 06/27/2023 1501   CO2 19 (L) 06/27/2023 1501   GLUCOSE 302 (H) 06/27/2023 1501   BUN 39 (A) 09/20/2023 0000   CREATININE 1.4 (A) 09/20/2023 0000   CREATININE 1.40 (H) 09/01/2023 1452   CALCIUM 9.3 09/20/2023 0000   PROT 6.8 06/27/2023 1501   ALBUMIN 3.5 06/27/2023 1501   AST 25 09/20/2023 0000   ALT 38 (A) 09/20/2023 0000   ALKPHOS 157 (A) 09/20/2023 0000   BILITOT 0.4 06/27/2023 1501   GFRNONAA 47 (L) 06/27/2023 1501    Diabetic Labs (most recent): Lab Results  Component Value Date   HGBA1C 13.3 09/20/2023   HGBA1C 6.0 (H) 03/10/2022     Lipid Panel ( most recent) Lipid Panel     Component Value Date/Time   CHOL 99 03/11/2022 0047   TRIG 129 03/11/2022 0047   HDL 28 (L) 03/11/2022 0047   CHOLHDL 3.5 03/11/2022 0047   VLDL 26 03/11/2022 0047   LDLCALC 45 03/11/2022 0047      Lab Results  Component Value Date   TSH 3.246 06/27/2023   TSH 1.825 03/10/2022   FREET4 1.03 06/27/2023       Assessment & Plan:   1. Elevated alkaline phosphatase level (Primary) -Her most  recent labs show a trend of improvement in her alkaline phosphatase to 157 from 212 in November 2024. Etiology unclear.  She would benefit from workup to rule out Paget's disease.  She will be considered for whole-body bone scan before her next visit.   2. Diabetes mellitus with stage 3 chronic kidney disease (HCC)   - Ashlee Austin has currently uncontrolled symptomatic type 2 DM  with most recent A1c of 13.3%. Recent labs reviewed. - I had a long discussion with her about the possible risk factors and  the pathology behind diabetes and its complications. -her diabetes is complicated by coronary artery disease, CVA, CKD, PAD and she remains at exceedingly high risk for more acute and chronic complications which include CAD,  CVA, CKD, retinopathy, and neuropathy. These are all discussed in detail with her.  - I discussed all available options of managing her diabetes including de-escalation of medications. I have counseled her on Food as Medicine by adopting a Whole Food , Plant Predominant  ( WFPP) nutrition as recommended by [employer] of Lifestyle Medicine. Patient is encouraged to switch to  unprocessed or minimally processed  complex starch, adequate protein intake (mainly plant source), minimal liquid fat, plenty of fruits, and vegetables. -  she is advised to stick to a routine mealtimes to eat 3 complete meals a day and snack only when necessary (to snack only to correct hypoglycemia BG <70 day time or <100 at night).   - she acknowledges that there is a room for improvement in her food and drink choices. - Further Specific Suggestion is made for her to avoid simple carbohydrates  from her diet including Cakes, Sweet Desserts, Ice Cream, Soda (diet and regular), Sweet Tea, Candies, Chips, Cookies, Store Bought Juices, Alcohol ,  Artificial Sweeteners,  Coffee Creamer, and "Sugar-free" Products. This will help patient to have more stable blood glucose profile and potentially avoid  unintended weight gain.  - I have approached her with the following individualized plan to manage  her diabetes and patient agrees:   -Before considering multiple daily injections of insulin, she has a room to maximize her basal insulin.  Accordingly, I advised her to increase Lantus to 30 units nightly associated with monitoring of blood glucose 4 times a day-daily before breakfast, lunch, supper, and at bedtime and return in 2 weeks with her logs and meter. She is encouraged to call clinic for hypoglycemia below 70 or hyperglycemia above 300 mg per DL. -For the time being, she is advised to continue glipizide 5 mg XL p.o. daily at breakfast. -She is not a suitable candidate for GLP-1 receptors nor SGLT2 inhibitors due to low BMI.  - Specific targets for  A1c;  LDL, HDL,  and Triglycerides were discussed with the patient.  3) Blood Pressure /Hypertension:  her blood pressure is  controlled to target.   she is advised to continue her current medications including Entresto 24-26 mg p.o. daily with breakfast along with her metoprolol 25 mg p.o. daily at breakfast.  4) Lipids/Hyperlipidemia:   Review of her recent lipid panel showed  controlled  LDL at 45 .  she  is exceedingly high risk for cardiovascular disease recurrence, advised to continue atorvastatin 80 mg p.o. daily at bedtime.    Side effects and precautions discussed with her.  5)  Weight/Diet:  Body mass index is 16.75 kg/m.  -   she is not a candidate for weight loss.   6) Chronic Care/Health Maintenance:  -she  is on ACEI/ARB and Statin medications and  is encouraged to initiate and continue to follow up with Ophthalmology, Dentist,  Podiatrist at least yearly or according to recommendations, and advised to  quit smoking. I have recommended yearly flu vaccine and pneumonia vaccine at least every 5 years; moderate intensity exercise for up to 150 minutes weekly; and  sleep for 7- 9 hours a day.  - she is  advised to maintain close  follow up with Royann Shivers, PA-C for primary care needs, as well as her other providers for optimal and coordinated care.   Thank you for involving me in the care of this pleasant patient.  I spent  61  minutes in the care of the patient today including review of  labs from CMP, Lipids, Thyroid Function, Hematology (current and previous including abstractions from other facilities); face-to-face time discussing  her blood glucose readings/logs, discussing hypoglycemia and hyperglycemia episodes and symptoms, medications doses, her options of short and long term treatment based on the latest standards of care / guidelines;  discussion about incorporating lifestyle medicine;  and documenting the encounter. Risk reduction counseling performed per USPSTF guidelines to reduce  cardiovascular risk factors.      Please refer to Patient Instructions for Blood Glucose Monitoring and Insulin/Medications Dosing Guide"  in media tab for additional information. Please  also refer to " Patient Self Inventory" in the Media  tab for reviewed elements of pertinent patient history.  Jose Persia participated in the discussions, expressed understanding, and voiced agreement with the above plans.  All questions were answered to her satisfaction. she is encouraged to contact clinic should she have any questions or concerns prior to her return visit.   Follow up plan: - Return in about 2 weeks (around 10/31/2023), or And bone scan before next visit, for F/U with Meter/CGM /Logs Only - no Labs.  Marquis Lunch, MD [facility] [address] [place], [place] [address] Phone: [phone]  Fax: [phone]    10/17/2023, 6:00 PM  This note was partially dictated with voice recognition software. Similar sounding words can be transcribed inadequately or may not  be corrected upon review.

## 2023-10-17 NOTE — Patient Instructions (Signed)

## 2023-10-24 ENCOUNTER — Telehealth: Payer: Self-pay

## 2023-10-24 NOTE — Telephone Encounter (Signed)
 Pt called with high BG readings.   Date Before breakfast Before lunch Before supper Bedtime  10/21/23 [phone]  10/22/23 [phone]  10/23/23 [phone]  10/24/23 108       Pt taking: glipizide 5mg  daily, Lantus 30 units at bedtime. Please advise.

## 2023-10-24 NOTE — Telephone Encounter (Signed)
 Spoke with pt advising her to increase her Glipizide to 10mg  daily with breakfast, continue Lantus 30 units at bedtime and to contact the office if her BG readings are above 300 x 3 per Dr.Nida's orders. Pt voiced understanding.

## 2023-10-26 ENCOUNTER — Encounter (HOSPITAL_COMMUNITY)
Admission: RE | Admit: 2023-10-26 | Discharge: 2023-10-26 | Disposition: A | Discharge status: Home / Self Care | Source: Ambulatory Visit | Attending: "Endocrinology | Admitting: "Endocrinology

## 2023-10-26 DIAGNOSIS — R748 Abnormal levels of other serum enzymes: Secondary | ICD-10-CM | POA: Diagnosis not present

## 2023-10-26 DIAGNOSIS — M899 Disorder of bone, unspecified: Secondary | ICD-10-CM | POA: Insufficient documentation

## 2023-10-26 MED ORDER — TECHNETIUM TC 99M MEDRONATE IV KIT
20.0000 | PACK | Freq: Once | INTRAVENOUS | Status: AC | PRN
  Administered 2023-10-26: 20.1 via INTRAVENOUS

## 2023-11-02 ENCOUNTER — Ambulatory Visit: Admitting: "Endocrinology

## 2023-11-21 ENCOUNTER — Ambulatory Visit: Admitting: Vascular Surgery

## 2023-11-21 ENCOUNTER — Encounter

## 2023-11-21 ENCOUNTER — Ambulatory Visit: Payer: Medicare HMO | Admitting: Vascular Surgery

## 2023-11-27 ENCOUNTER — Encounter: Payer: Self-pay | Admitting: "Endocrinology

## 2023-11-27 ENCOUNTER — Ambulatory Visit: Admitting: "Endocrinology

## 2023-11-27 VITALS — BP 128/70 | HR 88 | Ht 62.0 in | Wt 94.6 lb

## 2023-11-27 DIAGNOSIS — N183 Chronic kidney disease, stage 3 unspecified: Secondary | ICD-10-CM

## 2023-11-27 DIAGNOSIS — E1122 Type 2 diabetes mellitus with diabetic chronic kidney disease: Secondary | ICD-10-CM | POA: Diagnosis not present

## 2023-11-27 DIAGNOSIS — R748 Abnormal levels of other serum enzymes: Secondary | ICD-10-CM | POA: Diagnosis not present

## 2023-11-27 DIAGNOSIS — Z794 Long term (current) use of insulin: Secondary | ICD-10-CM | POA: Diagnosis not present

## 2023-11-27 DIAGNOSIS — E782 Mixed hyperlipidemia: Secondary | ICD-10-CM | POA: Diagnosis not present

## 2023-11-27 DIAGNOSIS — I10 Essential (primary) hypertension: Secondary | ICD-10-CM | POA: Diagnosis not present

## 2023-11-27 MED ORDER — INSULIN LISPRO PROT & LISPRO (75-25 MIX) 100 UNIT/ML KWIKPEN: PEN_INJECTOR | SUBCUTANEOUS | 1 refills | Status: DC

## 2023-11-27 NOTE — Patient Instructions (Signed)

## 2023-11-27 NOTE — Progress Notes (Signed)
 11/27/2023, 4:34 PM   Endocrinology follow-up note  Subjective:    Patient ID: Ashlee Austin, female    DOB: 10/17/1955.  Ashlee Austin is being seen in consultation for management of currently uncontrolled symptomatic diabetes requested by  Skillman, Katherine E, PA-C.   Past Medical History:  Diagnosis Date   Carotid artery disease (HCC)    COPD (chronic obstructive pulmonary disease) (HCC)    Coronary atherosclerosis of native coronary artery    [facility]; negative Dobutamine echo 6/11   Depression    Essential hypertension    Hyperlipidemia    Hypothyroidism    Stroke (HCC) 2020   Stroke [facility]) 2014   Type 2 diabetes mellitus (HCC)     Past Surgical History:  Procedure Laterality Date   BIOPSY  10/20/2020   Procedure: BIOPSY;  Surgeon: Vinetta Greening, DO;  [facility];  Service: Endoscopy;;   CARPAL TUNNEL RELEASE     COLONOSCOPY WITH PROPOFOL  N/A 10/20/2020   Procedure: COLONOSCOPY WITH PROPOFOL ;  Surgeon: Vinetta Greening, DO;  Location: [facility];  Service: Endoscopy;  Laterality: N/A;  pm (early as possible), diabetic   CYSTECTOMY     Spine   ESOPHAGOGASTRODUODENOSCOPY (EGD) WITH PROPOFOL  N/A 10/20/2020   Procedure: ESOPHAGOGASTRODUODENOSCOPY (EGD) WITH PROPOFOL ;  Surgeon: Vinetta Greening, DO;  Location: [facility];  Service: Endoscopy;  Laterality: N/A;   TOTAL ABDOMINAL HYSTERECTOMY  2004   TYMPANOPLASTY      Social History   Socioeconomic History   Marital status: Widowed    Spouse name: Not on file   Number of children: Not on file   Years of education: Not on file   Highest education level: Not on file  Occupational History    Employer: [employer]    Comment: Full time at [employer]  Tobacco Use   Smoking status: Every Day    Current packs/day: 0.00    Average packs/day: 1 pack/day for 47.7 years (47.7 ttl pk-yrs)    Types: Cigarettes     Start date: 01/31/1972    Last attempt to quit: 10/17/2019    Years since quitting: 4.1   Smokeless tobacco: Never  Vaping Use   Vaping status: Former  Substance and Sexual Activity   Alcohol  use: No   Drug use: No   Sexual activity: Not on file  Other Topics Concern   Not on file  Social History Narrative   Not on file   Social Drivers of Health   Financial Resource Strain: Low Risk  (01/12/2023)   [facility]   Overall Financial Resource Strain (CARDIA)    Difficulty of Paying Living Expenses: Not hard at all  Food Insecurity: No Food Insecurity (01/12/2023)   Received from [facility]   Hunger Vital Sign    Worried About Running Out of Food in the Last Year: Never true    Ran Out of Food in the Last Year: Never true  Transportation Needs: No Transportation Needs (01/12/2023)   Received from [facility] - Transportation    Lack of Transportation (  Medical): No    Lack of Transportation (Non-Medical): No  Physical Activity: Inactive (09/19/2022)   Received from [facility], [facility]   Exercise Vital Sign    Days of Exercise per Week: 0 days    Minutes of Exercise per Session: 0 min  Stress: No Stress Concern Present (09/19/2022)   Received from [facility], [facility] of [facility]ational [facility] - Occupational Stress Questionnaire    Feeling of Stress : Not at all  Social Connections: Moderately Isolated (09/19/2022)   Received from [facility], [facility]   Social Connection and Isolation Panel [NHANES]    Frequency of Communication with Friends and Family: Three times a week    Frequency of Social Gatherings with Friends and Family: Never    Attends Religious Services: More than 4 times per year    Active Member of Clubs or Organizations: No    Attends Banker Meetings: Never    Marital Status: Widowed    Family History  Problem Relation Age of Onset   Diabetes Mother    Coronary  artery disease Mother        MI age 70   Heart attack Mother    Lung cancer Father    Diabetes Sister     Outpatient Encounter Medications as of 11/27/2023  Medication Sig   Insulin  Lispro Prot & Lispro (HUMALOG MIX 75/25 KWIKPEN) (75-25) 100 UNIT/ML Kwikpen Inject 20 units with breakfast and 20 units with supper when glucose is above 90 and only if eating   albuterol (VENTOLIN HFA) 108 (90 Base) MCG/ACT inhaler Inhale 2 puffs into the lungs every 4 (four) hours as needed.   ascorbic acid (VITAMIN C) 500 MG tablet Take 500 mg by mouth daily.   aspirin  EC 81 MG tablet Take 1 tablet (81 mg total) by mouth daily. Swallow whole.   atorvastatin (LIPITOR) 80 MG tablet Take 80 mg by mouth at bedtime.   BD PEN NEEDLE NANO 2ND GEN 32G X 4 MM MISC USE AS DIRECTED EVERY DAY   Cholecalciferol (VITAMIN D3) 10 MCG (400 UNIT) CAPS Take 1 capsule by mouth daily.   cilostazol  (PLETAL ) 50 MG tablet Take 50 mg by mouth 2 (two) times daily.   Cyanocobalamin  (VITAMIN B12 PO) Take 1 tablet by mouth daily.   DULoxetine (CYMBALTA) 60 MG capsule Take 60 mg by mouth daily.   ENTRESTO 24-26 MG Take 1 tablet by mouth 2 (two) times daily.   feeding supplement (ENSURE ENLIVE / ENSURE PLUS) LIQD Take 237 mLs by mouth 3 (three) times daily between meals.   FEROSUL 325 (65 Fe) MG tablet Take 325 mg by mouth 2 (two) times daily with a meal.   glipiZIDE (GLUCOTROL XL) 5 MG 24 hr tablet Take 5 mg by mouth daily.   hyoscyamine  (LEVSIN) 0.125 MG tablet Take one dose under tongue at bedtime and in am for diarrhea (Patient not taking: Reported on 10/17/2023)   lipase/protease/amylase (CREON ) 36000 UNITS CPEP capsule Take two capsules with meals and 1 capsule with snacks, up to 8 capsules daily   magnesium  oxide (MAG-OX) 400 (240 Mg) MG tablet Take 400 mg by mouth 2 (two) times daily.   metoprolol succinate (TOPROL-XL) 25 MG 24 hr tablet Take 25 mg by mouth daily.   Multiple Vitamin (MULTIVITAMIN WITH MINERALS) TABS tablet Take 1  tablet by mouth daily.   oxyCODONE (OXY IR/ROXICODONE) 5 MG immediate release tablet Take 5 mg by mouth  every 8 (eight) hours as needed.   pantoprazole  (PROTONIX ) 40 MG tablet TAKE 1 TABLET(40 MG) BY MOUTH TWICE DAILY   Probiotic Product (PROBIOTIC PO) Take 1 tablet by mouth daily.   [DISCONTINUED] LANTUS  SOLOSTAR 100 UNIT/ML Solostar Pen Inject 25 Units into the skin at bedtime.   No facility-administered encounter medications on file as of 11/27/2023.    ALLERGIES: Allergies  Allergen Reactions   Codeine Itching    VACCINATION STATUS:  There is no immunization history on file for this patient.  Diabetes She presents for her follow-up diabetic visit. She has type 2 diabetes mellitus. Her disease course has been worsening. There are no hypoglycemic associated symptoms. Pertinent negatives for hypoglycemia include no confusion, headaches, pallor or seizures. Associated symptoms include fatigue, polydipsia and polyuria. Pertinent negatives for diabetes include no chest pain and no polyphagia. There are no hypoglycemic complications. Symptoms are worsening. Diabetic complications include a CVA, heart disease and nephropathy. Risk factors for coronary artery disease include dyslipidemia, hypertension, tobacco exposure, post-menopausal and sedentary lifestyle. Current diabetic treatment includes insulin  injections. Her weight is fluctuating minimally. She is following a generally unhealthy diet. When asked about meal planning, she reported none. She has not had a previous visit with a dietitian. She never participates in exercise. Her home blood glucose trend is increasing steadily. Her breakfast blood glucose range is generally 130-140 mg/dl. Her lunch blood glucose range is generally >200 mg/dl. Her dinner blood glucose range is generally >200 mg/dl. Her bedtime blood glucose range is generally >200 mg/dl. Her overall blood glucose range is >200 mg/dl. (She presents with her meter and logs showing  tightly controlled fasting glycemic profile, however significantly above target postprandial readings.  Her recent A1c was 13.3%.  ) An ACE inhibitor/angiotensin II receptor blocker is being taken.   -Elevated alkaline phosphatase: This is patient's other concern.  She was observed to have elevated alkaline phosphatase of unclear etiology.  She has multiple medical problems including type 2 diabetes, exocrine pancreatic insufficiency, hypertension, coronary artery disease, CVA, pain syndrome. Her lab work so far showed stage 3 CKD.  She did have normal GGT. denies any prior history of bone disorders.  She was found to have hypervitaminosis B12 until recently when she was advised to stop. She has history of chronic diarrhea on ongoing workup and GI follow-up.  She does have positive ANA antibodies, iron deficiency anemia. Her previsit bone scan did not reveal evidence of Paget's disease.     Objective:       11/27/2023    2:25 PM 10/17/2023    2:00 PM 08/08/2023    3:06 PM  Vitals with BMI  Height 5\' 2"  5\' 2"    Weight 94 lbs 10 oz 91 lbs 10 oz   BMI 17.3 16.75   Systolic 128 132 161  Diastolic 70 74 86  Pulse 88 88 91    BP 128/70   Pulse 88   Ht 5\' 2"  (1.575 m)   Wt 94 lb 9.6 oz (42.9 kg)   BMI 17.30 kg/m   Wt Readings from Last 3 Encounters:  11/27/23 94 lb 9.6 oz (42.9 kg)  10/17/23 91 lb 9.6 oz (41.5 kg)  08/08/23 87 lb (39.5 kg)      CMP ( most recent) CMP     Component Value Date/Time   NA 134 (L) 06/27/2023 1501   K 3.7 06/27/2023 1501   CL 102 06/27/2023 1501   CO2 19 (L) 06/27/2023 1501   GLUCOSE 302 (H) 06/27/2023  1501   BUN 39 (A) 09/20/2023 0000   CREATININE 1.4 (A) 09/20/2023 0000   CREATININE 1.40 (H) 09/01/2023 1452   CALCIUM 9.3 09/20/2023 0000   PROT 6.8 06/27/2023 1501   ALBUMIN 3.5 06/27/2023 1501   AST 25 09/20/2023 0000   ALT 38 (A) 09/20/2023 0000   ALKPHOS 157 (A) 09/20/2023 0000   BILITOT 0.4 06/27/2023 1501   GFRNONAA 47 (L) 06/27/2023  1501    Diabetic Labs (most recent): Lab Results  Component Value Date   HGBA1C 13.3 09/20/2023   HGBA1C 6.0 (H) 03/10/2022     Lipid Panel ( most recent) Lipid Panel     Component Value Date/Time   CHOL 99 03/11/2022 0047   TRIG 129 03/11/2022 0047   HDL 28 (L) 03/11/2022 0047   CHOLHDL 3.5 03/11/2022 0047   VLDL 26 03/11/2022 0047   LDLCALC 45 03/11/2022 0047      Lab Results  Component Value Date   TSH 3.246 06/27/2023   TSH 1.825 03/10/2022   FREET4 1.03 06/27/2023       Assessment & Plan:   1. Elevated alkaline phosphatase level (Primary) -Her most recent labs show a trend of improvement in her alkaline phosphatase to 157 from 212 in November 2024. Etiology unclear.  Her previsit bone scan did not reveal evidence of Paget's disease.  She will be put on expectant management with plan to repeat CMP before her next visit.     2. Diabetes mellitus with stage 3 chronic kidney disease (HCC)   - Ashlee Austin has currently uncontrolled symptomatic type 2 DM  with most recent A1c of 13.3%. Recent labs reviewed. She was put on readjusted Lantus  and glipizide 5 mg p.o. daily with breakfast. She presents with her meter and logs showing tightly controlled fasting glycemic profile, however significantly above target postprandial readings.     - I had a long discussion with her about the possible risk factors and  the pathology behind diabetes and its complications. -her diabetes is complicated by coronary artery disease, CVA, CKD, PAD and she remains at exceedingly high risk for more acute and chronic complications which include CAD, CVA, CKD, retinopathy, and neuropathy. These are all discussed in detail with her.  - I discussed all available options of managing her diabetes including de-escalation of medications. I have counseled her on Food as Medicine by adopting a Whole Food , Plant Predominant  ( WFPP) nutrition as recommended by [employer] of Lifestyle  Medicine. Patient is encouraged to switch to  unprocessed or minimally processed  complex starch, adequate protein intake (mainly plant source), minimal liquid fat, plenty of fruits, and vegetables. -  she is advised to stick to a routine mealtimes to eat 3 complete meals a day and snack only when necessary (to snack only to correct hypoglycemia BG <70 day time or <100 at night).   - she acknowledges that there is a room for improvement in her food and drink choices. - Further Specific Suggestion is made for her to avoid simple carbohydrates  from her diet including Cakes, Sweet Desserts, Ice Cream, Soda (diet and regular), Sweet Tea, Candies, Chips, Cookies, Store Bought Juices, Alcohol  ,  Artificial Sweeteners,  Coffee Creamer, and "Sugar-free" Products. This will help patient to have more stable blood glucose profile and potentially avoid unintended weight gain.  - I have approached her with the following individualized plan to manage  her diabetes and patient agrees:   - In light of her presentation with  significantly above target postprandial glycemic profile, she will need multiple daily injections of insulin  instead of basal Lantus .    I discussed and prescribed Humalog 75/25 20 units with breakfast and 20 units with supper when her Premeal blood glucose readings are above 90 mg per DL and only when she is eating.    She is advised to discontinue Lantus .  She will continue monitoring blood glucose 4 times a day-daily before meals and at bedtime.  She is encouraged to call clinic for hypoglycemia below 70 or hyperglycemia above 300 mg per DL. -For the time being, she is advised to continue glipizide 5 mg XL p.o. daily at breakfast. -She is not a suitable candidate for GLP-1 receptors nor SGLT2 inhibitors due to low BMI.  - Specific targets for  A1c;  LDL, HDL,  and Triglycerides were discussed with the patient.  3) Blood Pressure /Hypertension:    Her blood pressure is controlled to target.   she is advised to continue her current medications including Entresto 24-26 mg p.o. daily with breakfast along with her metoprolol 25 mg p.o. daily at breakfast.  4) Lipids/Hyperlipidemia:   Review of her recent lipid panel showed  controlled  LDL at 45 .  she  is exceedingly high risk for cardiovascular disease recurrence, advised to continue atorvastatin 80 mg po daily at bedtime.       Side effects and precautions discussed with her.  5)  Weight/Diet:  Body mass index is 17.3 kg/m.  -   she is not a candidate for weight loss.   6) Chronic Care/Health Maintenance:  -she  is on ACEI/ARB and Statin medications and  is encouraged to initiate and continue to follow up with Ophthalmology, Dentist,  Podiatrist at least yearly or according to recommendations, and advised to  quit smoking. I have recommended yearly flu vaccine and pneumonia vaccine at least every 5 years; moderate intensity exercise for up to 150 minutes weekly; and  sleep for 7- 9 hours a day.  - she is  advised to maintain close follow up with Skillman, Katherine E, PA-C for primary care needs, as well as her other providers for optimal and coordinated care.   I spent  42  minutes in the care of the patient today including review of labs from CMP, Lipids, Thyroid  Function, Hematology (current and previous including abstractions from other facilities); face-to-face time discussing  her blood glucose readings/logs, discussing hypoglycemia and hyperglycemia episodes and symptoms, medications doses, her options of short and long term treatment based on the latest standards of care / guidelines;  discussion about incorporating lifestyle medicine;  and documenting the encounter. Risk reduction counseling performed per USPSTF guidelines to reduce  cardiovascular risk factors.     Please refer to Patient Instructions for Blood Glucose Monitoring and Insulin /Medications Dosing Guide"  in media tab for additional information. Please  also refer  to " Patient Self Inventory" in the Media  tab for reviewed elements of pertinent patient history.  Forestine Igo participated in the discussions, expressed understanding, and voiced agreement with the above plans.  All questions were answered to her satisfaction. she is encouraged to contact clinic should she have any questions or concerns prior to her return visit.  Follow up plan: - Return in about 3 months (around 02/27/2024) for F/U with Pre-visit Labs, Meter/CGM/Logs, A1c here.  Kalvin Orf, MD [facility] [address] [place], [place] [address] Phone: [phone]  Fax: [phone]  11/27/2023, 4:34 PM  This note was partially dictated with voice recognition software. Similar sounding words can be transcribed inadequately or may not  be corrected upon review.

## 2023-11-28 ENCOUNTER — Encounter: Payer: Self-pay | Admitting: Gastroenterology

## 2023-11-28 ENCOUNTER — Telehealth (INDEPENDENT_AMBULATORY_CARE_PROVIDER_SITE_OTHER): Admitting: Gastroenterology

## 2023-11-28 VITALS — Ht 62.0 in | Wt 94.0 lb

## 2023-11-28 DIAGNOSIS — R748 Abnormal levels of other serum enzymes: Secondary | ICD-10-CM | POA: Diagnosis not present

## 2023-11-28 DIAGNOSIS — R768 Other specified abnormal immunological findings in serum: Secondary | ICD-10-CM

## 2023-11-28 DIAGNOSIS — R197 Diarrhea, unspecified: Secondary | ICD-10-CM

## 2023-11-28 DIAGNOSIS — D649 Anemia, unspecified: Secondary | ICD-10-CM | POA: Insufficient documentation

## 2023-11-28 DIAGNOSIS — E43 Unspecified severe protein-calorie malnutrition: Secondary | ICD-10-CM

## 2023-11-28 NOTE — Patient Instructions (Addendum)
 Please complete labs and stool tests. I am also checking again on your anemia, I did not mention this to you during your virtual visit. I will have Tammy, CMA, see if you can complete these tests at [facility].   We will be in touch with results and recommendations.   Please monitor for abdominal pain after meals, let us  know if you start having problems. This can be a sign of not enough blood flow to your intestines.
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
## 2023-12-02 ENCOUNTER — Other Ambulatory Visit: Payer: Self-pay | Admitting: Internal Medicine

## 2023-12-07 ENCOUNTER — Telehealth: Payer: Self-pay

## 2023-12-07 ENCOUNTER — Other Ambulatory Visit: Payer: Self-pay | Admitting: "Endocrinology

## 2023-12-07 MED ORDER — FREESTYLE LIBRE 3 READER DEVI: 1.0000 | Freq: Once | 0 refills | Status: DC | PRN

## 2023-12-07 MED ORDER — FREESTYLE LIBRE 3 PLUS SENSOR MISC: 2 refills | Status: DC

## 2023-12-07 NOTE — Telephone Encounter (Signed)
 Spoke with pt advising her to discontinue glipizide per Dr.Nida. Understanding voiced. Pt stated she would like to try using a CGM, requested a Rx.

## 2023-12-07 NOTE — Telephone Encounter (Signed)
 Noted

## 2023-12-07 NOTE — Telephone Encounter (Signed)
 Pt called stating she has experienced hypoglycemic episodes over the past weekend. States her BG dropped to 44 an hour after eating supper Saturday. Pt states she did not skip any meals, not taking any antibiotics or treatment for any infection.  Pt BG readings.   Date Before breakfast Before lunch Before supper Bedtime  12/05/23 182  127 89  12/06/23 153 318 117 88  12/07/23 204             Pt taking: Humalog  75/25 20 units bid and Glipizide 5mg  daily.

## 2023-12-09 ENCOUNTER — Telehealth: Payer: Self-pay | Admitting: Gastroenterology

## 2023-12-09 DIAGNOSIS — R911 Solitary pulmonary nodule: Secondary | ICD-10-CM

## 2023-12-09 NOTE — Telephone Encounter (Signed)
 Recent virtual visit, please make sure patient has lab orders for stools and labs. She had planned to go to [facility] to do them. But I can't tell if anyone mailed her orders.   If we have not sent her the orders, please change out the fecal lactoferrin to fecal calprotectin.   Also, please ask patient if she ever had the chest CT done for the lung nodule found 11/2022. She was supposed to have six month follow up and she told me last year that her pcp was ordering but I don't see where it has been done.

## 2023-12-12 NOTE — Telephone Encounter (Signed)
 Please schedule:  Low dose chest CT without contrast for 7.10mm pulmonary nodule within subpleural right lower lobe seen on CT 11/2022.

## 2023-12-12 NOTE — Addendum Note (Signed)
 Addended by: Beatrice Lin on: 12/12/2023 10:18 AM   Modules accepted: Orders

## 2023-12-12 NOTE — Telephone Encounter (Signed)
 Spoke with pt and she stated that she had not received lab orders yet. Resubmitted stool labs so that they would all be on the same order. Advised pt to let lab know that she had blood work and stool labs to be completed. Pt also stated that PCP has not ordered the chest CT yet.

## 2023-12-13 NOTE — Telephone Encounter (Signed)
 noted

## 2023-12-13 NOTE — Telephone Encounter (Addendum)
 PA approved Authorization Number: [id number] Case Number: [id number] Review Date: 12/13/2023 1:37:55 PM Expiration Date: 06/10/2024 Status: Your case has been Approved.---    CT scheduled for 6/10, arrival 12:45pm. Called pt, she stated she was going to let her PCP schedule it. She wants this appointment cancelled and will have PCP do it. Appt cancelled. FYI

## 2023-12-13 NOTE — Addendum Note (Signed)
 Addended by: Feliz Hosteller on: 12/13/2023 01:46 PM   Modules accepted: Orders

## 2023-12-15 DIAGNOSIS — G8929 Other chronic pain: Secondary | ICD-10-CM | POA: Diagnosis not present

## 2023-12-15 DIAGNOSIS — J449 Chronic obstructive pulmonary disease, unspecified: Secondary | ICD-10-CM | POA: Diagnosis not present

## 2023-12-15 DIAGNOSIS — Z515 Encounter for palliative care: Secondary | ICD-10-CM | POA: Diagnosis not present

## 2023-12-15 DIAGNOSIS — R630 Anorexia: Secondary | ICD-10-CM | POA: Diagnosis not present

## 2023-12-15 DIAGNOSIS — I5032 Chronic diastolic (congestive) heart failure: Secondary | ICD-10-CM | POA: Diagnosis not present

## 2023-12-15 DIAGNOSIS — R197 Diarrhea, unspecified: Secondary | ICD-10-CM | POA: Diagnosis not present

## 2023-12-15 DIAGNOSIS — R059 Cough, unspecified: Secondary | ICD-10-CM | POA: Diagnosis not present

## 2023-12-15 DIAGNOSIS — F339 Major depressive disorder, recurrent, unspecified: Secondary | ICD-10-CM | POA: Diagnosis not present

## 2023-12-26 ENCOUNTER — Telehealth: Payer: Self-pay

## 2023-12-26 ENCOUNTER — Other Ambulatory Visit: Payer: Self-pay | Admitting: "Endocrinology

## 2023-12-26 ENCOUNTER — Other Ambulatory Visit (HOSPITAL_COMMUNITY)

## 2023-12-26 MED ORDER — NOVOLOG 70/30 FLEXPEN RELION (70-30) 100 UNIT/ML ~~LOC~~ SUPN: 30.0000 [IU] | PEN_INJECTOR | Freq: Two times a day (BID) | SUBCUTANEOUS | 1 refills | Status: DC

## 2023-12-26 NOTE — Telephone Encounter (Signed)
 Pt called stating her BG has been elevated for the past 2-3 days. Pt BG readings.   Date Before breakfast Before lunch Before supper Bedtime  12/24/23 226  419   12/25/23 278  477   06/10 313             Pt taking: Humalog  75/25 20 units bid with meals. Pt states her insulin  has been clear in color until her last 2 pens they are cloudy. Pt states her insurance will no longer cover Humalog . States she has not felt well for the past few days experiencing abdominal discomfort. Please advise.

## 2023-12-27 NOTE — Telephone Encounter (Signed)
 Spoke with pt advising her we will change her Humalog  75/25 to Novolog  70/30 to see if her insurance will cover and in the mean time requested pt increase her insulin  to 30 units with breakfast and 30 units with supper per Dr.Nida's orders. Also advised her if she continues to experience abdominal pain to go to the ER for further evaluation per Dr.Nida. Pt voiced understanding.

## 2024-01-01 DIAGNOSIS — Z87891 Personal history of nicotine dependence: Secondary | ICD-10-CM | POA: Diagnosis not present

## 2024-01-01 DIAGNOSIS — F17210 Nicotine dependence, cigarettes, uncomplicated: Secondary | ICD-10-CM | POA: Diagnosis not present

## 2024-01-01 DIAGNOSIS — R911 Solitary pulmonary nodule: Secondary | ICD-10-CM | POA: Diagnosis not present

## 2024-01-01 DIAGNOSIS — I2510 Atherosclerotic heart disease of native coronary artery without angina pectoris: Secondary | ICD-10-CM | POA: Diagnosis not present

## 2024-01-16 ENCOUNTER — Other Ambulatory Visit: Payer: Self-pay

## 2024-01-16 DIAGNOSIS — E43 Unspecified severe protein-calorie malnutrition: Secondary | ICD-10-CM | POA: Diagnosis not present

## 2024-01-16 DIAGNOSIS — E1122 Type 2 diabetes mellitus with diabetic chronic kidney disease: Secondary | ICD-10-CM

## 2024-01-16 DIAGNOSIS — R197 Diarrhea, unspecified: Secondary | ICD-10-CM | POA: Diagnosis not present

## 2024-01-16 DIAGNOSIS — D649 Anemia, unspecified: Secondary | ICD-10-CM | POA: Diagnosis not present

## 2024-01-16 MED ORDER — PEN NEEDLES 32G X 6 MM MISC: 2 refills | Status: DC

## 2024-01-17 DIAGNOSIS — R197 Diarrhea, unspecified: Secondary | ICD-10-CM | POA: Diagnosis not present

## 2024-01-17 DIAGNOSIS — E43 Unspecified severe protein-calorie malnutrition: Secondary | ICD-10-CM | POA: Diagnosis not present

## 2024-01-17 DIAGNOSIS — D649 Anemia, unspecified: Secondary | ICD-10-CM | POA: Diagnosis not present

## 2024-01-17 LAB — CBC WITH DIFFERENTIAL/PLATELET
Basophils Absolute: 0 10*3/uL (ref 0.0–0.2)
Basos: 1 %
EOS (ABSOLUTE): 0.1 10*3/uL (ref 0.0–0.4)
Eos: 1 %
Hematocrit: 37.9 % (ref 34.0–46.6)
Hemoglobin: 11.9 g/dL (ref 11.1–15.9)
Immature Grans (Abs): 0 10*3/uL (ref 0.0–0.1)
Immature Granulocytes: 0 %
Lymphocytes Absolute: 1.2 10*3/uL (ref 0.7–3.1)
Lymphs: 16 %
MCH: 30.4 pg (ref 26.6–33.0)
MCHC: 31.4 g/dL — ABNORMAL LOW (ref 31.5–35.7)
MCV: 97 fL (ref 79–97)
Monocytes Absolute: 0.5 10*3/uL (ref 0.1–0.9)
Monocytes: 6 %
Neutrophils Absolute: 5.6 10*3/uL (ref 1.4–7.0)
Neutrophils: 76 %
Platelets: 358 10*3/uL (ref 150–450)
RBC: 3.92 x10E6/uL (ref 3.77–5.28)
RDW: 12.2 % (ref 11.7–15.4)
WBC: 7.4 10*3/uL (ref 3.4–10.8)

## 2024-01-17 LAB — B12 AND FOLATE PANEL
Folate: >20 ng/mL (ref >3.0)
Vitamin B-12: 1229 pg/mL (ref 232–1245)

## 2024-01-17 LAB — IRON,TIBC AND FERRITIN PANEL
Ferritin: 104 ng/mL (ref 15–150)
Iron Saturation: 12 % — ABNORMAL LOW (ref 15–55)
Iron: 37 ug/dL (ref 27–139)
Total Iron Binding Capacity: 297 ug/dL (ref 250–450)
UIBC: 260 ug/dL (ref 118–369)

## 2024-01-18 ENCOUNTER — Ambulatory Visit: Payer: Self-pay | Admitting: Gastroenterology

## 2024-01-18 LAB — GI PROFILE, STOOL, PCR

## 2024-01-18 LAB — PANCREATIC ELASTASE, FECAL: Pancreatic Elastase, Fecal: 10 ug Elast./g — ABNORMAL LOW (ref >200)

## 2024-01-18 LAB — CALPROTECTIN, FECAL: Calprotectin, Fecal: 58 ug/g (ref 0–120)

## 2024-01-22 DIAGNOSIS — Z681 Body mass index (BMI) 19 or less, adult: Secondary | ICD-10-CM | POA: Diagnosis not present

## 2024-01-22 DIAGNOSIS — L989 Disorder of the skin and subcutaneous tissue, unspecified: Secondary | ICD-10-CM | POA: Diagnosis not present

## 2024-01-22 NOTE — Telephone Encounter (Signed)
 Pt was made aware and verbalized understanding. Appt was made for July 30th at 3 pm.

## 2024-02-02 ENCOUNTER — Other Ambulatory Visit: Payer: Self-pay | Admitting: Gastroenterology

## 2024-02-14 ENCOUNTER — Ambulatory Visit: Admitting: Gastroenterology

## 2024-02-14 ENCOUNTER — Encounter: Payer: Self-pay | Admitting: Gastroenterology

## 2024-02-14 VITALS — BP 136/80 | HR 93 | Temp 98.9°F | Ht 62.0 in | Wt 99.6 lb

## 2024-02-14 DIAGNOSIS — D649 Anemia, unspecified: Secondary | ICD-10-CM | POA: Diagnosis not present

## 2024-02-14 DIAGNOSIS — K8681 Exocrine pancreatic insufficiency: Secondary | ICD-10-CM | POA: Insufficient documentation

## 2024-02-14 DIAGNOSIS — R760 Raised antibody titer: Secondary | ICD-10-CM

## 2024-02-14 DIAGNOSIS — R197 Diarrhea, unspecified: Secondary | ICD-10-CM | POA: Diagnosis not present

## 2024-02-14 DIAGNOSIS — R748 Abnormal levels of other serum enzymes: Secondary | ICD-10-CM

## 2024-02-14 NOTE — Patient Instructions (Signed)
 Continue Creon  2 with meals and 2 with snacks.  Continue pantoprazole  40mg  once to twice daily for acid reflux.   Continue imodium two tablets every morning.   I will discuss next step with Dr. Cindie. You may need a colonoscopy to rule out microscopic colitis and/or evaluation to rule out poor blood flow to your intestines. He may recommended trial of medications. We will be in touch with further recommendations.

## 2024-02-14 NOTE — Progress Notes (Signed)
 GI Office Note    Referring Provider: Skillman, Katherine E, * Primary Care Physician:  Skillman, Katherine E, PA-C  Primary Gastroenterologist: Carlin POUR. Cindie, DO   Chief Complaint   Chief Complaint  Patient presents with   Follow-up    States that she has issues with diarrhea, gas and abd pain mostly in the mornings.    History of Present Illness   Ashlee Austin is a 68 y.o. female presenting today for follow up. Last seen 11/2023. She has history of anemia, elevated AP, and diarrhea, pancreatic insufficiency.   Discussed the use of AI scribe software for clinical note transcription with the patient, who gave verbal consent to proceed.    Chronic diarrhea - Duration approximately one year, onset December of previous year - Bowel movements four to five times daily, primarily in the mornings - Significant urgency, often leading to accidents - On good days, two bowel movements per day - Rarely experiences solid stools; prior to onset, bowel habits were normal with solid stools and occasional constipation - Diarrhea does not typically wake her at night, but occasionally wakes to urinate and sometimes defecates during these times (about twice a month) - No blood in stool - Diarrhea not associated with eating; no postprandial worsening - No frequent nausea or vomiting - No heartburn, indigestion, or dysphagia -01/17/2024, pancreatic elastase 10 -01/17/2024, GI profile, +norovirus -01/17/2024, fecal calprotectin 58 borderline  Abdominal symptoms - Significant gas and occasional cramping, attributed to gas rather than pain - No abdominal pain associated with eating  Weight changes and nutritional status - Weight increased from 87 pounds in January to 99 pounds currently - Previously weighed up to 160 pounds years ago, weights availabe in epic date to 2019 (99lb) - Fears eating due to diarrhea but no postprandial abdominal pain. symptoms impacting ability to plan activities  and travel  Pancreatic enzyme replacement therapy and antidiarrheal use - Taking Creon , two capsules with each meal and snacks - Taking Imodium, two tablets in the morning - No significant improvement in symptoms with current regimen    Wt Readings from Last 10 Encounters:  02/14/24 99 lb 9.6 oz (45.2 kg)  11/28/23 94 lb (42.6 kg)  11/27/23 94 lb 9.6 oz (42.9 kg)  10/17/23 91 lb 9.6 oz (41.5 kg)  08/08/23 87 lb (39.5 kg)  08/02/23 87 lb 6.4 oz (39.6 kg)  06/27/23 85 lb (38.6 kg)  05/23/23 83 lb (37.6 kg)  03/31/23 80 lb (36.3 kg)  03/10/22 84 lb 3.5 oz (38.2 kg)    In 06/2023: ANA positive, AMA negative, anti-GP 210 negative, anti-SP 100 negative, ASMA negative, TIBC 275, iron 44, iron sat 16, ferritin 128, hemoglobin 10.3, hematocrit 33.3, white blood cell count 8400, platelets 392,000, B12 greater than 2000, folate greater than 20, alkaline phosphatase isoenzymes with 79% from bone fraction, magnesium  1.6, GGT 29, TSH 3.246, CRP 0.7, TTG IgA less than 2, lipase 29, creatinine 1.25, alkaline phosphatase 182, total bilirubin 0.4, AST 21, ALT 28    Seen by endocrinology for elevated AP, bone scan completed and showed no evidence of Paget's disease per Dr. Lenis.    I referred her to Dr. 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 She had appt previously for other vascular issues but asked that it was addressed at the same time. Patient cancelled her appt stating she was not having any problems.    01/2024: pancreatic elastase 10,  gi path +norovirus, fecal calprotectin 58 (borderline), B12 1229, folate >20, iron 37, tibc 297, iron sat 12, ferritin 104, wbc 7.4, hgb 11.9, hct 37.9, plt 358  Patient prefers to avoid colonoscopy if possible.    CT chest/abd/pelvis without contrast 11/2022: 1. No acute intrathoracic or intra-abdominal pathology identified.  No definite radiographic explanation for the patient's reported   symptoms.  2. Extensive multi-vessel coronary artery calcification.  3. Mild emphysema.  4. Minimal right nonobstructing nephrolithiasis.  5. Moderate colonic stool burden without evidence of obstruction.  6. Extensive aortoiliac atherosclerotic calcification. Particularly  prominent atherosclerotic calcification is seen at the renal and  mesenteric arterial origins, however, the degree of stenosis is not  well assessed on this noncontrast examination. If there is clinical  evidence of chronic mesenteric ischemia or hemodynamically  significant renal artery stenosis, CT arteriography may be more  helpful for further evaluation.  7. 5 mm pulmonary nodule within the subpleural right lower lobe is  new from prior examination, axial image # 74/4, and is  indeterminate. Short-term follow-up in 6 months is recommended with  repeat low-dose chest CT without contrast (please use the following  order, CT CHEST LCS NODULE FOLLOW-UP W/O CM).    CTA A/P 08/2023: IMPRESSION: 1. Atheromatous aorta with patent mesenteric arteries. 2. No acute findings. Celiac: Partially calcified ostial plaque resulting in short segment stenosis of at least mild severity, patent distally with classic trifurcation anatomy. SMA: Calcified ostial plaque resulting in short segment mild stenosis, atheromatous but patent distally with classic distal branch anatomy. IMA: Origin occlusion, patent distally.  Colonoscopy 10/2020: -non-bleeding internal hemorrhoids -diverticulosis -three 4-73mm polyps in transverse , tubular adenoma -next colonoscopy five years.    EGD 10/2020: -non-severe candidiasis esophagitis with no bleeding. Cells for cytology positive for candida, treated -gastritis, reactive gastropathy, no h.pylori   Medications   Current Outpatient Medications  Medication Sig Dispense Refill   albuterol (VENTOLIN HFA) 108 (90 Base) MCG/ACT inhaler Inhale 2 puffs into the lungs every 4 (four) hours as  needed.     ascorbic acid (VITAMIN C) 500 MG tablet Take 500 mg by mouth daily.     atorvastatin (LIPITOR) 80 MG tablet Take 80 mg by mouth at bedtime.     Cholecalciferol (VITAMIN D3) 10 MCG (400 UNIT) CAPS Take 1 capsule by mouth daily.     cilostazol  (PLETAL ) 100 MG tablet Take 100 mg by mouth 2 (two) times daily.     CREON  36000-114000 units CPEP capsule TAKE TWO CAPSULES WITH MEALS AND 1 CAPSULE WITH SNACKS, UP TO 8 CAPSULES DAILY 200 capsule 5   Cyanocobalamin  (VITAMIN B12 PO) Take 1 tablet by mouth daily.     DULoxetine (CYMBALTA) 60 MG capsule Take 60 mg by mouth daily.     EMBECTA PEN NEEDLE NANO 2 GEN 32G X 4 MM MISC      ENTRESTO 24-26 MG Take 1 tablet by mouth 2 (two) times daily.     feeding supplement (ENSURE ENLIVE / ENSURE PLUS) LIQD Take 237 mLs by mouth 3 (three) times daily between meals. 237 mL 12   glipiZIDE (GLUCOTROL XL) 5 MG 24 hr tablet Take 5 mg by mouth daily.     insulin  aspart protamine - aspart (NOVOLOG  70/30 FLEXPEN) (70-30) 100 UNIT/ML FlexPen Inject 30 Units into the skin 2 (two) times daily before a meal. 30 mL 1   metoprolol succinate (TOPROL-XL) 25 MG 24 hr tablet Take 25 mg by mouth daily.     Multiple Vitamin (MULTIVITAMIN  WITH MINERALS) TABS tablet Take 1 tablet by mouth daily.     ondansetron  (ZOFRAN ) 4 MG tablet Take 4 mg by mouth every 8 (eight) hours as needed.     oxyCODONE (OXY IR/ROXICODONE) 5 MG immediate release tablet Take 5 mg by mouth every 8 (eight) hours as needed.     pantoprazole  (PROTONIX ) 40 MG tablet TAKE 1 TABLET(40 MG) BY MOUTH TWICE DAILY 60 tablet 5   Probiotic Product (PROBIOTIC PO) Take 1 tablet by mouth daily.     Continuous Glucose Receiver (FREESTYLE LIBRE 3 READER) DEVI 1 Piece by Does not apply route once as needed for up to 1 dose. (Patient not taking: Reported on 02/14/2024) 1 each 0   Continuous Glucose Sensor (FREESTYLE LIBRE 3 PLUS SENSOR) MISC Change sensor every 15 days. (Patient not taking: Reported on 02/14/2024) 2 each 2    No current facility-administered medications for this visit.    Allergies   Allergies as of 02/14/2024 - Review Complete 02/14/2024  Allergen Reaction Noted   Codeine Itching 03/31/2023     Past Medical History   Past Medical History:  Diagnosis Date   Carotid artery disease (HCC)    COPD (chronic obstructive pulmonary disease) (HCC)    Coronary atherosclerosis of native coronary artery    [facility]; negative Dobutamine echo 6/11   Depression    Essential hypertension    Hyperlipidemia    Hypothyroidism    Stroke (HCC) 2020   Stroke [facility]) 2014   Type 2 diabetes mellitus (HCC)     Past Surgical History   Past Surgical History:  Procedure Laterality Date   BIOPSY  10/20/2020   Procedure: BIOPSY;  Surgeon: Cindie Carlin POUR, DO;  Location: [facility];  Service: Endoscopy;;   CARPAL TUNNEL RELEASE     COLONOSCOPY WITH PROPOFOL  N/A 10/20/2020   Procedure: COLONOSCOPY WITH PROPOFOL ;  Surgeon: Cindie Carlin POUR, DO;  Location: [facility];  Service: Endoscopy;  Laterality: N/A;  pm (early as possible), diabetic   CYSTECTOMY     Spine   ESOPHAGOGASTRODUODENOSCOPY (EGD) WITH PROPOFOL  N/A 10/20/2020   Procedure: ESOPHAGOGASTRODUODENOSCOPY (EGD) WITH PROPOFOL ;  Surgeon: Cindie Carlin POUR, DO;  Location: [facility];  Service: Endoscopy;  Laterality: N/A;   TOTAL ABDOMINAL HYSTERECTOMY  2004   TYMPANOPLASTY      Past Family History   Family History  Problem Relation Age of Onset   Diabetes Mother    Coronary artery disease Mother        MI age 89   Heart attack Mother    Lung cancer Father    Diabetes Sister     Past Social History   Social History   Socioeconomic History   Marital status: Widowed    Spouse name: Not on file   Number of children: Not on file   Years of education: Not on file   Highest education level: Not on file  Occupational History    Employer: [employer]    Comment: Full time at [employer]  Tobacco Use   Smoking status:  Every Day    Current packs/day: 0.00    Average packs/day: 1 pack/day for 47.7 years (47.7 ttl pk-yrs)    Types: Cigarettes    Start date: 01/31/1972    Last attempt to quit: 10/17/2019    Years since quitting: 4.3   Smokeless tobacco: Never  Vaping Use   Vaping status: Former  Substance and Sexual Activity   Alcohol  use: No   Drug use:  No   Sexual activity: Not on file  Other Topics Concern   Not on file  Social History Narrative   Not on file   Social Drivers of Health   Financial Resource Strain: Low Risk  (01/12/2023)   Received from [facility]   Overall Financial Resource Strain (CARDIA)    Difficulty of Paying Living Expenses: Not hard at all  Food Insecurity: No Food Insecurity (01/12/2023)   Received from [facility]   Hunger Vital Sign    Within the past 12 months, you worried that your food would run out before you got the money to buy more.: Never true    Within the past 12 months, the food you bought just didn't last and you didn't have money to get more.: Never true  Transportation Needs: No Transportation Needs (01/12/2023)   Received from [facility]   PRAPARE - Transportation    Lack of Transportation (Medical): No    Lack of Transportation (Non-Medical): No  Physical Activity: Inactive (09/19/2022)   Received from [facility]   Exercise Vital Sign    On average, how many days per week do you engage in moderate to strenuous exercise (like a brisk walk)?: 0 days    On average, how many minutes do you engage in exercise at this level?: 0 min  Stress: No Stress Concern Present (09/19/2022)   Received from [facility] - Occupational Stress Questionnaire    Feeling of Stress : Not at all  Social Connections: Moderately Isolated (09/19/2022)   Received from [facility]   Social Connection and Isolation Panel    In a typical week, how many times do you talk on the phone with family, friends, or neighbors?:  Three times a week    How often do you get together with friends or relatives?: Never    How often do you attend church or religious services?: More than 4 times per year    Do you belong to any clubs or organizations such as church groups, unions, fraternal or athletic groups, or school groups?: No    How often do you attend meetings of the clubs or organizations you belong to?: Never    Are you married, widowed, divorced, separated, never married, or living with a partner?: Widowed  Intimate Partner Violence: Not At Risk (09/19/2022)   Received from [facility]   Humiliation, Afraid, Rape, and Kick questionnaire    Within the last year, have you been afraid of your partner or ex-partner?: No    Within the last year, have you been humiliated or emotionally abused in other ways by your partner or ex-partner?: No    Within the last year, have you been kicked, hit, slapped, or otherwise physically hurt by your partner or ex-partner?: No    Within the last year, have you been raped or forced to have any kind of sexual activity by your partner or ex-partner?: No    Review of Systems   General: Negative for anorexia, weight loss, fever, chills, +fatigue, weakness. ENT: Negative for hoarseness, difficulty swallowing , nasal congestion. CV: Negative for chest pain, angina, palpitations, dyspnea on exertion, peripheral edema.  Respiratory: Negative for dyspnea at rest, dyspnea on exertion, +cough, sputum, wheezing.  GI: See history of present illness. GU:  Negative for dysuria, hematuria, urinary incontinence, urinary frequency, nocturnal urination.  Endo: Negative for unusual weight change.     Physical Exam  BP 136/80 (BP Location: Right Arm, Patient Position: Sitting, Cuff Size: Normal)   Pulse 93   Temp 98.9 F (37.2 C) (Oral)   Ht 5' 2 (1.575 m)   Wt 99 lb 9.6 oz (45.2 kg)   SpO2 95%   BMI 18.22 kg/m    General:thin, well-developed in no acute distress.  Eyes: No  icterus. Mouth: Oropharyngeal mucosa moist and pink   Lungs: Clear to auscultation bilaterally.  Heart: Regular rate and rhythm, no murmurs rubs or gallops.  Abdomen: Bowel sounds are normal, nontender, nondistended, no hepatosplenomegaly or masses,  no abdominal bruits or hernia , no rebound or guarding.  Rectal: not performed Extremities: No lower extremity edema. No clubbing or deformities. Neuro: Alert and oriented x 4   Skin: Warm and dry, no jaundice.   Psych: Alert and cooperative, normal mood and affect.  Labs   See hpi Imaging Studies   No results found.  Assessment/Plan:     Diarrhea: -etiology not clear. No response with Creon  or holding magnesium  and metformin . Failed Levsin . Imodium and lomotil not helpful. CTA with plaque at SMA origin with short segment mild stenosis but patent distally. Celiac ostial plaque resulting in short segment stenosis of mild severity, patent distally, IMA origin occulsion, patent distally. I requested she discussed with Dr. Gretta at her follow up visit but she cancelled her appt as she did not feel she was having any problems.  -cannot rule out bile salt diarrhea. Could consider colestid. -low fecal elastase suggesting EPI but no response with weight based Creon .  -fecal calprotectin borerline positive -recent gi profile, +norovirus, would not explain chronic symptoms -to discuss with Dr. Cindie. Patient may still benefit from seeing vascular to rule out mesenteric ischemia although she does not have a significant postprandial abdominal pain. Other options are colonoscopy to rule out microscopic colitis vs trial of budesonide vs trial of bile salt binder.     Anemia: -no evidence of B12, folate deficiency. Ferritin normal, iron low normal. Iron sat 12% -normal Hgb in 2022, since 2023 Hgb in 9-10 range. Recent Hgb 11.9 normal -colonoscopy/EGD in 2022 as outlined above -no overt GI bleeding   Elevated alk phos: -appears to be due to bone  source with 79% from bone on isoenzymes.  -seen by endocrinology, Paget's disease ruled out -follow labs   ANA positive: -no indication of autoimmune liver disease -denies joint/muscle pain, hold off on referral to rheumatology        Sonny RAMAN. Ezzard, MHS, PA-C [facility]

## 2024-02-16 DIAGNOSIS — I5032 Chronic diastolic (congestive) heart failure: Secondary | ICD-10-CM | POA: Diagnosis not present

## 2024-02-16 DIAGNOSIS — R197 Diarrhea, unspecified: Secondary | ICD-10-CM | POA: Diagnosis not present

## 2024-02-16 DIAGNOSIS — R630 Anorexia: Secondary | ICD-10-CM | POA: Diagnosis not present

## 2024-02-16 DIAGNOSIS — Z515 Encounter for palliative care: Secondary | ICD-10-CM | POA: Diagnosis not present

## 2024-02-16 DIAGNOSIS — M545 Low back pain, unspecified: Secondary | ICD-10-CM | POA: Diagnosis not present

## 2024-02-16 DIAGNOSIS — G8929 Other chronic pain: Secondary | ICD-10-CM | POA: Diagnosis not present

## 2024-02-16 DIAGNOSIS — J449 Chronic obstructive pulmonary disease, unspecified: Secondary | ICD-10-CM | POA: Diagnosis not present

## 2024-02-22 DIAGNOSIS — N183 Chronic kidney disease, stage 3 unspecified: Secondary | ICD-10-CM | POA: Diagnosis not present

## 2024-02-22 DIAGNOSIS — L820 Inflamed seborrheic keratosis: Secondary | ICD-10-CM | POA: Diagnosis not present

## 2024-02-22 DIAGNOSIS — L821 Other seborrheic keratosis: Secondary | ICD-10-CM | POA: Diagnosis not present

## 2024-02-22 DIAGNOSIS — E1122 Type 2 diabetes mellitus with diabetic chronic kidney disease: Secondary | ICD-10-CM | POA: Diagnosis not present

## 2024-02-23 LAB — COMPREHENSIVE METABOLIC PANEL WITH GFR
ALT: 19 IU/L (ref 0–32)
AST: 19 IU/L (ref 0–40)
Albumin: 4.4 g/dL (ref 3.9–4.9)
Alkaline Phosphatase: 79 IU/L (ref 44–121)
BUN/Creatinine Ratio: 29 — ABNORMAL HIGH (ref 12–28)
BUN: 39 mg/dL — ABNORMAL HIGH (ref 8–27)
Bilirubin Total: <0.2 mg/dL (ref 0.0–1.2)
CO2: 19 mmol/L — ABNORMAL LOW (ref 20–29)
Calcium: 9.6 mg/dL (ref 8.7–10.3)
Chloride: 101 mmol/L (ref 96–106)
Creatinine, Ser: 1.35 mg/dL — ABNORMAL HIGH (ref 0.57–1.00)
Globulin, Total: 2.4 g/dL (ref 1.5–4.5)
Glucose: 188 mg/dL — ABNORMAL HIGH (ref 70–99)
Potassium: 4.6 mmol/L (ref 3.5–5.2)
Sodium: 137 mmol/L (ref 134–144)
Total Protein: 6.8 g/dL (ref 6.0–8.5)
eGFR: 43 mL/min/1.73 — ABNORMAL LOW (ref >59)

## 2024-02-23 LAB — LIPID PANEL
Chol/HDL Ratio: 2.4 ratio (ref 0.0–4.4)
Cholesterol, Total: 126 mg/dL (ref 100–199)
HDL: 53 mg/dL (ref >39)
LDL Chol Calc (NIH): 48 mg/dL (ref 0–99)
Triglycerides: 144 mg/dL (ref 0–149)
VLDL Cholesterol Cal: 25 mg/dL (ref 5–40)

## 2024-02-28 ENCOUNTER — Ambulatory Visit: Admitting: "Endocrinology

## 2024-02-28 ENCOUNTER — Encounter: Payer: Self-pay | Admitting: "Endocrinology

## 2024-02-28 VITALS — BP 118/66 | HR 76 | Ht 62.0 in | Wt 99.4 lb

## 2024-02-28 DIAGNOSIS — I10 Essential (primary) hypertension: Secondary | ICD-10-CM | POA: Diagnosis not present

## 2024-02-28 DIAGNOSIS — E1122 Type 2 diabetes mellitus with diabetic chronic kidney disease: Secondary | ICD-10-CM | POA: Diagnosis not present

## 2024-02-28 DIAGNOSIS — Z794 Long term (current) use of insulin: Secondary | ICD-10-CM | POA: Diagnosis not present

## 2024-02-28 DIAGNOSIS — F17200 Nicotine dependence, unspecified, uncomplicated: Secondary | ICD-10-CM

## 2024-02-28 DIAGNOSIS — E782 Mixed hyperlipidemia: Secondary | ICD-10-CM | POA: Diagnosis not present

## 2024-02-28 DIAGNOSIS — N183 Chronic kidney disease, stage 3 unspecified: Secondary | ICD-10-CM | POA: Diagnosis not present

## 2024-02-28 DIAGNOSIS — R748 Abnormal levels of other serum enzymes: Secondary | ICD-10-CM | POA: Diagnosis not present

## 2024-02-28 LAB — POCT GLYCOSYLATED HEMOGLOBIN (HGB A1C): HbA1c, POC (controlled diabetic range): 8.6 % — AB (ref 0.0–7.0)

## 2024-02-28 MED ORDER — NOVOLOG 70/30 FLEXPEN RELION (70-30) 100 UNIT/ML ~~LOC~~ SUPN: 25.0000 [IU] | PEN_INJECTOR | Freq: Two times a day (BID) | SUBCUTANEOUS | 1 refills | Status: AC

## 2024-02-28 NOTE — Progress Notes (Signed)
 02/28/2024, 4:46 PM   Endocrinology follow-up note  Subjective:    Patient ID: Ashlee Austin, female    DOB: 23-Feb-1956.  Ashlee Austin is being seen in consultation for management of currently uncontrolled symptomatic diabetes requested by  Skillman, Katherine E, PA-C.   Past Medical History:  Diagnosis Date   Carotid artery disease (HCC)    COPD (chronic obstructive pulmonary disease) (HCC)    Coronary atherosclerosis of native coronary artery    [facility]; negative Dobutamine echo 6/11   Depression    Essential hypertension    Hyperlipidemia    Hypothyroidism    Stroke (HCC) 2020   Stroke [facility]) 2014   Type 2 diabetes mellitus (HCC)     Past Surgical History:  Procedure Laterality Date   BIOPSY  10/20/2020   Procedure: BIOPSY;  Surgeon: Cindie Carlin POUR, DO;  Location: [facility];  Service: Endoscopy;;   CARPAL TUNNEL RELEASE     COLONOSCOPY WITH PROPOFOL  N/A 10/20/2020   Procedure: COLONOSCOPY WITH PROPOFOL ;  Surgeon: Cindie Carlin POUR, DO;  Location: [facility];  Service: Endoscopy;  Laterality: N/A;  pm (early as possible), diabetic   CYSTECTOMY     Spine   ESOPHAGOGASTRODUODENOSCOPY (EGD) WITH PROPOFOL  N/A 10/20/2020   Procedure: ESOPHAGOGASTRODUODENOSCOPY (EGD) WITH PROPOFOL ;  Surgeon: Cindie Carlin POUR, DO;  Location: [facility];  Service: Endoscopy;  Laterality: N/A;   TOTAL ABDOMINAL HYSTERECTOMY  2004   TYMPANOPLASTY      Social History   Socioeconomic History   Marital status: Widowed    Spouse name: Not on file   Number of children: Not on file   Years of education: Not on file   Highest education level: Not on file  Occupational History    Employer: [employer]    Comment: Full time at [employer]  Tobacco Use   Smoking status: Every Day    Current packs/day: 0.00    Average packs/day: 1 pack/day for 47.7 years (47.7 ttl pk-yrs)    Types: Cigarettes     Start date: 01/31/1972    Last attempt to quit: 10/17/2019    Years since quitting: 4.3   Smokeless tobacco: Never  Vaping Use   Vaping status: Former  Substance and Sexual Activity   Alcohol  use: No   Drug use: No   Sexual activity: Not on file  Other Topics Concern   Not on file  Social History Narrative   Not on file   Social Drivers of Health   Financial Resource Strain: Low Risk  (01/12/2023)   Received from [facility]   Overall Financial Resource Strain (CARDIA)    Difficulty of Paying Living Expenses: Not hard at all  Food Insecurity: No Food Insecurity (01/12/2023)   Received from [facility]   Hunger Vital Sign    Within the past 12 months, you worried that your food would run out before you got the money to buy more.: Never true    Within the past 12 months, the food you bought just didn't last and you didn't have money to get more.: Never true  Transportation  Needs: No Transportation Needs (01/12/2023)   Received from [facility] - Transportation    Lack of Transportation (Medical): No    Lack of Transportation (Non-Medical): No  Physical Activity: Inactive (09/19/2022)   Received from [facility]   Exercise Vital Sign    On average, how many days per week do you engage in moderate to strenuous exercise (like a brisk walk)?: 0 days    On average, how many minutes do you engage in exercise at this level?: 0 min  Stress: No Stress Concern Present (09/19/2022)   Received from [facility] - Occupational Stress Questionnaire    Feeling of Stress : Not at all  Social Connections: Moderately Isolated (09/19/2022)   Received from [facility]   Social Connection and Isolation Panel    In a typical week, how many times do you talk on the phone with family, friends, or neighbors?: Three times a week    How often do you get together with friends or relatives?: Never    How often do you attend church or  religious services?: More than 4 times per year    Do you belong to any clubs or organizations such as church groups, unions, fraternal or athletic groups, or school groups?: No    How often do you attend meetings of the clubs or organizations you belong to?: Never    Are you married, widowed, divorced, separated, never married, or living with a partner?: Widowed    Family History  Problem Relation Age of Onset   Diabetes Mother    Coronary artery disease Mother        MI age 60   Heart attack Mother    Lung cancer Father    Diabetes Sister     Outpatient Encounter Medications as of 02/28/2024  Medication Sig   albuterol (VENTOLIN HFA) 108 (90 Base) MCG/ACT inhaler Inhale 2 puffs into the lungs every 4 (four) hours as needed.   ascorbic acid (VITAMIN C) 500 MG tablet Take 500 mg by mouth daily.   atorvastatin (LIPITOR) 80 MG tablet Take 80 mg by mouth at bedtime.   Cholecalciferol (VITAMIN D3) 10 MCG (400 UNIT) CAPS Take 1 capsule by mouth daily.   cilostazol  (PLETAL ) 100 MG tablet Take 100 mg by mouth 2 (two) times daily.   Continuous Glucose Receiver (FREESTYLE LIBRE 3 READER) DEVI 1 Piece by Does not apply route once as needed for up to 1 dose. (Patient not taking: Reported on 02/14/2024)   Continuous Glucose Sensor (FREESTYLE LIBRE 3 PLUS SENSOR) MISC Change sensor every 15 days. (Patient not taking: Reported on 02/14/2024)   CREON  36000-114000 units CPEP capsule TAKE TWO CAPSULES WITH MEALS AND 1 CAPSULE WITH SNACKS, UP TO 8 CAPSULES DAILY   Cyanocobalamin  (VITAMIN B12 PO) Take 1 tablet by mouth daily.   DULoxetine (CYMBALTA) 60 MG capsule Take 60 mg by mouth daily.   EMBECTA PEN NEEDLE NANO 2 GEN 32G X 4 MM MISC    ENTRESTO 24-26 MG Take 1 tablet by mouth 2 (two) times daily.   feeding supplement (ENSURE ENLIVE / ENSURE PLUS) LIQD Take 237 mLs by mouth 3 (three) times daily between meals.   glipiZIDE (GLUCOTROL XL) 5 MG 24 hr tablet Take 5 mg by mouth daily.   insulin  aspart  protamine - aspart (NOVOLOG  70/30 FLEXPEN) (70-30) 100 UNIT/ML FlexPen Inject 25-30 Units into the skin 2 (two) times daily before a  meal.   metoprolol succinate (TOPROL-XL) 25 MG 24 hr tablet Take 25 mg by mouth daily.   Multiple Vitamin (MULTIVITAMIN WITH MINERALS) TABS tablet Take 1 tablet by mouth daily.   ondansetron  (ZOFRAN ) 4 MG tablet Take 4 mg by mouth every 8 (eight) hours as needed.   oxyCODONE (OXY IR/ROXICODONE) 5 MG immediate release tablet Take 5 mg by mouth every 8 (eight) hours as needed.   pantoprazole  (PROTONIX ) 40 MG tablet TAKE 1 TABLET(40 MG) BY MOUTH TWICE DAILY   Probiotic Product (PROBIOTIC PO) Take 1 tablet by mouth daily.   [DISCONTINUED] insulin  aspart protamine - aspart (NOVOLOG  70/30 FLEXPEN) (70-30) 100 UNIT/ML FlexPen Inject 30 Units into the skin 2 (two) times daily before a meal.   No facility-administered encounter medications on file as of 02/28/2024.    ALLERGIES: Allergies  Allergen Reactions   Codeine Itching    VACCINATION STATUS:  There is no immunization history on file for this patient.  Diabetes She presents for her follow-up diabetic visit. She has type 2 diabetes mellitus. Her disease course has been improving. There are no hypoglycemic associated symptoms. Pertinent negatives for hypoglycemia include no confusion, headaches, pallor or seizures. Pertinent negatives for diabetes include no chest pain, no fatigue, no polydipsia, no polyphagia and no polyuria. There are no hypoglycemic complications. Symptoms are improving. Diabetic complications include a CVA, heart disease and nephropathy. Risk factors for coronary artery disease include dyslipidemia, hypertension, tobacco exposure, post-menopausal and sedentary lifestyle. Current diabetic treatment includes insulin  injections. Her weight is increasing steadily. She is following a generally unhealthy diet. When asked about meal planning, she reported none. She has not had a previous visit with a  dietitian. She never participates in exercise. Her home blood glucose trend is decreasing steadily. Her breakfast blood glucose range is generally 180-200 mg/dl. Her lunch blood glucose range is generally 180-200 mg/dl. Her dinner blood glucose range is generally 180-200 mg/dl. Her bedtime blood glucose range is generally 180-200 mg/dl. Her overall blood glucose range is 180-200 mg/dl. (She presents with her logs showing significant improvement in her glycemic profile, still above target.  Her point-of-care A1c is 8.6% improving from 13.3%.  She did have rare, random, and rare hypoglycemia.    ) An ACE inhibitor/angiotensin II receptor blocker is being taken.   -Elevated alkaline phosphatase: This is patient's other concern.  She was observed to have elevated alkaline phosphatase of unclear etiology.  She has multiple medical problems including type 2 diabetes, exocrine pancreatic insufficiency, hypertension, coronary artery disease, CVA, pain syndrome. Her lab work so far showed stage 3 CKD.  She did have normal GGT. denies any prior history of bone disorders.  She was found to have hypervitaminosis B12 until recently when she was advised to stop. Her previsit labs show significant improvement in her alkaline phosphatase to 79 dropping from 157. She has history of chronic diarrhea on ongoing workup and GI follow-up.  She does have positive ANA antibodies, iron deficiency anemia. Her previsit bone scan did not reveal evidence of Paget's disease.     Objective:       02/28/2024    2:40 PM 02/14/2024    3:00 PM 02/14/2024    2:55 PM  Vitals with BMI  Height 5' 2  5' 2  Weight 99 lbs 6 oz  99 lbs 10 oz  BMI 18.18  18.21  Systolic 118 136 839  Diastolic 66 80 75  Pulse 76 93 98    BP 118/66   Pulse 76  Ht 5' 2 (1.575 m)   Wt 99 lb 6.4 oz (45.1 kg)   BMI 18.18 kg/m   Wt Readings from Last 3 Encounters:  02/28/24 99 lb 6.4 oz (45.1 kg)  02/14/24 99 lb 9.6 oz (45.2 kg)  11/28/23 94 lb  (42.6 kg)      CMP ( most recent) CMP     Component Value Date/Time   NA 137 02/22/2024 1542   K 4.6 02/22/2024 1542   CL 101 02/22/2024 1542   CO2 19 (L) 02/22/2024 1542   GLUCOSE 188 (H) 02/22/2024 1542   GLUCOSE 302 (H) 06/27/2023 1501   BUN 39 (H) 02/22/2024 1542   CREATININE 1.35 (H) 02/22/2024 1542   CALCIUM 9.6 02/22/2024 1542   PROT 6.8 02/22/2024 1542   ALBUMIN 4.4 02/22/2024 1542   AST 19 02/22/2024 1542   ALT 19 02/22/2024 1542   ALKPHOS 79 02/22/2024 1542   BILITOT <0.2 02/22/2024 1542   EGFR 43 (L) 02/22/2024 1542   GFRNONAA 47 (L) 06/27/2023 1501    Diabetic Labs (most recent): Lab Results  Component Value Date   HGBA1C 8.6 (A) 02/28/2024   HGBA1C 13.3 09/20/2023   HGBA1C 6.0 (H) 03/10/2022     Lipid Panel ( most recent) Lipid Panel     Component Value Date/Time   CHOL 126 02/22/2024 1542   TRIG 144 02/22/2024 1542   HDL 53 02/22/2024 1542   CHOLHDL 2.4 02/22/2024 1542   CHOLHDL 3.5 03/11/2022 0047   VLDL 26 03/11/2022 0047   LDLCALC 48 02/22/2024 1542   LABVLDL 25 02/22/2024 1542      Lab Results  Component Value Date   TSH 3.246 06/27/2023   TSH 1.825 03/10/2022   FREET4 1.03 06/27/2023       Assessment & Plan:   1. Elevated alkaline phosphatase level (Primary) -Her most recent labs continue to show improvement in her alkaline phosphatase now back to normal at 79 from 212 in November 2024 .  Etiology unclear, likely multifactorial.  Her previsit bone scan did not reveal evidence of Paget's disease.  She will be put on expectant management with plan to repeat CMP before her next visit.     2. Diabetes mellitus with stage 3 chronic kidney disease (HCC)   - JAYLIN ROUNDY has currently uncontrolled symptomatic type 2 DM  with most recent A1c of 13.3%. Recent labs reviewed.  She presents with her logs showing significant improvement in her glycemic profile, still above target.  Her point-of-care A1c is 8.6% improving from 13.3%.  She  did have rare, random, and rare hypoglycemia.     - I had a long discussion with her about the possible risk factors and  the pathology behind diabetes and its complications. -her diabetes is complicated by coronary artery disease, CVA, CKD, PAD and she remains at exceedingly high risk for more acute and chronic complications which include CAD, CVA, CKD, retinopathy, and neuropathy. These are all discussed in detail with her.  - I discussed all available options of managing her diabetes including de-escalation of medications. I have counseled her on Food as Medicine by adopting a Whole Food , Plant Predominant  ( WFPP) nutrition as recommended by [employer] of Lifestyle Medicine. Patient is encouraged to switch to  unprocessed or minimally processed  complex starch, adequate protein intake (mainly plant source), minimal liquid fat, plenty of fruits, and vegetables. -  she is advised to stick to a routine mealtimes to eat 3 complete meals a day  and snack only when necessary (to snack only to correct hypoglycemia BG <70 day time or <100 at night).   - she acknowledges that there is a room for improvement in her food and drink choices. - Further Specific Suggestion is made for her to avoid simple carbohydrates  from her diet including Cakes, Sweet Desserts, Ice Cream, Soda (diet and regular), Sweet Tea, Candies, Chips, Cookies, Store Bought Juices, Alcohol  ,  Artificial Sweeteners,  Coffee Creamer, and Sugar-free Products. This will help patient to have more stable blood glucose profile and potentially avoid unintended weight gain.  - I have approached her with the following individualized plan to manage  her diabetes and patient agrees:   - She is responding to that premixed insulin  instead of Lantus .    - To avoid diarrhea, inadvertent hypoglycemic episodes, she is advised to lower NovoLog  70/30 to 30 units with breakfast and 25 units with supper  when her Premeal blood glucose readings are  above 90 mg per DL and only when she is eating.    She has a CGM at home, she did not start using it.  She is advised to make a nurse visit for education and application of her first sensor.  In the meantime, she will continue monitoring blood glucose 4 times a day-daily before meals and at bedtime.  She is encouraged to call clinic for hypoglycemia below 70 or hyperglycemia above 300 mg per DL. -For the time being, she is advised to continue glipizide 5 mg XL p.o. daily at breakfast. -She is not a suitable candidate for GLP-1 receptors nor SGLT2 inhibitors due to low BMI.  - Specific targets for  A1c;  LDL, HDL,  and Triglycerides were discussed with the patient.  3) Blood Pressure /Hypertension:    Her blood pressure is controlled to target.   she is advised to continue her current medications including Entresto 24-26 mg p.o. daily with breakfast along with her metoprolol 25 mg p.o. daily at breakfast.  4) Lipids/Hyperlipidemia:   Review of her recent lipid panel showed  controlled  LDL at 48 .  she  is exceedingly high risk for cardiovascular disease recurrence, advised to continue atorvastatin 80 mg po daily at bedtime.    Side effects and precautions discussed with her.  5)  Weight/Diet:  Body mass index is 18.18 kg/m.  -   she is not a candidate for weight loss.  She is not a suitable candidate for GLP-1 receptor agonists.  6) Chronic Care/Health Maintenance:  -she  is on ACEI/ARB and Statin medications and  is encouraged to initiate and continue to follow up with Ophthalmology, Dentist,  Podiatrist at least yearly or according to recommendations, and advised to  quit smoking. I have recommended yearly flu vaccine and pneumonia vaccine at least every 5 years; moderate intensity exercise for up to 150 minutes weekly; and  sleep for 7- 9 hours a day.  The patient was counseled on the dangers of tobacco use, and was advised to quit.  Reviewed strategies to maximize success, including  removing cigarettes and smoking materials from environment.   - she is  advised to maintain close follow up with Skillman, Katherine E, PA-C for primary care needs, as well as her other providers for optimal and coordinated care.   I spent  41  minutes in the care of the patient today including review of labs from CMP, Lipids, Thyroid  Function, Hematology (current and previous including abstractions from other facilities); face-to-face time discussing  her blood glucose readings/logs, discussing hypoglycemia and hyperglycemia episodes and symptoms, medications doses, her options of short and long term treatment based on the latest standards of care / guidelines;  discussion about incorporating lifestyle medicine;  and documenting the encounter. Risk reduction counseling performed per USPSTF guidelines to reduce cardiovascular risk factors.     Please refer to Patient Instructions for Blood Glucose Monitoring and Insulin /Medications Dosing Guide  in media tab for additional information. Please  also refer to  Patient Self Inventory in the Media  tab for reviewed elements of pertinent patient history.  Deane GORMAN Bull participated in the discussions, expressed understanding, and voiced agreement with the above plans.  All questions were answered to her satisfaction. she is encouraged to contact clinic should she have any questions or concerns prior to her return visit.   Follow up plan: - Return in about 4 months (around 06/29/2024) for Bring Meter/CGM Device/Logs- A1c in Office.  Ranny Earl, MD [employer] [facility] [address] [place], [place] [address] Phone: [phone]  Fax: [phone]    02/28/2024, 4:46 PM  This note was partially dictated with voice recognition software. Similar sounding words can be transcribed inadequately or may not  be corrected upon review.

## 2024-02-28 NOTE — Patient Instructions (Signed)

## 2024-02-29 ENCOUNTER — Ambulatory Visit

## 2024-03-01 ENCOUNTER — Telehealth: Payer: Self-pay

## 2024-03-01 NOTE — Telephone Encounter (Signed)
 Pt called wanting to know if you discussed medication options with Dr. Cindie. Pt stated that she was told that she would hear back regarding this and has not.

## 2024-03-04 ENCOUNTER — Other Ambulatory Visit: Payer: Self-pay | Admitting: "Endocrinology

## 2024-03-04 NOTE — Telephone Encounter (Signed)
 I have reached out again to Dr. Cindie, hopefully have some news today.

## 2024-03-04 NOTE — Telephone Encounter (Signed)
 noted

## 2024-03-05 MED ORDER — CHOLESTYRAMINE 4 GM/DOSE PO POWD: ORAL | 3 refills | Status: DC

## 2024-03-05 NOTE — Telephone Encounter (Signed)
 Pt was made aware and verbalized understanding. F/u appt was made.

## 2024-03-05 NOTE — Addendum Note (Signed)
 Addended by: EZZARD SONNY RAMAN on: 03/05/2024 01:41 PM   Modules accepted: Orders

## 2024-03-05 NOTE — Telephone Encounter (Signed)
 Please let her know that Dr. Cindie is recommending trial of cholestyramine  powder to try to firm of stool. I would suggest continue creon . If she does not get better then he recommends colonoscopy with colon biopsies.    I will send in cholestyramine . Be sure to tell her to not to take other medications within 3 hours of this one as it will decreased efficacy of her other medications.   If she gets constipation, she should let us  know so we can reduce dose.   Return ov in 3 weeks.

## 2024-03-07 ENCOUNTER — Ambulatory Visit (INDEPENDENT_AMBULATORY_CARE_PROVIDER_SITE_OTHER)

## 2024-03-07 ENCOUNTER — Other Ambulatory Visit: Payer: Self-pay

## 2024-03-07 DIAGNOSIS — Z794 Long term (current) use of insulin: Secondary | ICD-10-CM

## 2024-03-07 DIAGNOSIS — N183 Chronic kidney disease, stage 3 unspecified: Secondary | ICD-10-CM

## 2024-03-07 DIAGNOSIS — E1122 Type 2 diabetes mellitus with diabetic chronic kidney disease: Secondary | ICD-10-CM | POA: Diagnosis not present

## 2024-03-07 MED ORDER — FREESTYLE LIBRE 3 PLUS SENSOR MISC: 2 refills | Status: DC

## 2024-03-07 NOTE — Progress Notes (Signed)
 Pt seen as a Nurse Visit for CGM start up and instruction. Discussed, instructed and demonstrated  how to use Freestyle Richey 3 reader and product features. Discussed, instructed and demonstrated insertion of Freestyle Libre 3 sensor. Sensor inserted into upper L arm without difficulty and was paired with pt's reader. Pt verbalized and demonstrated understanding. Answered all pt's questions and advised her to contact the office if she had any other questions.

## 2024-03-11 ENCOUNTER — Telehealth: Payer: Self-pay

## 2024-03-11 NOTE — Telephone Encounter (Signed)
 Pt called stating that the colestipol is causing her to have constipation. Please advise.

## 2024-03-11 NOTE — Telephone Encounter (Signed)
 Pt called stating she had experienced hypoglycemia twice yesterday. States she had taken her Glipizide 5mg  yesterday morning and injected 30 units of Novolog  70/30 at breakfast and 25 units at supper. States around approximately 6:30pm her glucose was 53 and then dropped again to 63 at 12:00am. Discussed pt's symptoms and medication with Dr.Nida. Advised pt to continue Glipizide 5mg  daily, decrease Novolog  70/30 to 25 units each morning with breakfast and 20 units in the evening with supper per Dr.Nida's orders. Also requested pt to bring her Libre CGM reader to the office so we can upload her data. Pt voiced understanding of all instructions.

## 2024-03-12 NOTE — Telephone Encounter (Signed)
 Yes, I'm sorry cholestyramine 

## 2024-03-12 NOTE — Telephone Encounter (Signed)
 Pt was made aware and verbalized understanding.

## 2024-03-12 NOTE — Telephone Encounter (Signed)
 Do you mean cholestyramine ?  She can hold cholestyramine  until stools are soft, then restart at half dose (2 grams) daily, do not take within 3 hours of other medications.

## 2024-03-12 NOTE — Telephone Encounter (Signed)
 Ok let's have her cut dose in half as outlined below.

## 2024-03-26 ENCOUNTER — Telehealth: Payer: Self-pay

## 2024-03-26 NOTE — Telephone Encounter (Signed)
 Pt called stating her blood glucose has been dropping approximately an hour and a half after taking her insulin  at supper time. States she eats supper around 4:30pm. States she is taking Novolog  70/30 25 units at breakfast and had been taking 20 units at supper. Stated for the past 3 evenings she has experienced hypoglycemic episodes with her glucose dropping into the 50s. States she cut her evening dose down to 15 units still experienced a drop and then 10 units the next evening and still experienced her glucose drop into the 50s. States last night she did not take her evening dose and did not experience any drops in her glucose.  Pt  BG readings.   Date Before breakfast Before lunch Before supper Bedtime  03/24/24 200  171   03/25/24 192  151   03/26/24 183            Pt is going to bring her CGM reader to the office tomorrow so we can upload her data.

## 2024-03-27 NOTE — Telephone Encounter (Signed)
 Pt brought her Libre CGM reader to the office, data was uploaded, printed and given to Dr.Nida for evaluation. Advised pt to continue injecting her Novolog  70/30 25 units with breakfast and to inject 10 units at supper per Dr.Nida after his evaluation of pt's CGM data. Discussed with pt the need to insure she eats enough healthy, good portioned meals for breakfast, lunch and dinner. Pt voiced understanding.

## 2024-04-02 DIAGNOSIS — J449 Chronic obstructive pulmonary disease, unspecified: Secondary | ICD-10-CM | POA: Diagnosis not present

## 2024-04-02 DIAGNOSIS — R630 Anorexia: Secondary | ICD-10-CM | POA: Diagnosis not present

## 2024-04-02 DIAGNOSIS — G8929 Other chronic pain: Secondary | ICD-10-CM | POA: Diagnosis not present

## 2024-04-02 DIAGNOSIS — Z515 Encounter for palliative care: Secondary | ICD-10-CM | POA: Diagnosis not present

## 2024-04-02 DIAGNOSIS — M545 Low back pain, unspecified: Secondary | ICD-10-CM | POA: Diagnosis not present

## 2024-04-02 DIAGNOSIS — R059 Cough, unspecified: Secondary | ICD-10-CM | POA: Diagnosis not present

## 2024-04-15 ENCOUNTER — Ambulatory Visit: Admitting: Gastroenterology

## 2024-04-15 NOTE — Progress Notes (Deleted)
 GI Office Note    Referring Provider: Skillman, Katherine E, * Primary Care Physician:  Skillman, Katherine E, PA-C  Primary Gastroenterologist: Carlin POUR. Cindie, DO   Chief Complaint   No chief complaint on file.   History of Present Illness   Ashlee Austin is a 68 y.o. female presenting today for follow up. Last seen in 01/2024. She has h/o anemia, elevated AP, diarrhea, pancreatic insufficiency.  At last ov, she continued to have diarrhea, Creon  not helping. We added cholestyramine  4g daily but had to reduce to 2g daily due to constipation.   Prior Data    In 06/2023: ANA positive, AMA negative, anti-GP 210 negative, anti-SP 100 negative, ASMA negative, TIBC 275, iron 44, iron sat 16, ferritin 128, hemoglobin 10.3, hematocrit 33.3, white blood cell count 8400, platelets 392,000, B12 greater than 2000, folate greater than 20, alkaline phosphatase isoenzymes with 79% from bone fraction, magnesium  1.6, GGT 29, TSH 3.246, CRP 0.7, TTG IgA less than 2, lipase 29, creatinine 1.25, alkaline phosphatase 182, total bilirubin 0.4, AST 21, ALT 28    Seen by endocrinology for elevated AP, bone scan completed and showed no evidence of Paget's disease per Dr. Lenis.    I referred her to Dr. 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 She had appt previously for other vascular issues but asked that it was addressed at the same time. Patient cancelled her appt stating she was not having any problems.    01/2024: pancreatic elastase 10, gi path +norovirus, fecal calprotectin 58 (borderline), B12 1229, folate >20, iron 37, tibc 297, iron sat 12, ferritin 104, wbc 7.4, hgb 11.9, hct 37.9, plt 358   Patient prefers to avoid colonoscopy if possible.    CT chest/abd/pelvis without contrast 11/2022: 1. No acute intrathoracic or intra-abdominal pathology identified.  No definite radiographic explanation for the patient's  reported  symptoms.  2. Extensive multi-vessel coronary artery calcification.  3. Mild emphysema.  4. Minimal right nonobstructing nephrolithiasis.  5. Moderate colonic stool burden without evidence of obstruction.  6. Extensive aortoiliac atherosclerotic calcification. Particularly  prominent atherosclerotic calcification is seen at the renal and  mesenteric arterial origins, however, the degree of stenosis is not  well assessed on this noncontrast examination. If there is clinical  evidence of chronic mesenteric ischemia or hemodynamically  significant renal artery stenosis, CT arteriography may be more  helpful for further evaluation.  7. 5 mm pulmonary nodule within the subpleural right lower lobe is  new from prior examination, axial image # 74/4, and is  indeterminate. Short-term follow-up in 6 months is recommended with  repeat low-dose chest CT without contrast (please use the following  order, CT CHEST LCS NODULE FOLLOW-UP W/O CM).    CTA A/P 08/2023: IMPRESSION: 1. Atheromatous aorta with patent mesenteric arteries. 2. No acute findings. Celiac: Partially calcified ostial plaque resulting in short segment stenosis of at least mild severity, patent distally with classic trifurcation anatomy. SMA: Calcified ostial plaque resulting in short segment mild stenosis, atheromatous but patent distally with classic distal branch anatomy. IMA: Origin occlusion, patent distally.  Colonoscopy 10/2020: -non-bleeding internal hemorrhoids -diverticulosis -three 4-65mm polyps in transverse , tubular adenoma -next colonoscopy five years.    EGD 10/2020: -non-severe candidiasis esophagitis with no bleeding. Cells for cytology positive for candida, treated -gastritis, reactive gastropathy, no h.pylori         Medications   Current Outpatient Medications  Medication Sig  Dispense Refill   albuterol (VENTOLIN HFA) 108 (90 Base) MCG/ACT inhaler Inhale 2 puffs into the lungs every 4  (four) hours as needed.     ascorbic acid (VITAMIN C) 500 MG tablet Take 500 mg by mouth daily.     atorvastatin (LIPITOR) 80 MG tablet Take 80 mg by mouth at bedtime.     Cholecalciferol (VITAMIN D3) 10 MCG (400 UNIT) CAPS Take 1 capsule by mouth daily.     cholestyramine  (QUESTRAN ) 4 GM/DOSE powder Take 4 grams daily. Do not take within 3 hours of other medications. 378 g 3   cilostazol  (PLETAL ) 100 MG tablet Take 100 mg by mouth 2 (two) times daily.     Continuous Glucose Receiver (FREESTYLE LIBRE 3 READER) DEVI 1 PIECE BY DOES NOT APPLY ROUTE ONCE AS NEEDED FOR UP TO 1 DOSE. 1 each 0   Continuous Glucose Sensor (FREESTYLE LIBRE 3 PLUS SENSOR) MISC Change sensor every 15 days. 2 each 2   CREON  36000-114000 units CPEP capsule TAKE TWO CAPSULES WITH MEALS AND 1 CAPSULE WITH SNACKS, UP TO 8 CAPSULES DAILY 200 capsule 5   Cyanocobalamin  (VITAMIN B12 PO) Take 1 tablet by mouth daily.     DULoxetine (CYMBALTA) 60 MG capsule Take 60 mg by mouth daily.     EMBECTA PEN NEEDLE NANO 2 GEN 32G X 4 MM MISC      ENTRESTO 24-26 MG Take 1 tablet by mouth 2 (two) times daily.     feeding supplement (ENSURE ENLIVE / ENSURE PLUS) LIQD Take 237 mLs by mouth 3 (three) times daily between meals. 237 mL 12   glipiZIDE (GLUCOTROL XL) 5 MG 24 hr tablet Take 5 mg by mouth daily.     insulin  aspart protamine - aspart (NOVOLOG  70/30 FLEXPEN) (70-30) 100 UNIT/ML FlexPen Inject 25-30 Units into the skin 2 (two) times daily before a meal. 30 mL 1   metoprolol succinate (TOPROL-XL) 25 MG 24 hr tablet Take 25 mg by mouth daily.     Multiple Vitamin (MULTIVITAMIN WITH MINERALS) TABS tablet Take 1 tablet by mouth daily.     ondansetron  (ZOFRAN ) 4 MG tablet Take 4 mg by mouth every 8 (eight) hours as needed.     oxyCODONE (OXY IR/ROXICODONE) 5 MG immediate release tablet Take 5 mg by mouth every 8 (eight) hours as needed.     pantoprazole  (PROTONIX ) 40 MG tablet TAKE 1 TABLET(40 MG) BY MOUTH TWICE DAILY 60 tablet 5   Probiotic  Product (PROBIOTIC PO) Take 1 tablet by mouth daily.     No current facility-administered medications for this visit.    Allergies   Allergies as of 04/15/2024 - Review Complete 02/28/2024  Allergen Reaction Noted   Codeine Itching 03/31/2023     Past Medical History   Past Medical History:  Diagnosis Date   Carotid artery disease    COPD (chronic obstructive pulmonary disease) (HCC)    Coronary atherosclerosis of native coronary artery    [facility]; negative Dobutamine echo 6/11   Depression    Essential hypertension    Hyperlipidemia    Hypothyroidism    Stroke (HCC) 2020   Stroke [facility]) 2014   Type 2 diabetes mellitus (HCC)     Past Surgical History   Past Surgical History:  Procedure Laterality Date   BIOPSY  10/20/2020   Procedure: BIOPSY;  Surgeon: Cindie Carlin POUR, DO;  [facility];  Service: Endoscopy;;   CARPAL TUNNEL RELEASE     COLONOSCOPY WITH PROPOFOL  N/A  10/20/2020   Procedure: COLONOSCOPY WITH PROPOFOL ;  Surgeon: Cindie Carlin POUR, DO;  [facility];  Service: Endoscopy;  Laterality: N/A;  pm (early as possible), diabetic   CYSTECTOMY     Spine   ESOPHAGOGASTRODUODENOSCOPY (EGD) WITH PROPOFOL  N/A 10/20/2020   Procedure: ESOPHAGOGASTRODUODENOSCOPY (EGD) WITH PROPOFOL ;  Surgeon: Cindie Carlin POUR, DO;  [facility];  Service: Endoscopy;  Laterality: N/A;   TOTAL ABDOMINAL HYSTERECTOMY  2004   TYMPANOPLASTY      Past Family History   Family History  Problem Relation Age of Onset   Diabetes Mother    Coronary artery disease Mother        MI age 60   Heart attack Mother    Lung cancer Father    Diabetes Sister     Past Social History   Social History   Socioeconomic History   Marital status: Widowed    Spouse name: Not on file   Number of children: Not on file   Years of education: Not on file   Highest education level: Not on file  Occupational History    Employer: [employer]    Comment: Full time at [employer]  Tobacco Use   Smoking status: Every Day    Current packs/day: 0.00    Average packs/day: 1 pack/day for 47.7 years (47.7 ttl pk-yrs)    Types: Cigarettes    Start date: 01/31/1972    Last attempt to quit: 10/17/2019    Years since quitting: 4.4   Smokeless tobacco: Never  Vaping Use   Vaping status: Former  Substance and Sexual Activity   Alcohol  use: No   Drug use: No   Sexual activity: Not on file  Other Topics Concern   Not on file  Social History Narrative   Not on file   Social Drivers of Health   Financial Resource Strain: Low Risk  (01/12/2023)   Received from [facility]   Overall Financial Resource Strain (CARDIA)    Difficulty of Paying Living Expenses: Not hard at all  Food Insecurity: No Food Insecurity (01/12/2023)   Received from [facility]   Hunger Vital Sign    Within the past 12 months, you worried that your food would run out before you got the money to buy more.: Never true    Within the past 12 months, the food you bought just didn't last and you didn't have money to get more.: Never true  Transportation Needs: No Transportation Needs (01/12/2023)   Received from [facility]   PRAPARE - Transportation    Lack of Transportation (Medical): No    Lack of Transportation (Non-Medical): No  Physical Activity: Inactive (09/19/2022)   Received from [facility]   Exercise Vital Sign    On average, how many days per week do you engage in moderate to strenuous exercise (like a brisk walk)?: 0 days    On average, how many minutes do you engage in exercise at this level?: 0 min  Stress: No Stress Concern Present (09/19/2022)   Received from [facility] - Occupational Stress Questionnaire    Feeling of Stress : Not at all  Social Connections: Moderately Isolated (09/19/2022)   Received from [facility]   Social Connection and Isolation Panel    In a typical week, how many times do you talk  on the phone with family, friends, or neighbors?: Three times a week  How often do you get together with friends or relatives?: Never    How often do you attend church or religious services?: More than 4 times per year    Do you belong to any clubs or organizations such as church groups, unions, fraternal or athletic groups, or school groups?: No    How often do you attend meetings of the clubs or organizations you belong to?: Never    Are you married, widowed, divorced, separated, never married, or living with a partner?: Widowed  Intimate Partner Violence: Not At Risk (09/19/2022)   Received from [facility]   Humiliation, Afraid, Rape, and Kick questionnaire    Within the last year, have you been afraid of your partner or ex-partner?: No    Within the last year, have you been humiliated or emotionally abused in other ways by your partner or ex-partner?: No    Within the last year, have you been kicked, hit, slapped, or otherwise physically hurt by your partner or ex-partner?: No    Within the last year, have you been raped or forced to have any kind of sexual activity by your partner or ex-partner?: No    Review of Systems   General: Negative for anorexia, weight loss, fever, chills, fatigue, weakness. ENT: Negative for hoarseness, difficulty swallowing , nasal congestion. CV: Negative for chest pain, angina, palpitations, dyspnea on exertion, peripheral edema.  Respiratory: Negative for dyspnea at rest, dyspnea on exertion, cough, sputum, wheezing.  GI: See history of present illness. GU:  Negative for dysuria, hematuria, urinary incontinence, urinary frequency, nocturnal urination.  Endo: Negative for unusual weight change.     Physical Exam   There were no vitals taken for this visit.   General: Well-nourished, well-developed in no acute distress.  Eyes: No icterus. Mouth: Oropharyngeal mucosa moist and pink   Lungs: Clear to auscultation bilaterally.  Heart: Regular rate  and rhythm, no murmurs rubs or gallops.  Abdomen: Bowel sounds are normal, nontender, nondistended, no hepatosplenomegaly or masses,  no abdominal bruits or hernia , no rebound or guarding.  Rectal: not performed Extremities: No lower extremity edema. No clubbing or deformities. Neuro: Alert and oriented x 4   Skin: Warm and dry, no jaundice.   Psych: Alert and cooperative, normal mood and affect.  Labs   Lab Results  Component Value Date   HGBA1C 8.6 (A) 02/28/2024   Lab Results  Component Value Date   NA 137 02/22/2024   CL 101 02/22/2024   K 4.6 02/22/2024   CO2 19 (L) 02/22/2024   BUN 39 (H) 02/22/2024   CREATININE 1.35 (H) 02/22/2024   EGFR 43 (L) 02/22/2024   CALCIUM 9.6 02/22/2024   PHOS 3.9 03/15/2022   ALBUMIN 4.4 02/22/2024   GLUCOSE 188 (H) 02/22/2024   Lab Results  Component Value Date   ALT 19 02/22/2024   AST 19 02/22/2024   GGT 29 06/27/2023   ALKPHOS 79 02/22/2024   BILITOT <0.2 02/22/2024   Lab Results  Component Value Date   WBC 7.4 01/16/2024   HGB 11.9 01/16/2024   HCT 37.9 01/16/2024   MCV 97 01/16/2024   PLT 358 01/16/2024    Imaging Studies   No results found.  Assessment/Plan:           Sonny RAMAN. Ezzard, MHS, PA-C [facility]

## 2024-04-23 DIAGNOSIS — E119 Type 2 diabetes mellitus without complications: Secondary | ICD-10-CM | POA: Diagnosis not present

## 2024-04-23 LAB — LAB REPORT - SCANNED
A1c: 8.1
EGFR: 41.6
TSH: 1.16 (ref 0.41–5.90)

## 2024-04-29 NOTE — Progress Notes (Unsigned)
 Patient name: Ashlee Austin MRN: [id number] DOB: 09/21/1955 Sex: female  REASON FOR CONSULT: 59-month follow-up PAD  HPI: Ashlee Austin is a 68 y.o. female, with hx carotid artery disease, COPD, CAD, HTN, HLD, DM, tobacco abuse that presents for 80-month follow-up of her PAD.   Previously seen with pain in both lower extremities worse in the left leg.  She did have ABIs that were 0.79 on the right and 0.84 on the left.  She had a palpable pulse in the left foot and we did not feel this was the cause of her foot pain.  I did put her on Pletal  after the last visit.  States she stopped taking this as she saw no improvement.  Again complains of cramping in her left leg with no pain in the right leg.  Feels this is tolerable and intermittent and sometimes at night.    Past Medical History:  Diagnosis Date   Carotid artery disease    COPD (chronic obstructive pulmonary disease) (HCC)    Coronary atherosclerosis of native coronary artery    [facility]; negative Dobutamine echo 6/11   Depression    Essential hypertension    Hyperlipidemia    Hypothyroidism    Stroke (HCC) 2020   Stroke [facility]) 2014   Type 2 diabetes mellitus (HCC)     Past Surgical History:  Procedure Laterality Date   BIOPSY  10/20/2020   Procedure: BIOPSY;  Surgeon: Cindie Carlin POUR, DO;  [facility];  Service: Endoscopy;;   CARPAL TUNNEL RELEASE     COLONOSCOPY WITH PROPOFOL  N/A 10/20/2020   Procedure: COLONOSCOPY WITH PROPOFOL ;  Surgeon: Cindie Carlin POUR, DO;  [facility];  Service: Endoscopy;  Laterality: N/A;  pm (early as possible), diabetic   CYSTECTOMY     Spine   ESOPHAGOGASTRODUODENOSCOPY (EGD) WITH PROPOFOL  N/A 10/20/2020   Procedure: ESOPHAGOGASTRODUODENOSCOPY (EGD) WITH PROPOFOL ;  Surgeon: Cindie Carlin POUR, DO;  [facility];  Service: Endoscopy;  Laterality: N/A;   TOTAL ABDOMINAL HYSTERECTOMY  2004   TYMPANOPLASTY      Family History  Problem Relation Age of Onset    Diabetes Mother    Coronary artery disease Mother        MI age 51   Heart attack Mother    Lung cancer Father    Diabetes Sister     SOCIAL HISTORY: Social History   Socioeconomic History   Marital status: Widowed    Spouse name: Not on file   Number of children: Not on file   Years of education: Not on file   Highest education level: Not on file  Occupational History    Employer: [employer]    Comment: Full time at [employer]  Tobacco Use   Smoking status: Every Day    Current packs/day: 0.00    Average packs/day: 1 pack/day for 47.7 years (47.7 ttl pk-yrs)    Types: Cigarettes    Start date: 01/31/1972    Last attempt to quit: 10/17/2019    Years since quitting: 4.5   Smokeless tobacco: Never  Vaping Use   Vaping status: Former  Substance and Sexual Activity   Alcohol  use: No   Drug use: No   Sexual activity: Not on file  Other Topics Concern   Not on file  Social History Narrative   Not on file   Social Drivers of Health   Financial Resource Strain: Low Risk  (01/12/2023)   Received from [facility]   Overall Financial Resource Strain (CARDIA)    Difficulty of Paying Living Expenses: Not hard at all  Food Insecurity: No Food Insecurity (01/12/2023)   Received from [facility]   Hunger Vital Sign    Within the past 12 months, you worried that your food would run out before you got the money to buy more.: Never true    Within the past 12 months, the food you bought just didn't last and you didn't have money to get more.: Never true  Transportation Needs: No Transportation Needs (01/12/2023)   Received from [facility]   PRAPARE - Transportation    Lack of Transportation (Medical): No    Lack of Transportation (Non-Medical): No  Physical Activity: Inactive (09/19/2022)   Received from [facility]   Exercise Vital Sign    On average, how many days per week do you engage in moderate to strenuous exercise (like a brisk walk)?: 0 days    On  average, how many minutes do you engage in exercise at this level?: 0 min  Stress: No Stress Concern Present (09/19/2022)   Received from [facility] - Occupational Stress Questionnaire    Feeling of Stress : Not at all  Social Connections: Moderately Isolated (09/19/2022)   Received from [facility]   Social Connection and Isolation Panel    In a typical week, how many times do you talk on the phone with family, friends, or neighbors?: Three times a week    How often do you get together with friends or relatives?: Never    How often do you attend church or religious services?: More than 4 times per year    Do you belong to any clubs or organizations such as church groups, unions, fraternal or athletic groups, or school groups?: No    How often do you attend meetings of the clubs or organizations you belong to?: Never    Are you married, widowed, divorced, separated, never married, or living with a partner?: Widowed  Intimate Partner Violence: Not At Risk (09/19/2022)   Received from [facility]   Humiliation, Afraid, Rape, and Kick questionnaire    Within the last year, have you been afraid of your partner or ex-partner?: No    Within the last year, have you been humiliated or emotionally abused in other ways by your partner or ex-partner?: No    Within the last year, have you been kicked, hit, slapped, or otherwise physically hurt by your partner or ex-partner?: No    Within the last year, have you been raped or forced to have any kind of sexual activity by your partner or ex-partner?: No    Allergies  Allergen Reactions   Codeine Itching    Current Outpatient Medications  Medication Sig Dispense Refill   albuterol (VENTOLIN HFA) 108 (90 Base) MCG/ACT inhaler Inhale 2 puffs into the lungs every 4 (four) hours as needed.     ascorbic acid (VITAMIN C) 500 MG tablet Take 500 mg by mouth daily.     atorvastatin (LIPITOR) 80 MG tablet Take  80 mg by mouth at bedtime.     Cholecalciferol (VITAMIN D3) 10 MCG (400 UNIT) CAPS Take 1 capsule by mouth daily.     cholestyramine  (QUESTRAN ) 4 GM/DOSE powder Take 4 grams daily. Do not take within 3 hours of other medications. 378 g 3   cilostazol  (PLETAL ) 100 MG tablet Take 100 mg  by mouth 2 (two) times daily.     Continuous Glucose Receiver (FREESTYLE LIBRE 3 READER) DEVI 1 PIECE BY DOES NOT APPLY ROUTE ONCE AS NEEDED FOR UP TO 1 DOSE. 1 each 0   Continuous Glucose Sensor (FREESTYLE LIBRE 3 PLUS SENSOR) MISC Change sensor every 15 days. 2 each 2   CREON  36000-114000 units CPEP capsule TAKE TWO CAPSULES WITH MEALS AND 1 CAPSULE WITH SNACKS, UP TO 8 CAPSULES DAILY 200 capsule 5   Cyanocobalamin  (VITAMIN B12 PO) Take 1 tablet by mouth daily.     DULoxetine (CYMBALTA) 60 MG capsule Take 60 mg by mouth daily.     EMBECTA PEN NEEDLE NANO 2 GEN 32G X 4 MM MISC      ENTRESTO 24-26 MG Take 1 tablet by mouth 2 (two) times daily.     feeding supplement (ENSURE ENLIVE / ENSURE PLUS) LIQD Take 237 mLs by mouth 3 (three) times daily between meals. 237 mL 12   glipiZIDE (GLUCOTROL XL) 5 MG 24 hr tablet Take 5 mg by mouth daily.     insulin  aspart protamine - aspart (NOVOLOG  70/30 FLEXPEN) (70-30) 100 UNIT/ML FlexPen Inject 25-30 Units into the skin 2 (two) times daily before a meal. 30 mL 1   metoprolol succinate (TOPROL-XL) 25 MG 24 hr tablet Take 25 mg by mouth daily.     Multiple Vitamin (MULTIVITAMIN WITH MINERALS) TABS tablet Take 1 tablet by mouth daily.     ondansetron  (ZOFRAN ) 4 MG tablet Take 4 mg by mouth every 8 (eight) hours as needed.     oxyCODONE (OXY IR/ROXICODONE) 5 MG immediate release tablet Take 5 mg by mouth every 8 (eight) hours as needed.     pantoprazole  (PROTONIX ) 40 MG tablet TAKE 1 TABLET(40 MG) BY MOUTH TWICE DAILY 60 tablet 5   Probiotic Product (PROBIOTIC PO) Take 1 tablet by mouth daily.     No current facility-administered medications for this visit.    REVIEW OF  SYSTEMS:  [X]  denotes positive finding, [ ]  denotes negative finding Cardiac  Comments:  Chest pain or chest pressure:    Shortness of breath upon exertion:    Short of breath when lying flat:    Irregular heart rhythm:        Vascular    Pain in calf, thigh, or hip brought on by ambulation: x left  Pain in feet at night that wakes you up from your sleep:     Blood clot in your veins:    Leg swelling:         Pulmonary    Oxygen at home:    Productive cough:     Wheezing:         Neurologic    Sudden weakness in arms or legs:     Sudden numbness in arms or legs:     Sudden onset of difficulty speaking or slurred speech:    Temporary loss of vision in one eye:     Problems with dizziness:         Gastrointestinal    Blood in stool:     Vomited blood:         Genitourinary    Burning when urinating:     Blood in urine:        Psychiatric    Major depression:         Hematologic    Bleeding problems:    Problems with blood clotting too easily:        Skin  Rashes or ulcers:        Constitutional    Fever or chills:      PHYSICAL EXAM: There were no vitals filed for this visit.  GENERAL: The patient is a well-nourished female, in no acute distress. The vital signs are documented above. CARDIAC: There is a regular rate and rhythm.  VASCULAR:  Bilateral femoral pulses palpable Right DP weakly palpable Left DP palpable PULMONARY: No respiratory distress. ABDOMEN: Soft and non-tender. MUSCULOSKELETAL: There are no major deformities or cyanosis. NEUROLOGIC: No focal weakness or paresthesias are detected. SKIN: There are no ulcers or rashes noted. PSYCHIATRIC: The patient has a normal affect.  DATA:   ABI's today were 0.8 right and 0.89 left (previously 0.79 on the right and 0.84 on the left)  Assessment/Plan:  68 y.o. female, with hx carotid artery disease, COPD, CAD, HTN, HLD, DM, tobacco abuse that presents for 6 month follow-up of her PAD.    Repeat  ABIs today show that her ABIs stable and actually slightly improved on the left.  Still has a palpable DP pulse in the left foot which is her symptomatic leg.  Again discussed I do not think her leg pain is related to significant arterial insufficiency.  We will continue follow-up and ongoing surveillance with repeat ABIs in 1 year.  I discussed walking therapies.  Her arterial disease is more significant in the right leg with an ABI of 0.8 but again denies any complaints in that leg to warrant intervention.  I was also asked to comment on her celiac and SMA stenosis.  This does not appear flow-limiting and they are both patent with mild disease based on last CTA from 08/2023.   Lonni DOROTHA Gaskins, MD [facility] Office: [phone]

## 2024-04-30 ENCOUNTER — Ambulatory Visit (INDEPENDENT_AMBULATORY_CARE_PROVIDER_SITE_OTHER)

## 2024-04-30 ENCOUNTER — Encounter: Payer: Self-pay | Admitting: Vascular Surgery

## 2024-04-30 ENCOUNTER — Ambulatory Visit: Admitting: Vascular Surgery

## 2024-04-30 VITALS — BP 128/73 | HR 116 | Temp 98.0°F | Resp 18 | Ht 62.0 in | Wt 103.0 lb

## 2024-04-30 DIAGNOSIS — I739 Peripheral vascular disease, unspecified: Secondary | ICD-10-CM

## 2024-04-30 LAB — VAS US ABI WITH/WO TBI
Left ABI: 0.89
Right ABI: 0.8

## 2024-05-13 DIAGNOSIS — R630 Anorexia: Secondary | ICD-10-CM | POA: Diagnosis not present

## 2024-05-13 DIAGNOSIS — J449 Chronic obstructive pulmonary disease, unspecified: Secondary | ICD-10-CM | POA: Diagnosis not present

## 2024-05-13 DIAGNOSIS — Z515 Encounter for palliative care: Secondary | ICD-10-CM | POA: Diagnosis not present

## 2024-05-13 DIAGNOSIS — M545 Low back pain, unspecified: Secondary | ICD-10-CM | POA: Diagnosis not present

## 2024-05-13 DIAGNOSIS — G8929 Other chronic pain: Secondary | ICD-10-CM | POA: Diagnosis not present

## 2024-05-13 DIAGNOSIS — R059 Cough, unspecified: Secondary | ICD-10-CM | POA: Diagnosis not present

## 2024-05-14 ENCOUNTER — Other Ambulatory Visit: Payer: Self-pay | Admitting: Gastroenterology

## 2024-05-22 ENCOUNTER — Other Ambulatory Visit: Payer: Self-pay | Admitting: Vascular Surgery

## 2024-06-04 ENCOUNTER — Ambulatory Visit: Admitting: Gastroenterology

## 2024-06-05 ENCOUNTER — Encounter: Payer: Self-pay | Admitting: Gastroenterology

## 2024-06-06 ENCOUNTER — Other Ambulatory Visit: Payer: Self-pay | Admitting: *Deleted

## 2024-06-06 ENCOUNTER — Encounter: Payer: Self-pay | Admitting: *Deleted

## 2024-06-06 ENCOUNTER — Ambulatory Visit: Admitting: Internal Medicine

## 2024-06-06 VITALS — BP 128/74 | HR 105 | Temp 98.2°F | Ht 62.0 in | Wt 101.4 lb

## 2024-06-06 DIAGNOSIS — K625 Hemorrhage of anus and rectum: Secondary | ICD-10-CM | POA: Diagnosis not present

## 2024-06-06 DIAGNOSIS — K529 Noninfective gastroenteritis and colitis, unspecified: Secondary | ICD-10-CM | POA: Diagnosis not present

## 2024-06-06 MED ORDER — PEG 3350-KCL-NA BICARB-NACL 420 G PO SOLR: 4000.0000 mL | Freq: Once | ORAL | 0 refills | Status: AC

## 2024-06-06 NOTE — H&P (View-Only) (Signed)
 Referring Provider: Jeanette Comer BRAVO, * Primary Care Physician:  Skillman, Katherine E, PA-C Primary GI:  Dr. Cindie  Chief Complaint  Patient presents with   Follow-up    Pt has had diarrhea for past 2 weeks    HPI:   Ashlee Austin is a 68 y.o. female who presents to clinic today for follow-up for chronic diarrhea.  Reports chronic diarrhea starting December 2024.  4-5 BMs daily.  Chronically loose.  Also reports associated urgency, occasional incontinent episodes.  Does not typically wake her up from sleep.   01/17/24 pancreatic elastase 10, GI profile positive for norovirus, fecal calprotectin 58 (borderline).  CTA 08/2023 with plaque at SMA origin with short segment mild stenosis but patent distally. Celiac ostial plaque resulting in short segment stenosis of mild severity, patent distally, IMA origin occulsion, patent distally. No abdominal pain  Colonoscopy 10/2020: non-bleeding internal hemorrhoids, diverticulosis, three 4-65mm polyps in transverse , tubular adenoma, 5-year recall.   EGD 10/2020: non-severe candidal esophagitis with no bleeding. Cells for cytology positive for candida, treated, gastritis, reactive gastropathy, no h.pylori  No response with Creon  or holding magnesium  and metformin . Failed Levsin . Imodium and lomotil not helpful.   Started on cholestyramine  powder 03/05/2024.  States this was a miracle drug for her.  Actually became constipated at first decreased dose to 2 g a day and then increase back up to 4 g a day.  States she was doing great until approximately 2 weeks ago when she started having diarrhea again.  Sometimes up to 10 bowel movements a day.  Also rectal bleeding x 2-3 episodes.  Past Medical History:  Diagnosis Date   Carotid artery disease    COPD (chronic obstructive pulmonary disease) (HCC)    Coronary atherosclerosis of native coronary artery    [facility]; negative Dobutamine echo 6/11   Depression    Essential hypertension     Hyperlipidemia    Hypothyroidism    Stroke (HCC) 2020   Stroke [facility]) 2014   Type 2 diabetes mellitus (HCC)     Past Surgical History:  Procedure Laterality Date   BIOPSY  10/20/2020   Procedure: BIOPSY;  Surgeon: Cindie Carlin POUR, DO;  [facility];  Service: Endoscopy;;   CARPAL TUNNEL RELEASE     COLONOSCOPY WITH PROPOFOL  N/A 10/20/2020   Procedure: COLONOSCOPY WITH PROPOFOL ;  Surgeon: Cindie Carlin POUR, DO;  [facility];  Service: Endoscopy;  Laterality: N/A;  pm (early as possible), diabetic   CYSTECTOMY     Spine   ESOPHAGOGASTRODUODENOSCOPY (EGD) WITH PROPOFOL  N/A 10/20/2020   Procedure: ESOPHAGOGASTRODUODENOSCOPY (EGD) WITH PROPOFOL ;  Surgeon: Cindie Carlin POUR, DO;  [facility];  Service: Endoscopy;  Laterality: N/A;   TOTAL ABDOMINAL HYSTERECTOMY  2004   TYMPANOPLASTY      Current Outpatient Medications  Medication Sig Dispense Refill   albuterol (VENTOLIN HFA) 108 (90 Base) MCG/ACT inhaler Inhale 2 puffs into the lungs every 4 (four) hours as needed.     ascorbic acid (VITAMIN C) 500 MG tablet Take 500 mg by mouth daily.     atorvastatin (LIPITOR) 80 MG tablet Take 80 mg by mouth at bedtime.     Cholecalciferol (VITAMIN D3) 10 MCG (400 UNIT) CAPS Take 1 capsule by mouth daily.     cholestyramine  (QUESTRAN ) 4 GM/DOSE powder TAKE 4 GRAMS DAILY. DO NOT TAKE WITHIN 3 HOURS OF OTHER MEDICATIONS. 378 g 3   Continuous Glucose Receiver (FREESTYLE LIBRE 3 READER) DEVI 1  PIECE BY DOES NOT APPLY ROUTE ONCE AS NEEDED FOR UP TO 1 DOSE. 1 each 0   Continuous Glucose Sensor (FREESTYLE LIBRE 3 PLUS SENSOR) MISC Change sensor every 15 days. 2 each 2   CREON  36000-114000 units CPEP capsule TAKE TWO CAPSULES WITH MEALS AND 1 CAPSULE WITH SNACKS, UP TO 8 CAPSULES DAILY 200 capsule 5   Cyanocobalamin  (VITAMIN B12 PO) Take 1 tablet by mouth daily.     DULoxetine (CYMBALTA) 60 MG capsule Take 60 mg by mouth daily.     EMBECTA PEN NEEDLE NANO 2 GEN 32G X 4 MM MISC       ENTRESTO 24-26 MG Take 1 tablet by mouth 2 (two) times daily.     glipiZIDE (GLUCOTROL XL) 5 MG 24 hr tablet Take 5 mg by mouth daily.     insulin  aspart protamine - aspart (NOVOLOG  70/30 FLEXPEN) (70-30) 100 UNIT/ML FlexPen Inject 25-30 Units into the skin 2 (two) times daily before a meal. 30 mL 1   metoprolol succinate (TOPROL-XL) 25 MG 24 hr tablet Take 25 mg by mouth daily.     Multiple Vitamin (MULTIVITAMIN WITH MINERALS) TABS tablet Take 1 tablet by mouth daily.     ondansetron  (ZOFRAN ) 4 MG tablet Take 4 mg by mouth every 8 (eight) hours as needed.     oxyCODONE (OXY IR/ROXICODONE) 5 MG immediate release tablet Take 5 mg by mouth every 8 (eight) hours as needed.     pantoprazole  (PROTONIX ) 40 MG tablet TAKE 1 TABLET(40 MG) BY MOUTH TWICE DAILY 60 tablet 5   Probiotic Product (PROBIOTIC PO) Take 1 tablet by mouth daily.     cilostazol  (PLETAL ) 100 MG tablet Take 100 mg by mouth 2 (two) times daily.     feeding supplement (ENSURE ENLIVE / ENSURE PLUS) LIQD Take 237 mLs by mouth 3 (three) times daily between meals. (Patient not taking: Reported on 06/06/2024) 237 mL 12   No current facility-administered medications for this visit.    Allergies as of 06/06/2024 - Review Complete 06/06/2024  Allergen Reaction Noted   Codeine Itching 03/31/2023    Family History  Problem Relation Age of Onset   Diabetes Mother    Coronary artery disease Mother        MI age 72   Heart attack Mother    Lung cancer Father    Diabetes Sister     Social History   Socioeconomic History   Marital status: Widowed    Spouse name: Not on file   Number of children: Not on file   Years of education: Not on file   Highest education level: Not on file  Occupational History    Employer: [employer]    Comment: Full time at [employer]  Tobacco Use   Smoking status: Every Day    Current packs/day: 0.00    Average packs/day: 1 pack/day for 47.7 years (47.7 ttl pk-yrs)    Types: Cigarettes     Start date: 01/31/1972    Last attempt to quit: 10/17/2019    Years since quitting: 4.6   Smokeless tobacco: Never  Vaping Use   Vaping status: Former  Substance and Sexual Activity   Alcohol  use: No   Drug use: No   Sexual activity: Not on file  Other Topics Concern   Not on file  Social History Narrative   Not on file   Social Drivers of Health   Financial Resource Strain: Low Risk (01/12/2023)   Received from [facility]  Overall Financial Resource Strain (CARDIA)    Difficulty of Paying Living Expenses: Not hard at all  Food Insecurity: No Food Insecurity (01/12/2023)   Received from [facility]   Hunger Vital Sign    Within the past 12 months, you worried that your food would run out before you got the money to buy more.: Never true    Within the past 12 months, the food you bought just didn't last and you didn't have money to get more.: Never true  Transportation Needs: No Transportation Needs (01/12/2023)   Received from [facility]   PRAPARE - Transportation    Lack of Transportation (Medical): No    Lack of Transportation (Non-Medical): No  Physical Activity: Inactive (09/19/2022)   Received from [facility]   Exercise Vital Sign    On average, how many days per week do you engage in moderate to strenuous exercise (like a brisk walk)?: 0 days    On average, how many minutes do you engage in exercise at this level?: 0 min  Stress: No Stress Concern Present (09/19/2022)   Received from [facility] - Occupational Stress Questionnaire    Feeling of Stress : Not at all  Social Connections: Moderately Isolated (09/19/2022)   Received from [facility]   Social Connection and Isolation Panel    In a typical week, how many times do you talk on the phone with family, friends, or neighbors?: Three times a week    How often do you get together with friends or relatives?: Never    How often do you attend church or  religious services?: More than 4 times per year    Do you belong to any clubs or organizations such as church groups, unions, fraternal or athletic groups, or school groups?: No    How often do you attend meetings of the clubs or organizations you belong to?: Never    Are you married, widowed, divorced, separated, never married, or living with a partner?: Widowed    Subjective: Review of Systems  Constitutional:  Negative for chills and fever.  HENT:  Negative for congestion and hearing loss.   Eyes:  Negative for blurred vision and double vision.  Respiratory:  Negative for cough and shortness of breath.   Cardiovascular:  Negative for chest pain and palpitations.  Gastrointestinal:  Negative for abdominal pain, blood in stool, constipation, diarrhea, heartburn, melena and vomiting.  Genitourinary:  Negative for dysuria and urgency.  Musculoskeletal:  Negative for joint pain and myalgias.  Skin:  Negative for itching and rash.  Neurological:  Negative for dizziness and headaches.  Psychiatric/Behavioral:  Negative for depression. The patient is not nervous/anxious.      Objective: BP 128/74   Pulse (!) 105   Temp 98.2 F (36.8 C)   Ht 5' 2 (1.575 m)   Wt 101 lb 6.4 oz (46 kg)   BMI 18.55 kg/m  Physical Exam Constitutional:      Appearance: Normal appearance.  HENT:     Head: Normocephalic and atraumatic.  Eyes:     Extraocular Movements: Extraocular movements intact.     Conjunctiva/sclera: Conjunctivae normal.  Cardiovascular:     Rate and Rhythm: Normal rate and regular rhythm.  Pulmonary:     Effort: Pulmonary effort is normal.     Breath sounds: Normal breath sounds.  Abdominal:     General: Bowel sounds are normal.     Palpations: Abdomen  is soft.  Musculoskeletal:        General: No swelling. Normal range of motion.     Cervical back: Normal range of motion and neck supple.  Skin:    General: Skin is warm and dry.     Coloration: Skin is not jaundiced.   Neurological:     General: No focal deficit present.     Mental Status: She is alert and oriented to person, place, and time.  Psychiatric:        Mood and Affect: Mood normal.        Behavior: Behavior normal.      Assessment: *Chronic diarrhea *Rectal bleeding  Plan: Discussed in depth with patient today.  Initial vast improvement on cholestyramine  4 g daily.  For the last 2 weeks she has had resurgence of her symptoms.  Perhaps this is an acute viral gastroenteritis as would be unusual for medication to just stop working all of a sudden.  For now would continue on cholestyramine  4 g daily.  Consider increasing dosage pending clinical course.  Given her rectal bleeding and chronicity of symptoms will schedule for colonoscopy to further evaluate.  Will perform biopsies to rule out microscopic colitis.  The risks including infection, bleed, or perforation as well as benefits, limitations, alternatives and imponderables have been reviewed with the patient. Questions have been answered. All parties agreeable.  Follow-up after colonoscopy.    06/06/2024 11:35 AM

## 2024-06-06 NOTE — Patient Instructions (Signed)
 Will schedule you for colonoscopy to further evaluate your rectal bleeding and chronic diarrhea.  Continue on cholestyramine  4 g daily.  It was very nice seeing you again today.  Dr. Cindie

## 2024-06-06 NOTE — Progress Notes (Signed)
 Referring Provider: Jeanette Comer BRAVO, * Primary Care Physician:  Skillman, Katherine E, PA-C Primary GI:  Dr. Cindie  Chief Complaint  Patient presents with   Follow-up    Pt has had diarrhea for past 2 weeks    HPI:   Ashlee Austin is a 68 y.o. female who presents to clinic today for follow-up for chronic diarrhea.  Reports chronic diarrhea starting December 2024.  4-5 BMs daily.  Chronically loose.  Also reports associated urgency, occasional incontinent episodes.  Does not typically wake her up from sleep.   01/17/24 pancreatic elastase 10, GI profile positive for norovirus, fecal calprotectin 58 (borderline).  CTA 08/2023 with plaque at SMA origin with short segment mild stenosis but patent distally. Celiac ostial plaque resulting in short segment stenosis of mild severity, patent distally, IMA origin occulsion, patent distally. No abdominal pain  Colonoscopy 10/2020: non-bleeding internal hemorrhoids, diverticulosis, three 4-65mm polyps in transverse , tubular adenoma, 5-year recall.   EGD 10/2020: non-severe candidal esophagitis with no bleeding. Cells for cytology positive for candida, treated, gastritis, reactive gastropathy, no h.pylori  No response with Creon  or holding magnesium  and metformin . Failed Levsin . Imodium and lomotil not helpful.   Started on cholestyramine  powder 03/05/2024.  States this was a miracle drug for her.  Actually became constipated at first decreased dose to 2 g a day and then increase back up to 4 g a day.  States she was doing great until approximately 2 weeks ago when she started having diarrhea again.  Sometimes up to 10 bowel movements a day.  Also rectal bleeding x 2-3 episodes.  Past Medical History:  Diagnosis Date   Carotid artery disease    COPD (chronic obstructive pulmonary disease) (HCC)    Coronary atherosclerosis of native coronary artery    [facility]; negative Dobutamine echo 6/11   Depression    Essential hypertension     Hyperlipidemia    Hypothyroidism    Stroke (HCC) 2020   Stroke [facility]) 2014   Type 2 diabetes mellitus (HCC)     Past Surgical History:  Procedure Laterality Date   BIOPSY  10/20/2020   Procedure: BIOPSY;  Surgeon: Cindie Carlin POUR, DO;  [facility];  Service: Endoscopy;;   CARPAL TUNNEL RELEASE     COLONOSCOPY WITH PROPOFOL  N/A 10/20/2020   Procedure: COLONOSCOPY WITH PROPOFOL ;  Surgeon: Cindie Carlin POUR, DO;  [facility];  Service: Endoscopy;  Laterality: N/A;  pm (early as possible), diabetic   CYSTECTOMY     Spine   ESOPHAGOGASTRODUODENOSCOPY (EGD) WITH PROPOFOL  N/A 10/20/2020   Procedure: ESOPHAGOGASTRODUODENOSCOPY (EGD) WITH PROPOFOL ;  Surgeon: Cindie Carlin POUR, DO;  [facility];  Service: Endoscopy;  Laterality: N/A;   TOTAL ABDOMINAL HYSTERECTOMY  2004   TYMPANOPLASTY      Current Outpatient Medications  Medication Sig Dispense Refill   albuterol (VENTOLIN HFA) 108 (90 Base) MCG/ACT inhaler Inhale 2 puffs into the lungs every 4 (four) hours as needed.     ascorbic acid (VITAMIN C) 500 MG tablet Take 500 mg by mouth daily.     atorvastatin (LIPITOR) 80 MG tablet Take 80 mg by mouth at bedtime.     Cholecalciferol (VITAMIN D3) 10 MCG (400 UNIT) CAPS Take 1 capsule by mouth daily.     cholestyramine  (QUESTRAN ) 4 GM/DOSE powder TAKE 4 GRAMS DAILY. DO NOT TAKE WITHIN 3 HOURS OF OTHER MEDICATIONS. 378 g 3   Continuous Glucose Receiver (FREESTYLE LIBRE 3 READER) DEVI 1  PIECE BY DOES NOT APPLY ROUTE ONCE AS NEEDED FOR UP TO 1 DOSE. 1 each 0   Continuous Glucose Sensor (FREESTYLE LIBRE 3 PLUS SENSOR) MISC Change sensor every 15 days. 2 each 2   CREON  36000-114000 units CPEP capsule TAKE TWO CAPSULES WITH MEALS AND 1 CAPSULE WITH SNACKS, UP TO 8 CAPSULES DAILY 200 capsule 5   Cyanocobalamin  (VITAMIN B12 PO) Take 1 tablet by mouth daily.     DULoxetine (CYMBALTA) 60 MG capsule Take 60 mg by mouth daily.     EMBECTA PEN NEEDLE NANO 2 GEN 32G X 4 MM MISC       ENTRESTO 24-26 MG Take 1 tablet by mouth 2 (two) times daily.     glipiZIDE (GLUCOTROL XL) 5 MG 24 hr tablet Take 5 mg by mouth daily.     insulin  aspart protamine - aspart (NOVOLOG  70/30 FLEXPEN) (70-30) 100 UNIT/ML FlexPen Inject 25-30 Units into the skin 2 (two) times daily before a meal. 30 mL 1   metoprolol succinate (TOPROL-XL) 25 MG 24 hr tablet Take 25 mg by mouth daily.     Multiple Vitamin (MULTIVITAMIN WITH MINERALS) TABS tablet Take 1 tablet by mouth daily.     ondansetron  (ZOFRAN ) 4 MG tablet Take 4 mg by mouth every 8 (eight) hours as needed.     oxyCODONE (OXY IR/ROXICODONE) 5 MG immediate release tablet Take 5 mg by mouth every 8 (eight) hours as needed.     pantoprazole  (PROTONIX ) 40 MG tablet TAKE 1 TABLET(40 MG) BY MOUTH TWICE DAILY 60 tablet 5   Probiotic Product (PROBIOTIC PO) Take 1 tablet by mouth daily.     cilostazol  (PLETAL ) 100 MG tablet Take 100 mg by mouth 2 (two) times daily.     feeding supplement (ENSURE ENLIVE / ENSURE PLUS) LIQD Take 237 mLs by mouth 3 (three) times daily between meals. (Patient not taking: Reported on 06/06/2024) 237 mL 12   No current facility-administered medications for this visit.    Allergies as of 06/06/2024 - Review Complete 06/06/2024  Allergen Reaction Noted   Codeine Itching 03/31/2023    Family History  Problem Relation Age of Onset   Diabetes Mother    Coronary artery disease Mother        MI age 72   Heart attack Mother    Lung cancer Father    Diabetes Sister     Social History   Socioeconomic History   Marital status: Widowed    Spouse name: Not on file   Number of children: Not on file   Years of education: Not on file   Highest education level: Not on file  Occupational History    Employer: [employer]    Comment: Full time at [employer]  Tobacco Use   Smoking status: Every Day    Current packs/day: 0.00    Average packs/day: 1 pack/day for 47.7 years (47.7 ttl pk-yrs)    Types: Cigarettes     Start date: 01/31/1972    Last attempt to quit: 10/17/2019    Years since quitting: 4.6   Smokeless tobacco: Never  Vaping Use   Vaping status: Former  Substance and Sexual Activity   Alcohol  use: No   Drug use: No   Sexual activity: Not on file  Other Topics Concern   Not on file  Social History Narrative   Not on file   Social Drivers of Health   Financial Resource Strain: Low Risk (01/12/2023)   Received from [facility]  Overall Financial Resource Strain (CARDIA)    Difficulty of Paying Living Expenses: Not hard at all  Food Insecurity: No Food Insecurity (01/12/2023)   Received from [facility]   Hunger Vital Sign    Within the past 12 months, you worried that your food would run out before you got the money to buy more.: Never true    Within the past 12 months, the food you bought just didn't last and you didn't have money to get more.: Never true  Transportation Needs: No Transportation Needs (01/12/2023)   Received from [facility]   PRAPARE - Transportation    Lack of Transportation (Medical): No    Lack of Transportation (Non-Medical): No  Physical Activity: Inactive (09/19/2022)   Received from [facility]   Exercise Vital Sign    On average, how many days per week do you engage in moderate to strenuous exercise (like a brisk walk)?: 0 days    On average, how many minutes do you engage in exercise at this level?: 0 min  Stress: No Stress Concern Present (09/19/2022)   Received from [facility] - Occupational Stress Questionnaire    Feeling of Stress : Not at all  Social Connections: Moderately Isolated (09/19/2022)   Received from [facility]   Social Connection and Isolation Panel    In a typical week, how many times do you talk on the phone with family, friends, or neighbors?: Three times a week    How often do you get together with friends or relatives?: Never    How often do you attend church or  religious services?: More than 4 times per year    Do you belong to any clubs or organizations such as church groups, unions, fraternal or athletic groups, or school groups?: No    How often do you attend meetings of the clubs or organizations you belong to?: Never    Are you married, widowed, divorced, separated, never married, or living with a partner?: Widowed    Subjective: Review of Systems  Constitutional:  Negative for chills and fever.  HENT:  Negative for congestion and hearing loss.   Eyes:  Negative for blurred vision and double vision.  Respiratory:  Negative for cough and shortness of breath.   Cardiovascular:  Negative for chest pain and palpitations.  Gastrointestinal:  Negative for abdominal pain, blood in stool, constipation, diarrhea, heartburn, melena and vomiting.  Genitourinary:  Negative for dysuria and urgency.  Musculoskeletal:  Negative for joint pain and myalgias.  Skin:  Negative for itching and rash.  Neurological:  Negative for dizziness and headaches.  Psychiatric/Behavioral:  Negative for depression. The patient is not nervous/anxious.      Objective: BP 128/74   Pulse (!) 105   Temp 98.2 F (36.8 C)   Ht 5' 2 (1.575 m)   Wt 101 lb 6.4 oz (46 kg)   BMI 18.55 kg/m  Physical Exam Constitutional:      Appearance: Normal appearance.  HENT:     Head: Normocephalic and atraumatic.  Eyes:     Extraocular Movements: Extraocular movements intact.     Conjunctiva/sclera: Conjunctivae normal.  Cardiovascular:     Rate and Rhythm: Normal rate and regular rhythm.  Pulmonary:     Effort: Pulmonary effort is normal.     Breath sounds: Normal breath sounds.  Abdominal:     General: Bowel sounds are normal.     Palpations: Abdomen  is soft.  Musculoskeletal:        General: No swelling. Normal range of motion.     Cervical back: Normal range of motion and neck supple.  Skin:    General: Skin is warm and dry.     Coloration: Skin is not jaundiced.   Neurological:     General: No focal deficit present.     Mental Status: She is alert and oriented to person, place, and time.  Psychiatric:        Mood and Affect: Mood normal.        Behavior: Behavior normal.      Assessment: *Chronic diarrhea *Rectal bleeding  Plan: Discussed in depth with patient today.  Initial vast improvement on cholestyramine  4 g daily.  For the last 2 weeks she has had resurgence of her symptoms.  Perhaps this is an acute viral gastroenteritis as would be unusual for medication to just stop working all of a sudden.  For now would continue on cholestyramine  4 g daily.  Consider increasing dosage pending clinical course.  Given her rectal bleeding and chronicity of symptoms will schedule for colonoscopy to further evaluate.  Will perform biopsies to rule out microscopic colitis.  The risks including infection, bleed, or perforation as well as benefits, limitations, alternatives and imponderables have been reviewed with the patient. Questions have been answered. All parties agreeable.  Follow-up after colonoscopy.    06/06/2024 11:35 AM

## 2024-06-10 DIAGNOSIS — L258 Unspecified contact dermatitis due to other agents: Secondary | ICD-10-CM | POA: Diagnosis not present

## 2024-06-10 DIAGNOSIS — L720 Epidermal cyst: Secondary | ICD-10-CM | POA: Diagnosis not present

## 2024-06-12 ENCOUNTER — Other Ambulatory Visit: Payer: Self-pay | Admitting: "Endocrinology

## 2024-06-12 DIAGNOSIS — N183 Chronic kidney disease, stage 3 unspecified: Secondary | ICD-10-CM

## 2024-06-24 ENCOUNTER — Ambulatory Visit: Admitting: Gastroenterology

## 2024-07-01 ENCOUNTER — Ambulatory Visit: Admitting: "Endocrinology

## 2024-07-03 ENCOUNTER — Encounter (HOSPITAL_COMMUNITY): Admission: RE | Admit: 2024-07-03 | Discharge: 2024-07-03 | Attending: Internal Medicine

## 2024-07-03 ENCOUNTER — Encounter (HOSPITAL_COMMUNITY): Payer: Self-pay

## 2024-07-04 DIAGNOSIS — R911 Solitary pulmonary nodule: Secondary | ICD-10-CM | POA: Diagnosis not present

## 2024-07-05 ENCOUNTER — Encounter (HOSPITAL_COMMUNITY): Admission: RE | Payer: Self-pay | Source: Home / Self Care

## 2024-07-05 ENCOUNTER — Ambulatory Visit (HOSPITAL_COMMUNITY): Admitting: Anesthesiology

## 2024-07-05 ENCOUNTER — Ambulatory Visit (HOSPITAL_COMMUNITY)
Admission: RE | Admit: 2024-07-05 | Discharge: 2024-07-05 | Disposition: A | Discharge status: Home / Self Care | Attending: Internal Medicine | Admitting: Internal Medicine

## 2024-07-05 DIAGNOSIS — I2510 Atherosclerotic heart disease of native coronary artery without angina pectoris: Secondary | ICD-10-CM | POA: Diagnosis not present

## 2024-07-05 DIAGNOSIS — K635 Polyp of colon: Secondary | ICD-10-CM | POA: Insufficient documentation

## 2024-07-05 DIAGNOSIS — K52832 Lymphocytic colitis: Secondary | ICD-10-CM | POA: Diagnosis not present

## 2024-07-05 DIAGNOSIS — K625 Hemorrhage of anus and rectum: Secondary | ICD-10-CM | POA: Insufficient documentation

## 2024-07-05 DIAGNOSIS — R197 Diarrhea, unspecified: Secondary | ICD-10-CM | POA: Diagnosis not present

## 2024-07-05 DIAGNOSIS — E1151 Type 2 diabetes mellitus with diabetic peripheral angiopathy without gangrene: Secondary | ICD-10-CM | POA: Insufficient documentation

## 2024-07-05 DIAGNOSIS — D120 Benign neoplasm of cecum: Secondary | ICD-10-CM

## 2024-07-05 DIAGNOSIS — J449 Chronic obstructive pulmonary disease, unspecified: Secondary | ICD-10-CM | POA: Diagnosis not present

## 2024-07-05 DIAGNOSIS — K648 Other hemorrhoids: Secondary | ICD-10-CM | POA: Insufficient documentation

## 2024-07-05 DIAGNOSIS — E039 Hypothyroidism, unspecified: Secondary | ICD-10-CM | POA: Insufficient documentation

## 2024-07-05 DIAGNOSIS — N289 Disorder of kidney and ureter, unspecified: Secondary | ICD-10-CM | POA: Insufficient documentation

## 2024-07-05 DIAGNOSIS — Z794 Long term (current) use of insulin: Secondary | ICD-10-CM | POA: Diagnosis not present

## 2024-07-05 DIAGNOSIS — D125 Benign neoplasm of sigmoid colon: Secondary | ICD-10-CM | POA: Diagnosis not present

## 2024-07-05 DIAGNOSIS — K5730 Diverticulosis of large intestine without perforation or abscess without bleeding: Secondary | ICD-10-CM | POA: Diagnosis not present

## 2024-07-05 DIAGNOSIS — F17210 Nicotine dependence, cigarettes, uncomplicated: Secondary | ICD-10-CM | POA: Diagnosis not present

## 2024-07-05 DIAGNOSIS — K219 Gastro-esophageal reflux disease without esophagitis: Secondary | ICD-10-CM | POA: Insufficient documentation

## 2024-07-05 DIAGNOSIS — I10 Essential (primary) hypertension: Secondary | ICD-10-CM | POA: Diagnosis not present

## 2024-07-05 DIAGNOSIS — Z7984 Long term (current) use of oral hypoglycemic drugs: Secondary | ICD-10-CM | POA: Insufficient documentation

## 2024-07-05 HISTORY — PX: COLONOSCOPY: SHX5424

## 2024-07-05 HISTORY — PX: POLYPECTOMY: SHX149

## 2024-07-05 LAB — GLUCOSE, CAPILLARY: Glucose-Capillary: 177 mg/dL — ABNORMAL HIGH (ref 70–99)

## 2024-07-05 SURGERY — COLONOSCOPY
Anesthesia: Monitor Anesthesia Care

## 2024-07-05 MED ORDER — PHENYLEPHRINE HCL (PRESSORS) 10 MG/ML IV SOLN
INTRAVENOUS | Status: DC | PRN
  Administered 2024-07-05: 100 ug via INTRAVENOUS

## 2024-07-05 MED ORDER — LACTATED RINGERS IV SOLN: INTRAVENOUS | Status: DC

## 2024-07-05 MED ORDER — LIDOCAINE HCL (CARDIAC) PF 100 MG/5ML IV SOSY
PREFILLED_SYRINGE | INTRAVENOUS | Status: DC | PRN
  Administered 2024-07-05: 30 mg via INTRAVENOUS

## 2024-07-05 MED ORDER — PROPOFOL 10 MG/ML IV BOLUS
INTRAVENOUS | Status: DC | PRN
  Administered 2024-07-05: 100 mg via INTRAVENOUS
  Administered 2024-07-05: 150 ug/kg/min via INTRAVENOUS

## 2024-07-05 NOTE — Discharge Instructions (Signed)
" °  Colonoscopy Discharge Instructions  Read the instructions outlined below and refer to this sheet in the next few weeks. These discharge instructions provide you with general information on caring for yourself after you leave the hospital. Your doctor may also give you specific instructions. While your treatment has been planned according to the most current medical practices available, unavoidable complications occasionally occur.   ACTIVITY You may resume your regular activity, but move at a slower pace for the next 24 hours.  Take frequent rest periods for the next 24 hours.  Walking will help get rid of the air and reduce the bloated feeling in your belly (abdomen).  No driving for 24 hours (because of the medicine (anesthesia) used during the test).   Do not sign any important legal documents or operate any machinery for 24 hours (because of the anesthesia used during the test).  NUTRITION Drink plenty of fluids.  You may resume your normal diet as instructed by your doctor.  Begin with a light meal and progress to your normal diet. Heavy or fried foods are harder to digest and may make you feel sick to your stomach (nauseated).  Avoid alcoholic beverages for 24 hours or as instructed.  MEDICATIONS You may resume your normal medications unless your doctor tells you otherwise.  WHAT YOU CAN EXPECT TODAY Some feelings of bloating in the abdomen.  Passage of more gas than usual.  Spotting of blood in your stool or on the toilet paper.  IF YOU HAD POLYPS REMOVED DURING THE COLONOSCOPY: No aspirin  products for 7 days or as instructed.  No alcohol  for 7 days or as instructed.  Eat a soft diet for the next 24 hours.  FINDING OUT THE RESULTS OF YOUR TEST Not all test results are available during your visit. If your test results are not back during the visit, make an appointment with your caregiver to find out the results. Do not assume everything is normal if you have not heard from your  caregiver or the medical facility. It is important for you to follow up on all of your test results.  SEEK IMMEDIATE MEDICAL ATTENTION IF: You have more than a spotting of blood in your stool.  Your belly is swollen (abdominal distention).  You are nauseated or vomiting.  You have a temperature over 101.  You have abdominal pain or discomfort that is severe or gets worse throughout the day.   Your colonoscopy revealed 4 polyp(s) which I removed successfully. Await pathology results, my office will contact you. I recommend repeating colonoscopy in 3 years for surveillance purposes, depending on pathology results.  Overall, your colon appeared very healthy.  I did not see any active inflammation indicative of underlying inflammatory bowel disease such as Crohn's disease or ulcerative colitis throughout your colon or end portion of your small bowel.  I took biopsies of your colon to further evaluate.  Await pathology results, my office will contact you.  You also have diverticulosis and internal hemorrhoids. I would recommend increasing fiber in your diet or adding OTC Benefiber/Metamucil. Be sure to drink at least 4 to 6 glasses of water daily. Follow-up with GI in 3 months   I hope you have a great rest of your week!  Ashlee Austin. Cindie, D.O. Gastroenterology and Hepatology [facility]  "

## 2024-07-05 NOTE — Anesthesia Preprocedure Evaluation (Signed)
"                                    Anesthesia Evaluation  Patient identified by MRN, date of birth, ID band Patient awake    Reviewed: Allergy & Precautions, H&P , NPO status , Patient's Chart, lab work & pertinent test results, reviewed documented beta blocker date and time   Airway Mallampati: II  TM Distance: >3 FB Neck ROM: full    Dental no notable dental hx.    Pulmonary COPD, Current Smoker   Pulmonary exam normal breath sounds clear to auscultation       Cardiovascular Exercise Tolerance: Good hypertension, + CAD and + Peripheral Vascular Disease   Rhythm:regular Rate:Normal     Neuro/Psych  PSYCHIATRIC DISORDERS Anxiety Depression     Neuromuscular disease CVA    GI/Hepatic Neg liver ROS,GERD  ,,  Endo/Other  diabetesHypothyroidism    Renal/GU Renal disease  negative genitourinary   Musculoskeletal   Abdominal   Peds  Hematology  (+) Blood dyscrasia, anemia   Anesthesia Other Findings   Reproductive/Obstetrics negative OB ROS                              Anesthesia Physical Anesthesia Plan  ASA: 3  Anesthesia Plan: MAC   Post-op Pain Management:    Induction:   PONV Risk Score and Plan: Propofol  infusion  Airway Management Planned:   Additional Equipment:   Intra-op Plan:   Post-operative Plan:   Informed Consent: I have reviewed the patients History and Physical, chart, labs and discussed the procedure including the risks, benefits and alternatives for the proposed anesthesia with the patient or authorized representative who has indicated his/her understanding and acceptance.     Dental Advisory Given  Plan Discussed with: CRNA  Anesthesia Plan Comments:         Anesthesia Quick Evaluation  "

## 2024-07-05 NOTE — Interval H&P Note (Signed)
 History and Physical Interval Note:  07/05/2024 9:46 AM  Ashlee Austin  has presented today for surgery, with the diagnosis of rb, chronic diarrhea.  The various methods of treatment have been discussed with the patient and family. After consideration of risks, benefits and other options for treatment, the patient has consented to  Procedures with comments: COLONOSCOPY (N/A) - 11:00am, asa 3 as a surgical intervention.  The patient's history has been reviewed, patient examined, no change in status, stable for surgery.  I have reviewed the patient's chart and labs.  Questions were answered to the patient's satisfaction.     Carlin MARLA Hasty

## 2024-07-05 NOTE — Transfer of Care (Signed)
 Immediate Anesthesia Transfer of Care Note  Patient: Ashlee Austin  Procedure(s) Performed: COLONOSCOPY POLYPECTOMY, INTESTINE  Patient Location: Short Stay  Anesthesia Type:General  Level of Consciousness: awake, alert , oriented, and patient cooperative  Airway & Oxygen Therapy: Patient Spontanous Breathing  Post-op Assessment: Report given to RN and Post -op Vital signs reviewed and stable  Post vital signs: Reviewed and stable  Last Vitals:  Vitals Value Taken Time  BP 105/68   Temp 97.6   Pulse 74   Resp 16   SpO2 94     Last Pain:  Vitals:   07/05/24 1013  TempSrc:   PainSc: 0-No pain      Patients Stated Pain Goal: 6 (07/05/24 0923)  Complications: No notable events documented.

## 2024-07-05 NOTE — Op Note (Signed)
 [facility] Patient Name: Ashlee Austin Procedure Date: 07/05/2024 10:01 AM MRN: [id number] Date of Birth: 1956/02/11 Attending MD: Carlin POUR. Cindie , [place], [id number] CSN: [id number] Age: 68 Admit Type: Outpatient Procedure:                Colonoscopy Indications:              Chronic diarrhea, Rectal bleeding Providers:                Carlin POUR. Cindie, DO, Tammy Vaught, RN, Bascom Blush Referring MD:              Medicines:                See the Anesthesia note for documentation of the                            administered medications Complications:            No immediate complications. Estimated Blood Loss:     Estimated blood loss was minimal. Procedure:                Pre-Anesthesia Assessment:                           - The anesthesia plan was to use monitored                            anesthesia care (MAC).                           After obtaining informed consent, the colonoscope                            was passed under direct vision. Throughout the                            procedure, the patient's blood pressure, pulse, and                            oxygen saturations were monitored continuously. The                            PCF-HQ190L ([id number]) Peds Colon was introduced                            through the anus and advanced to the the terminal                            ileum, with identification of the appendiceal                            orifice and IC valve. The colonoscopy was performed                            without difficulty. The patient tolerated the  procedure well. The quality of the bowel                            preparation was evaluated using the BBPS [facility]                            Bowel Preparation Scale) with scores of: Right                            Colon = 3, Transverse Colon = 3 and Left Colon = 3                            (entire mucosa seen well with no residual  staining,                            small fragments of stool or opaque liquid). The                            total BBPS score equals 9. Scope In: 10:17:50 AM Scope Out: 10:46:42 AM Scope Withdrawal Time: 0 hours 23 minutes 5 seconds  Total Procedure Duration: 0 hours 28 minutes 52 seconds  Findings:      Non-bleeding internal hemorrhoids were found.      A few small-mouthed diverticula were found in the sigmoid colon.      A 10-12 mm polyp was found in the cecum. The polyp was flat. The polyp       was removed with a cold snare. Resection and retrieval were complete.      Three hyperplastic polyps were found in the sigmoid colon. The polyps       were 4 to 5 mm in size. These polyps were removed with a cold snare.       Resection and retrieval were complete.      Biopsies for histology were taken with a cold forceps from the ascending       colon, transverse colon and descending colon for evaluation of       microscopic colitis.      The terminal ileum appeared normal. Impression:               - Non-bleeding internal hemorrhoids.                           - Diverticulosis in the sigmoid colon.                           - One 10-12 mm polyp in the cecum, removed with a                            cold snare. Resected and retrieved.                           - Three 4 to 5 mm polyps in the sigmoid colon,                            removed with a cold snare. Resected and retrieved.                           -  The examined portion of the ileum was normal.                           - Biopsies were taken with a cold forceps from the                            ascending colon, transverse colon and descending                            colon for evaluation of microscopic colitis. Moderate Sedation:      Per Anesthesia Care Recommendation:           - Patient has a contact number available for                            emergencies. The signs and symptoms of potential                             delayed complications were discussed with the                            patient. Return to normal activities tomorrow.                            Written discharge instructions were provided to the                            patient.                           - Resume previous diet.                           - Continue present medications.                           - Await pathology results.                           - Repeat colonoscopy in 3 years for surveillance.                           - Return to GI clinic in 3 months. Procedure Code(s):        --- Professional ---                           [phone], Colonoscopy, flexible; with removal of                            tumor(s), polyp(s), or other lesion(s) by snare                            technique                           45380, 59, Colonoscopy, flexible; with biopsy,  single or multiple Diagnosis Code(s):        --- Professional ---                           K64.8, Other hemorrhoids                           D12.0, Benign neoplasm of cecum                           D12.5, Benign neoplasm of sigmoid colon                           K52.9, Noninfective gastroenteritis and colitis,                            unspecified                           K62.5, Hemorrhage of anus and rectum                           K57.30, Diverticulosis of large intestine without                            perforation or abscess without bleeding CPT copyright 2022 [employer]. All rights reserved. The codes documented in this report are preliminary and upon coder review may  be revised to meet current compliance requirements. Carlin POUR. Cindie, DO Carlin POUR. Naleyah Ohlinger, DO 07/05/2024 10:51:47 AM This report has been signed electronically. Number of Addenda: 0

## 2024-07-07 NOTE — Anesthesia Postprocedure Evaluation (Signed)
"   Anesthesia Post Note  Patient: Ashlee Austin  Procedure(s) Performed: COLONOSCOPY POLYPECTOMY, INTESTINE  Patient location during evaluation: Phase II Anesthesia Type: MAC Level of consciousness: awake Pain management: pain level controlled Vital Signs Assessment: post-procedure vital signs reviewed and stable Respiratory status: spontaneous breathing and respiratory function stable Cardiovascular status: blood pressure returned to baseline and stable Postop Assessment: no headache and no apparent nausea or vomiting Anesthetic complications: no Comments: Late entry   No notable events documented.   Last Vitals:  Vitals:   07/05/24 0923 07/05/24 1050  BP: 133/87 115/69  Pulse: 93 88  Resp: 20 18  Temp: 36.6 C 36.4 C  SpO2: 100% 100%    Last Pain:  Vitals:   07/05/24 1050  TempSrc: Oral  PainSc: 0-No pain                 Yvonna PARAS [name]      "

## 2024-07-08 ENCOUNTER — Encounter (HOSPITAL_COMMUNITY): Payer: Self-pay | Admitting: Internal Medicine

## 2024-07-09 LAB — SURGICAL PATHOLOGY

## 2024-07-26 ENCOUNTER — Ambulatory Visit: Admitting: Internal Medicine

## 2024-08-15 ENCOUNTER — Ambulatory Visit: Admitting: "Endocrinology

## 2024-09-02 ENCOUNTER — Ambulatory Visit: Admitting: "Endocrinology
